# Patient Record
Sex: Female | Born: 1945 | Race: Black or African American | Hispanic: No | Smoking: Never smoker
Health system: Southern US, Community
[De-identification: ages and names within clinical notes are randomized; demographics above are authoritative.]

## PROBLEM LIST (undated history)

## (undated) DIAGNOSIS — J329 Chronic sinusitis, unspecified: Secondary | ICD-10-CM

## (undated) DIAGNOSIS — Z862 Personal history of diseases of the blood and blood-forming organs and certain disorders involving the immune mechanism: Secondary | ICD-10-CM

## (undated) DIAGNOSIS — I493 Ventricular premature depolarization: Secondary | ICD-10-CM

## (undated) DIAGNOSIS — G4485 Primary stabbing headache: Secondary | ICD-10-CM

## (undated) DIAGNOSIS — W19XXXA Unspecified fall, initial encounter: Secondary | ICD-10-CM

## (undated) DIAGNOSIS — E668 Other obesity: Secondary | ICD-10-CM

## (undated) DIAGNOSIS — Z8719 Personal history of other diseases of the digestive system: Secondary | ICD-10-CM

## (undated) DIAGNOSIS — E876 Hypokalemia: Secondary | ICD-10-CM

## (undated) DIAGNOSIS — Z86010 Personal history of colonic polyps: Secondary | ICD-10-CM

## (undated) DIAGNOSIS — K5281 Eosinophilic gastritis or gastroenteritis: Secondary | ICD-10-CM

## (undated) DIAGNOSIS — E0590 Thyrotoxicosis, unspecified without thyrotoxic crisis or storm: Secondary | ICD-10-CM

## (undated) DIAGNOSIS — I491 Atrial premature depolarization: Secondary | ICD-10-CM

## (undated) DIAGNOSIS — R634 Abnormal weight loss: Secondary | ICD-10-CM

## (undated) DIAGNOSIS — M25561 Pain in right knee: Secondary | ICD-10-CM

## (undated) DIAGNOSIS — K219 Gastro-esophageal reflux disease without esophagitis: Secondary | ICD-10-CM

## (undated) DIAGNOSIS — Z8701 Personal history of pneumonia (recurrent): Secondary | ICD-10-CM

## (undated) DIAGNOSIS — Z8659 Personal history of other mental and behavioral disorders: Secondary | ICD-10-CM

## (undated) DIAGNOSIS — J45909 Unspecified asthma, uncomplicated: Secondary | ICD-10-CM

## (undated) DIAGNOSIS — D573 Sickle-cell trait: Secondary | ICD-10-CM

## (undated) DIAGNOSIS — Z8639 Personal history of other endocrine, nutritional and metabolic disease: Secondary | ICD-10-CM

## (undated) DIAGNOSIS — F39 Unspecified mood [affective] disorder: Secondary | ICD-10-CM

## (undated) DIAGNOSIS — K52831 Collagenous colitis: Secondary | ICD-10-CM

## (undated) DIAGNOSIS — Z872 Personal history of diseases of the skin and subcutaneous tissue: Secondary | ICD-10-CM

## (undated) DIAGNOSIS — M8580 Other specified disorders of bone density and structure, unspecified site: Secondary | ICD-10-CM

## (undated) DIAGNOSIS — D571 Sickle-cell disease without crisis: Secondary | ICD-10-CM

## (undated) DIAGNOSIS — N393 Stress incontinence (female) (male): Secondary | ICD-10-CM

## (undated) DIAGNOSIS — E7800 Pure hypercholesterolemia, unspecified: Secondary | ICD-10-CM

## (undated) DIAGNOSIS — R6884 Jaw pain: Secondary | ICD-10-CM

## (undated) DIAGNOSIS — M76899 Other specified enthesopathies of unspecified lower limb, excluding foot: Secondary | ICD-10-CM

## (undated) DIAGNOSIS — H269 Unspecified cataract: Secondary | ICD-10-CM

## (undated) DIAGNOSIS — T7491XA Unspecified adult maltreatment, confirmed, initial encounter: Secondary | ICD-10-CM

## (undated) DIAGNOSIS — Z8249 Family history of ischemic heart disease and other diseases of the circulatory system: Secondary | ICD-10-CM

## (undated) DIAGNOSIS — M12819 Other specific arthropathies, not elsewhere classified, unspecified shoulder: Secondary | ICD-10-CM

## (undated) DIAGNOSIS — R202 Paresthesia of skin: Secondary | ICD-10-CM

## (undated) DIAGNOSIS — Z87898 Personal history of other specified conditions: Secondary | ICD-10-CM

## (undated) DIAGNOSIS — K52832 Lymphocytic colitis: Secondary | ICD-10-CM

## (undated) DIAGNOSIS — M6530 Trigger finger, unspecified finger: Secondary | ICD-10-CM

## (undated) DIAGNOSIS — Z9181 History of falling: Secondary | ICD-10-CM

## (undated) DIAGNOSIS — E785 Hyperlipidemia, unspecified: Secondary | ICD-10-CM

## (undated) DIAGNOSIS — D649 Anemia, unspecified: Secondary | ICD-10-CM

## (undated) DIAGNOSIS — G479 Sleep disorder, unspecified: Secondary | ICD-10-CM

## (undated) DIAGNOSIS — Z8669 Personal history of other diseases of the nervous system and sense organs: Secondary | ICD-10-CM

## (undated) DIAGNOSIS — I499 Cardiac arrhythmia, unspecified: Secondary | ICD-10-CM

## (undated) DIAGNOSIS — H20012 Primary iridocyclitis, left eye: Secondary | ICD-10-CM

## (undated) DIAGNOSIS — F4323 Adjustment disorder with mixed anxiety and depressed mood: Secondary | ICD-10-CM

## (undated) DIAGNOSIS — K449 Diaphragmatic hernia without obstruction or gangrene: Secondary | ICD-10-CM

## (undated) DIAGNOSIS — M25471 Effusion, right ankle: Secondary | ICD-10-CM

## (undated) DIAGNOSIS — F341 Dysthymic disorder: Secondary | ICD-10-CM

## (undated) DIAGNOSIS — M5030 Other cervical disc degeneration, unspecified cervical region: Secondary | ICD-10-CM

## (undated) HISTORY — DX: Atrial premature depolarization: I49.1

## (undated) HISTORY — DX: Sickle-cell disease without crisis: D57.1

## (undated) HISTORY — PX: BREAST SURGERY: SHX581

## (undated) HISTORY — DX: Personal history of other specified conditions: Z87.898

## (undated) HISTORY — DX: Lymphocytic colitis: K52.832

## (undated) HISTORY — DX: Paresthesia of skin: R20.2

## (undated) HISTORY — DX: Abnormal weight loss: R63.4

## (undated) HISTORY — DX: Personal history of diseases of the skin and subcutaneous tissue: Z87.2

## (undated) HISTORY — DX: Thyrotoxicosis, unspecified without thyrotoxic crisis or storm: E05.90

## (undated) HISTORY — DX: Ventricular premature depolarization: I49.3

## (undated) HISTORY — DX: Pure hypercholesterolemia, unspecified: E78.00

## (undated) HISTORY — DX: Personal history of pneumonia (recurrent): Z87.01

## (undated) HISTORY — PX: HERNIA REPAIR: SHX51

## (undated) HISTORY — DX: Primary iridocyclitis, left eye: H20.012

## (undated) HISTORY — DX: Unspecified mood (affective) disorder: F39

## (undated) HISTORY — DX: Sleep disorder, unspecified: G47.9

## (undated) HISTORY — DX: Anemia, unspecified: D64.9

## (undated) HISTORY — DX: Other specified enthesopathies of unspecified lower limb, excluding foot: M76.899

## (undated) HISTORY — DX: Primary stabbing headache: G44.85

## (undated) HISTORY — DX: Personal history of other mental and behavioral disorders: Z86.59

## (undated) HISTORY — PX: CATARACT EXTRACTION: SUR2

## (undated) HISTORY — DX: Adjustment disorder with mixed anxiety and depressed mood: F43.23

## (undated) HISTORY — DX: Jaw pain: R68.84

## (undated) HISTORY — DX: Sickle-cell trait: D57.3

## (undated) HISTORY — DX: Eosinophilic gastritis or gastroenteritis: K52.81

## (undated) HISTORY — DX: Pain in right knee: M25.561

## (undated) HISTORY — PX: TONSILLECTOMY: SUR1361

## (undated) HISTORY — DX: Personal history of other endocrine, nutritional and metabolic disease: Z86.39

## (undated) HISTORY — DX: Hypokalemia: E87.6

## (undated) HISTORY — DX: History of falling: Z91.81

## (undated) HISTORY — DX: Other cervical disc degeneration, unspecified cervical region: M50.30

## (undated) HISTORY — DX: Gastro-esophageal reflux disease without esophagitis: K21.9

## (undated) HISTORY — DX: Effusion, right ankle: M25.471

## (undated) HISTORY — DX: Unspecified asthma, uncomplicated: J45.909

## (undated) HISTORY — DX: Personal history of other diseases of the nervous system and sense organs: Z86.69

## (undated) HISTORY — DX: Personal history of diseases of the blood and blood-forming organs and certain disorders involving the immune mechanism: Z86.2

## (undated) HISTORY — DX: Other specific arthropathies, not elsewhere classified, unspecified shoulder: M12.819

## (undated) HISTORY — DX: Other specified disorders of bone density and structure, unspecified site: M85.80

## (undated) HISTORY — PX: TRIGGER FINGER RELEASE: SHX641

## (undated) HISTORY — DX: Stress incontinence (female) (male): N39.3

## (undated) HISTORY — PX: CARPAL TUNNEL RELEASE: SHX101

## (undated) HISTORY — DX: Trigger finger, unspecified finger: M65.30

## (undated) HISTORY — DX: Cardiac arrhythmia, unspecified: I49.9

## (undated) HISTORY — PX: APPENDECTOMY: SHX54

## (undated) HISTORY — DX: Collagenous colitis: K52.831

## (undated) HISTORY — DX: Other obesity: E66.8

## (undated) HISTORY — DX: Unspecified adult maltreatment, confirmed, initial encounter: T74.91XA

## (undated) HISTORY — DX: Unspecified cataract: H26.9

## (undated) HISTORY — DX: Chronic sinusitis, unspecified: J32.9

## (undated) HISTORY — DX: Dysthymic disorder: F34.1

## (undated) HISTORY — DX: Unspecified fall, initial encounter: W19.XXXA

## (undated) HISTORY — PX: GUM SURGERY: SHX658

## (undated) HISTORY — PX: EYE SURGERY: SHX253

## (undated) HISTORY — DX: Hyperlipidemia, unspecified: E78.5

## (undated) HISTORY — DX: Diaphragmatic hernia without obstruction or gangrene: K44.9

## (undated) HISTORY — DX: Personal history of colonic polyps: Z86.010

---
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
## 1973-09-17 HISTORY — PX: TUBAL LIGATION: SHX77

## 1986-09-17 HISTORY — PX: ABDOMINAL HYSTERECTOMY: SHX81

## 1986-09-17 HISTORY — PX: DILATION AND CURETTAGE OF UTERUS: SHX78

## 1994-07-06 ENCOUNTER — Encounter: Payer: Self-pay | Admitting: Family Medicine

## 1997-07-08 DIAGNOSIS — K449 Diaphragmatic hernia without obstruction or gangrene: Secondary | ICD-10-CM

## 1997-07-08 HISTORY — DX: Diaphragmatic hernia without obstruction or gangrene: K44.9

## 1997-09-17 HISTORY — PX: OTHER SURGICAL HISTORY: SHX169

## 1998-02-10 ENCOUNTER — Emergency Department (HOSPITAL_COMMUNITY): Admission: EM | Admit: 1998-02-10 | Discharge: 1998-02-10 | Payer: Self-pay | Admitting: Emergency Medicine

## 1998-04-18 ENCOUNTER — Ambulatory Visit (HOSPITAL_COMMUNITY): Admission: RE | Admit: 1998-04-18 | Discharge: 1998-04-18 | Payer: Self-pay | Admitting: Gastroenterology

## 1999-03-10 ENCOUNTER — Ambulatory Visit (HOSPITAL_COMMUNITY): Admission: RE | Admit: 1999-03-10 | Discharge: 1999-03-10 | Payer: Self-pay | Admitting: Cardiology

## 1999-03-11 ENCOUNTER — Encounter: Payer: Self-pay | Admitting: Family Medicine

## 1999-03-11 LAB — CONVERTED CEMR LAB
Cholesterol: 259 mg/dL
HDL: 56 mg/dL
LDL Cholesterol: 192 mg/dL
Triglycerides: 55 mg/dL

## 1999-09-29 ENCOUNTER — Encounter: Payer: Self-pay | Admitting: Obstetrics and Gynecology

## 1999-09-29 ENCOUNTER — Ambulatory Visit (HOSPITAL_COMMUNITY): Admission: RE | Admit: 1999-09-29 | Discharge: 1999-09-29 | Payer: Self-pay | Admitting: Obstetrics and Gynecology

## 2000-03-05 ENCOUNTER — Ambulatory Visit (HOSPITAL_BASED_OUTPATIENT_CLINIC_OR_DEPARTMENT_OTHER): Admission: RE | Admit: 2000-03-05 | Discharge: 2000-03-05 | Payer: Self-pay | Admitting: Orthopedic Surgery

## 2000-07-17 ENCOUNTER — Encounter: Payer: Self-pay | Admitting: Family Medicine

## 2000-07-17 LAB — CONVERTED CEMR LAB
HCT: 36.9 %
Hemoglobin: 12.3 g/dL
MCV: 90.6 fL
Platelets: 309 10*3/uL
WBC: 3.5 10*3/uL

## 2000-09-30 ENCOUNTER — Encounter: Payer: Self-pay | Admitting: Obstetrics and Gynecology

## 2000-09-30 ENCOUNTER — Ambulatory Visit (HOSPITAL_COMMUNITY): Admission: RE | Admit: 2000-09-30 | Discharge: 2000-09-30 | Payer: Self-pay | Admitting: Obstetrics and Gynecology

## 2000-10-08 ENCOUNTER — Encounter: Admission: RE | Admit: 2000-10-08 | Discharge: 2000-10-08 | Payer: Self-pay | Admitting: Obstetrics and Gynecology

## 2000-10-08 ENCOUNTER — Encounter: Payer: Self-pay | Admitting: Obstetrics and Gynecology

## 2000-10-17 ENCOUNTER — Encounter: Payer: Self-pay | Admitting: General Surgery

## 2000-10-17 ENCOUNTER — Other Ambulatory Visit: Admission: RE | Admit: 2000-10-17 | Discharge: 2000-10-17 | Payer: Self-pay | Admitting: General Surgery

## 2000-10-17 ENCOUNTER — Encounter: Admission: RE | Admit: 2000-10-17 | Discharge: 2000-10-17 | Payer: Self-pay | Admitting: General Surgery

## 2000-10-27 ENCOUNTER — Emergency Department (HOSPITAL_COMMUNITY): Admission: EM | Admit: 2000-10-27 | Discharge: 2000-10-27 | Payer: Self-pay | Admitting: Emergency Medicine

## 2001-05-09 ENCOUNTER — Ambulatory Visit (HOSPITAL_BASED_OUTPATIENT_CLINIC_OR_DEPARTMENT_OTHER): Admission: RE | Admit: 2001-05-09 | Discharge: 2001-05-09 | Payer: Self-pay | Admitting: Orthopedic Surgery

## 2001-05-09 DIAGNOSIS — M6530 Trigger finger, unspecified finger: Secondary | ICD-10-CM

## 2001-05-09 HISTORY — DX: Trigger finger, unspecified finger: M65.30

## 2001-06-25 ENCOUNTER — Ambulatory Visit (HOSPITAL_COMMUNITY): Admission: RE | Admit: 2001-06-25 | Discharge: 2001-06-25 | Payer: Self-pay | Admitting: Cardiology

## 2001-06-25 ENCOUNTER — Encounter: Payer: Self-pay | Admitting: Cardiology

## 2001-07-04 ENCOUNTER — Encounter: Payer: Self-pay | Admitting: Family Medicine

## 2001-07-04 LAB — CONVERTED CEMR LAB
HCT: 33.9 %
Hemoglobin: 11.8 g/dL
MCV: 91.7 fL
Platelets: 286 10*3/uL
WBC: 4.6 10*3/uL

## 2001-07-09 ENCOUNTER — Ambulatory Visit (HOSPITAL_COMMUNITY): Admission: RE | Admit: 2001-07-09 | Discharge: 2001-07-09 | Payer: Self-pay | Admitting: Cardiology

## 2001-09-17 HISTORY — PX: BIOPSY BREAST: PRO8

## 2001-09-30 ENCOUNTER — Ambulatory Visit (HOSPITAL_COMMUNITY): Admission: RE | Admit: 2001-09-30 | Discharge: 2001-09-30 | Payer: Self-pay | Admitting: Obstetrics and Gynecology

## 2001-09-30 ENCOUNTER — Encounter: Payer: Self-pay | Admitting: Obstetrics and Gynecology

## 2002-02-02 ENCOUNTER — Ambulatory Visit (HOSPITAL_COMMUNITY): Admission: RE | Admit: 2002-02-02 | Discharge: 2002-02-02 | Payer: Self-pay | Admitting: Emergency Medicine

## 2002-02-02 ENCOUNTER — Encounter: Payer: Self-pay | Admitting: Emergency Medicine

## 2002-08-18 ENCOUNTER — Encounter: Admission: RE | Admit: 2002-08-18 | Discharge: 2002-08-18 | Payer: Self-pay | Admitting: Emergency Medicine

## 2002-08-18 ENCOUNTER — Encounter: Payer: Self-pay | Admitting: Emergency Medicine

## 2002-10-01 ENCOUNTER — Ambulatory Visit (HOSPITAL_COMMUNITY): Admission: RE | Admit: 2002-10-01 | Discharge: 2002-10-01 | Payer: Self-pay | Admitting: Gastroenterology

## 2002-10-07 ENCOUNTER — Ambulatory Visit (HOSPITAL_COMMUNITY): Admission: RE | Admit: 2002-10-07 | Discharge: 2002-10-07 | Payer: Self-pay | Admitting: Obstetrics and Gynecology

## 2002-10-07 ENCOUNTER — Encounter: Payer: Self-pay | Admitting: Obstetrics and Gynecology

## 2002-10-28 ENCOUNTER — Encounter: Payer: Self-pay | Admitting: Gastroenterology

## 2002-10-28 ENCOUNTER — Encounter: Admission: RE | Admit: 2002-10-28 | Discharge: 2002-10-28 | Payer: Self-pay | Admitting: Gastroenterology

## 2003-04-29 ENCOUNTER — Encounter: Payer: Self-pay | Admitting: Gastroenterology

## 2003-04-29 ENCOUNTER — Encounter: Admission: RE | Admit: 2003-04-29 | Discharge: 2003-04-29 | Payer: Self-pay | Admitting: Gastroenterology

## 2003-10-11 ENCOUNTER — Ambulatory Visit (HOSPITAL_COMMUNITY): Admission: RE | Admit: 2003-10-11 | Discharge: 2003-10-11 | Payer: Self-pay | Admitting: Obstetrics and Gynecology

## 2004-07-19 ENCOUNTER — Encounter: Admission: RE | Admit: 2004-07-19 | Discharge: 2004-07-19 | Payer: Self-pay | Admitting: Emergency Medicine

## 2004-10-11 ENCOUNTER — Ambulatory Visit (HOSPITAL_COMMUNITY): Admission: RE | Admit: 2004-10-11 | Discharge: 2004-10-11 | Payer: Self-pay | Admitting: Obstetrics and Gynecology

## 2005-09-28 ENCOUNTER — Encounter: Admission: RE | Admit: 2005-09-28 | Discharge: 2005-09-28 | Payer: Self-pay | Admitting: Emergency Medicine

## 2005-10-11 DIAGNOSIS — M12819 Other specific arthropathies, not elsewhere classified, unspecified shoulder: Secondary | ICD-10-CM

## 2005-10-11 HISTORY — DX: Other specific arthropathies, not elsewhere classified, unspecified shoulder: M12.819

## 2005-10-18 ENCOUNTER — Ambulatory Visit (HOSPITAL_COMMUNITY): Admission: RE | Admit: 2005-10-18 | Discharge: 2005-10-18 | Payer: Self-pay | Admitting: Obstetrics and Gynecology

## 2006-05-27 ENCOUNTER — Encounter: Admission: RE | Admit: 2006-05-27 | Discharge: 2006-05-27 | Payer: Self-pay | Admitting: General Surgery

## 2006-05-30 ENCOUNTER — Ambulatory Visit (HOSPITAL_BASED_OUTPATIENT_CLINIC_OR_DEPARTMENT_OTHER): Admission: RE | Admit: 2006-05-30 | Discharge: 2006-05-30 | Payer: Self-pay | Admitting: General Surgery

## 2006-09-17 HISTORY — PX: COLONOSCOPY W/ BIOPSIES: SHX1374

## 2006-10-08 ENCOUNTER — Encounter: Payer: Self-pay | Admitting: Family Medicine

## 2006-10-08 LAB — CONVERTED CEMR LAB
ALT: 20 units/L
AST: 23 units/L
Albumin: 3.8 g/dL
Alkaline Phosphatase: 78 units/L
BUN: 8 mg/dL
Basophils Absolute: 0 10*3/uL
Basophils Relative: 0 %
Bilirubin, Direct: 0.1 mg/dL
CO2: 27 meq/L
Chloride: 105 meq/L
Creatinine, Ser: 0.41 mg/dL
Eosinophils Absolute: 0.1 10*3/uL
Eosinophils Relative: 3 %
Glucose, Bld: 78 mg/dL
HCT: 31.4 %
Hemoglobin: 10.8 g/dL
Indirect Bilirubin: 0.6 mg/dL
Lymphocytes Relative: 36 %
Lymphs Abs: 1.4 10*3/uL
MCV: 92.1 fL
Monocytes Absolute: 0.4 10*3/uL
Monocytes Relative: 11 %
Neutro Abs: 1.9 10*3/uL
Neutrophils Relative %: 50 %
Platelets: 327 10*3/uL
Potassium: 2.5 meq/L
RBC: 3.41 M/uL
RDW: 13.3 %
Sodium: 146 meq/L
TSH: 0.425 microintl units/mL
Total Bilirubin: 0.7 mg/dL
Total Protein: 6.6 g/dL
WBC: 3.7 10*3/uL

## 2006-10-11 ENCOUNTER — Encounter (INDEPENDENT_AMBULATORY_CARE_PROVIDER_SITE_OTHER): Payer: Self-pay | Admitting: *Deleted

## 2006-10-11 ENCOUNTER — Ambulatory Visit (HOSPITAL_COMMUNITY): Admission: RE | Admit: 2006-10-11 | Discharge: 2006-10-12 | Payer: Self-pay | Admitting: Gastroenterology

## 2006-10-11 DIAGNOSIS — Z8719 Personal history of other diseases of the digestive system: Secondary | ICD-10-CM | POA: Insufficient documentation

## 2006-10-22 ENCOUNTER — Ambulatory Visit (HOSPITAL_COMMUNITY): Admission: RE | Admit: 2006-10-22 | Discharge: 2006-10-22 | Payer: Self-pay | Admitting: Obstetrics and Gynecology

## 2006-10-30 ENCOUNTER — Encounter: Admission: RE | Admit: 2006-10-30 | Discharge: 2006-10-30 | Payer: Self-pay | Admitting: Obstetrics and Gynecology

## 2006-11-11 ENCOUNTER — Ambulatory Visit (HOSPITAL_COMMUNITY): Admission: RE | Admit: 2006-11-11 | Discharge: 2006-11-11 | Payer: Self-pay | Admitting: Emergency Medicine

## 2006-12-17 HISTORY — PX: CARDIAC CATHETERIZATION: SHX172

## 2006-12-26 ENCOUNTER — Ambulatory Visit (HOSPITAL_COMMUNITY): Admission: RE | Admit: 2006-12-26 | Discharge: 2006-12-26 | Payer: Self-pay | Admitting: Cardiology

## 2006-12-30 ENCOUNTER — Ambulatory Visit (HOSPITAL_COMMUNITY): Admission: RE | Admit: 2006-12-30 | Discharge: 2006-12-30 | Payer: Self-pay | Admitting: Cardiology

## 2007-01-01 ENCOUNTER — Ambulatory Visit (HOSPITAL_COMMUNITY): Admission: RE | Admit: 2007-01-01 | Discharge: 2007-01-01 | Payer: Self-pay | Admitting: Cardiology

## 2007-02-23 ENCOUNTER — Emergency Department (HOSPITAL_COMMUNITY): Admission: EM | Admit: 2007-02-23 | Discharge: 2007-02-23 | Payer: Self-pay | Admitting: Emergency Medicine

## 2007-09-24 ENCOUNTER — Ambulatory Visit (HOSPITAL_COMMUNITY): Admission: RE | Admit: 2007-09-24 | Discharge: 2007-09-24 | Payer: Self-pay | Admitting: Emergency Medicine

## 2007-10-31 ENCOUNTER — Ambulatory Visit (HOSPITAL_COMMUNITY): Admission: RE | Admit: 2007-10-31 | Discharge: 2007-10-31 | Payer: Self-pay | Admitting: Obstetrics and Gynecology

## 2007-11-01 ENCOUNTER — Emergency Department (HOSPITAL_COMMUNITY): Admission: EM | Admit: 2007-11-01 | Discharge: 2007-11-01 | Payer: Self-pay | Admitting: Emergency Medicine

## 2007-11-08 ENCOUNTER — Ambulatory Visit (HOSPITAL_COMMUNITY): Admission: RE | Admit: 2007-11-08 | Discharge: 2007-11-08 | Payer: Self-pay | Admitting: Emergency Medicine

## 2007-11-27 DIAGNOSIS — M5030 Other cervical disc degeneration, unspecified cervical region: Secondary | ICD-10-CM

## 2007-11-27 DIAGNOSIS — M47812 Spondylosis without myelopathy or radiculopathy, cervical region: Secondary | ICD-10-CM | POA: Insufficient documentation

## 2007-11-27 DIAGNOSIS — M47817 Spondylosis without myelopathy or radiculopathy, lumbosacral region: Secondary | ICD-10-CM | POA: Insufficient documentation

## 2007-11-27 HISTORY — DX: Other cervical disc degeneration, unspecified cervical region: M50.30

## 2007-12-18 ENCOUNTER — Encounter: Admission: RE | Admit: 2007-12-18 | Discharge: 2008-02-05 | Payer: Self-pay | Admitting: Neurosurgery

## 2008-06-08 DIAGNOSIS — Z87898 Personal history of other specified conditions: Secondary | ICD-10-CM

## 2008-06-08 HISTORY — DX: Personal history of other specified conditions: Z87.898

## 2008-11-26 ENCOUNTER — Ambulatory Visit (HOSPITAL_BASED_OUTPATIENT_CLINIC_OR_DEPARTMENT_OTHER): Admission: RE | Admit: 2008-11-26 | Discharge: 2008-11-26 | Payer: Self-pay | Admitting: Orthopedic Surgery

## 2009-01-19 ENCOUNTER — Ambulatory Visit (HOSPITAL_COMMUNITY): Admission: RE | Admit: 2009-01-19 | Discharge: 2009-01-19 | Payer: Self-pay | Admitting: Obstetrics and Gynecology

## 2009-03-01 ENCOUNTER — Encounter: Payer: Self-pay | Admitting: Family Medicine

## 2009-03-01 LAB — CONVERTED CEMR LAB: Pap Smear: NORMAL

## 2009-10-25 ENCOUNTER — Ambulatory Visit (HOSPITAL_BASED_OUTPATIENT_CLINIC_OR_DEPARTMENT_OTHER): Admission: RE | Admit: 2009-10-25 | Discharge: 2009-10-25 | Payer: Self-pay | Admitting: Orthopedic Surgery

## 2009-11-17 ENCOUNTER — Encounter: Payer: Self-pay | Admitting: Family Medicine

## 2009-11-17 DIAGNOSIS — E668 Other obesity: Secondary | ICD-10-CM

## 2009-11-17 DIAGNOSIS — Z872 Personal history of diseases of the skin and subcutaneous tissue: Secondary | ICD-10-CM

## 2009-11-17 DIAGNOSIS — Z8659 Personal history of other mental and behavioral disorders: Secondary | ICD-10-CM | POA: Insufficient documentation

## 2009-11-17 DIAGNOSIS — I493 Ventricular premature depolarization: Secondary | ICD-10-CM

## 2009-11-17 DIAGNOSIS — J329 Chronic sinusitis, unspecified: Secondary | ICD-10-CM

## 2009-11-17 DIAGNOSIS — Z8669 Personal history of other diseases of the nervous system and sense organs: Secondary | ICD-10-CM

## 2009-11-17 DIAGNOSIS — R002 Palpitations: Secondary | ICD-10-CM | POA: Insufficient documentation

## 2009-11-17 DIAGNOSIS — M76899 Other specified enthesopathies of unspecified lower limb, excluding foot: Secondary | ICD-10-CM

## 2009-11-17 DIAGNOSIS — Z862 Personal history of diseases of the blood and blood-forming organs and certain disorders involving the immune mechanism: Secondary | ICD-10-CM | POA: Insufficient documentation

## 2009-11-17 DIAGNOSIS — Z8639 Personal history of other endocrine, nutritional and metabolic disease: Secondary | ICD-10-CM | POA: Insufficient documentation

## 2009-11-17 DIAGNOSIS — K219 Gastro-esophageal reflux disease without esophagitis: Secondary | ICD-10-CM | POA: Insufficient documentation

## 2009-11-17 DIAGNOSIS — E669 Obesity, unspecified: Secondary | ICD-10-CM

## 2009-11-17 HISTORY — DX: Chronic sinusitis, unspecified: J32.9

## 2009-11-17 HISTORY — DX: Ventricular premature depolarization: I49.3

## 2009-11-17 HISTORY — DX: Other specified enthesopathies of unspecified lower limb, excluding foot: M76.899

## 2009-11-17 HISTORY — DX: Other obesity: E66.8

## 2009-11-17 HISTORY — DX: Obesity, unspecified: E66.9

## 2009-11-17 HISTORY — DX: Personal history of other mental and behavioral disorders: Z86.59

## 2009-11-17 HISTORY — DX: Personal history of diseases of the blood and blood-forming organs and certain disorders involving the immune mechanism: Z86.2

## 2009-11-17 HISTORY — DX: Personal history of diseases of the blood and blood-forming organs and certain disorders involving the immune mechanism: Z86.39

## 2009-11-17 HISTORY — DX: Gastro-esophageal reflux disease without esophagitis: K21.9

## 2009-11-17 HISTORY — DX: Personal history of diseases of the skin and subcutaneous tissue: Z87.2

## 2009-11-17 HISTORY — DX: Personal history of other diseases of the nervous system and sense organs: Z86.69

## 2009-11-21 ENCOUNTER — Ambulatory Visit: Payer: Self-pay | Admitting: Family Medicine

## 2009-11-21 DIAGNOSIS — Z862 Personal history of diseases of the blood and blood-forming organs and certain disorders involving the immune mechanism: Secondary | ICD-10-CM | POA: Insufficient documentation

## 2009-11-21 HISTORY — DX: Personal history of diseases of the blood and blood-forming organs and certain disorders involving the immune mechanism: Z86.2

## 2009-11-21 LAB — CONVERTED CEMR LAB
BUN: 10 mg/dL (ref 6–23)
CO2: 24 meq/L (ref 19–32)
Calcium: 8.9 mg/dL (ref 8.4–10.5)
Chloride: 106 meq/L (ref 96–112)
Creatinine, Ser: 0.57 mg/dL (ref 0.40–1.20)
Glucose, Bld: 88 mg/dL (ref 70–99)
HCT: 35.8 % — ABNORMAL LOW (ref 36.0–46.0)
Hemoglobin: 11.8 g/dL — ABNORMAL LOW (ref 12.0–15.0)
MCHC: 33 g/dL (ref 30.0–36.0)
MCV: 92.7 fL (ref 78.0–100.0)
Platelets: 325 10*3/uL (ref 150–400)
Potassium: 4.1 meq/L (ref 3.5–5.3)
RBC: 3.86 M/uL — ABNORMAL LOW (ref 3.87–5.11)
RDW: 12.8 % (ref 11.5–15.5)
Sodium: 140 meq/L (ref 135–145)
WBC: 4.2 10*3/uL (ref 4.0–10.5)

## 2009-11-22 ENCOUNTER — Encounter: Payer: Self-pay | Admitting: Family Medicine

## 2009-11-22 DIAGNOSIS — R519 Headache, unspecified: Secondary | ICD-10-CM | POA: Insufficient documentation

## 2009-11-22 DIAGNOSIS — E669 Obesity, unspecified: Secondary | ICD-10-CM | POA: Insufficient documentation

## 2009-11-22 DIAGNOSIS — R51 Headache: Secondary | ICD-10-CM | POA: Insufficient documentation

## 2010-01-23 ENCOUNTER — Ambulatory Visit (HOSPITAL_COMMUNITY): Admission: RE | Admit: 2010-01-23 | Discharge: 2010-01-23 | Payer: Self-pay | Admitting: Obstetrics and Gynecology

## 2010-03-21 ENCOUNTER — Encounter: Payer: Self-pay | Admitting: Family Medicine

## 2010-05-01 ENCOUNTER — Ambulatory Visit: Payer: Self-pay | Admitting: Family Medicine

## 2010-10-06 ENCOUNTER — Other Ambulatory Visit: Payer: Self-pay | Admitting: Obstetrics and Gynecology

## 2010-10-06 DIAGNOSIS — Z139 Encounter for screening, unspecified: Secondary | ICD-10-CM

## 2010-10-06 DIAGNOSIS — Z1231 Encounter for screening mammogram for malignant neoplasm of breast: Secondary | ICD-10-CM

## 2010-10-08 ENCOUNTER — Encounter: Payer: Self-pay | Admitting: Emergency Medicine

## 2010-10-08 ENCOUNTER — Encounter: Payer: Self-pay | Admitting: Obstetrics and Gynecology

## 2010-10-09 ENCOUNTER — Encounter: Payer: Self-pay | Admitting: Obstetrics and Gynecology

## 2010-10-19 NOTE — Assessment & Plan Note (Signed)
Summary: [name]   Vital Signs:  Patient profile:   65 year old female Height:      64 inches Weight:      180.2 pounds BMI:     31.04 Temp:     98.6 degrees F oral Pulse rate:   72 / minute BP sitting:   118 / 74  (left arm) Cuff size:   regular  Vitals Entered By: Garen Grams LPN (May 01, 2010 3:07 PM) CC: f/u Is Patient Diabetic? No Pain Assessment Patient in pain? no        CC:  f/u.  History of Present Illness: Statin induced myalgias Patient stopped Crestor last week under direction from cardilogist, Dr Garnette Scheuermann, b/c of generalized muscle aching.  The aching has improved at least 50% since stopping Crestor.  Pt believes this is the second time she has developed myalgias on a statin therapy.  SH: Mrs Alas retired from  [employer] system last week.  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     Habits & Providers  Alcohol-Tobacco-Diet     Alcohol drinks/day: 0     Tobacco Status: never  Current Medications (verified): 1)  Atenolol 25 Mg Tabs (Atenolol) .Marland Kitchen.. 1 Tablet By Mouth Once A Day 2)  Klor-Con M20 20 Meq Cr-Tabs (Potassium Chloride Crys Cr) .... 2 Tablets By Mouth in Morning and 2 Tablets By Mouth 3)  Aspir-Trin 325 Mg Tbec (Aspirin) .Marland Kitchen.. 1 Tablets By Mouth Daily 4)  Protonix 40 Mg Tbec (Pantoprazole Sodium) .Marland Kitchen.. 1 Tablet By Mouth As Needed 5)  Niaspan 500 Mg Cr-Tabs (Niacin (Antihyperlipidemic)) .Marland Kitchen.. 1 Tablet At Bedtime  Allergies (verified): No Known Drug Allergies  Past History:  Past medical history reviewed for relevance to current acute and chronic problems. Past surgical history reviewed for relevance to current acute and chronic problems.  Past Medical History: Reviewed history from 11/17/2009 and no changes required. Coronary Angiography (2002 [facility]): No coronary Artery Disease. Coronary Angiography (01/01/2007, Chanda Busing MD): patent coronary arteries. Normal LV function. 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  No masses or polyps.  No evidence of diverticulosis. Normal terminal ilium.  Tissue Transglutaminase Antibody (IgA) (10/17/2006) = <3 U/Ml ( normal) Hospitalization (1/25 - 10/12/06, Dr Isidor Holts service): Diarrhea and profound hypokalemia  Past Surgical History: Reviewed history from 11/17/2009 and no changes required. Releast of stenosing tenosynovitis of thumbs  D&C 1988 Hysterectomy (partial) 1988 BTL 1975 Eye surgery x2 Tonsilectomy Carpel Tunnel Surgeries (both wrists) Umbilical Hernia Surgery x 2 Oral gum surgery   Image-guided Breast Biopsy of maommographic nodule (09/2001) Gallbladder U/S with CCK-injection (1999): normal UGI barium (1999) normal  Social History: Lives with her husband, Cheral Almas 3 grown children 5 brothers and 4 sisters Occupation: retired, previously an Manufacturing engineer at [employer] No pets Education: college Heterosexual Owns a car Patient's Cell phone [phone] Husband's cell phone [phone] Renting home Never Smoked Drug use-no Alcohol use: 1-2 glasses wine per day Exercise: Yes, 5 days a week, walking, wieght lifting and dance Always wears seatbelts No hx of STD Sun exposure: rarely  Review of Systems  The patient denies weight loss, chest pain, peripheral edema, prolonged cough, melena, hematochezia, and severe indigestion/heartburn.    Physical Exam  General:  alert and well-developed.  obese. NAD Ears:  Normal TM's bilaterally Neck:  supple, no masses, and no thyromegaly.  No carotid bruits.  Lungs:  Normal respiratory effort, chest expands symmetrically. Lungs are clear to auscultation, no crackles or wheezes. Heart:  normal rate, regular rhythm, no murmur, no gallop, no rub, and no JVD.   Abdomen:  soft, non-tender, normal bowel sounds, no distention, no masses, no hepatomegaly, and no splenomegaly.   Pulses:  R dorsalis  pedis normal and L dorsalis pedis normal.   Extremities:  No peripheral edema Psych:  memory intact for recent and remote, normally interactive, good eye contact, not anxious appearing, and not depressed appearing.     Impression & Recommendations:  Problem # 1:  HYPERLIPIDEMIA (ICD-272.4) Assessment Comment Only  Intolerant of Statin (sencond episode). Switched to Niaspan by Dr Garnette Scheuermann (Card) Her updated medication list for this problem includes:    Niaspan 500 Mg Cr-tabs (Niacin (antihyperlipidemic)) .Marland Kitchen... 1 tablet at bedtime  Orders: [facility]- Est Level  2 ([address]109)  Problem # 2:  FAMILY HISTORY BREAST CANCER 1ST DEGREE RELATIVE <50 (ICD-V16.3) Assessment: Comment Only Up to date on screening mammography.  She gets her mammograms at [facility].   Problem # 3:  Screening Breast Cancer (ICD-V76.10) Normal Mammogram. Return for follow up Mammogram in 1 year  Complete Medication List: 1)  Atenolol 25 Mg Tabs (Atenolol) .Marland Kitchen.. 1 tablet by mouth once a day 2)  Klor-con M20 20 Meq Cr-tabs (Potassium chloride crys cr) .... 2 tablets by mouth in morning and 2 tablets by mouth 3)  Aspir-trin 325 Mg Tbec (Aspirin) .Marland Kitchen.. 1 tablets by mouth daily 4)  Protonix 40 Mg Tbec (Pantoprazole sodium) .Marland Kitchen.. 1 tablet by mouth as needed 5)  Niaspan 500 Mg Cr-tabs (Niacin (antihyperlipidemic)) .Marland Kitchen.. 1 tablet at bedtime  Hemoccult Next Due:  Not Indicated Last PAP:  normal (03/01/2009 10:41:28 AM) PAP Next Due:  3 yr Last Mammogram:  normal (01/19/2010 10:04:28 AM) Mammogram Next Due:  1 yr   Prevention & Chronic Care Immunizations   Influenza vaccine: Historical  (08/16/2009)   Influenza vaccine due: 08/16/2010    Tetanus booster: 05/04/2008: Tdap   Tetanus booster due: 05/04/2018    Pneumococcal vaccine: Not documented    H. zoster vaccine: 11/28/2005: Zostavax  Colorectal Screening   Hemoccult: Not documented   Hemoccult due: Not Indicated    Colonoscopy: abnormal  (10/11/2006)    Colonoscopy due: 10/11/2016  Other Screening   Pap smear: normal  (03/01/2009)   Pap smear due: 03/01/2012    Mammogram: Normal  (05/01/2010)   Mammogram due: 01/20/2011    DXA bone density scan: Not documented   Smoking status: never  (05/01/2010)  Lipids   Total Cholesterol: 259  (03/11/1999)   LDL: 192  (03/11/1999)   LDL Direct: Not documented   HDL: 56  (03/11/1999)   Triglycerides: 55  (03/11/1999)    SGOT (AST): 23  (10/08/2006)   SGPT (ALT): 20  (10/08/2006)   Alkaline phosphatase: 78  (10/08/2006)   Total bilirubin: 0.7  (10/08/2006)  Self-Management Support :   Personal Goals (by the next clinic visit) :      Personal LDL goal: 130  (11/21/2009)    Lipid self-management support: Written self-care plan, Education handout, Resources for patients handout  (11/21/2009)     Appended Document: [facility]    Clinical Lists Changes  Observations: Added new observation of SOCIAL HX: Lives with her husband, Cheral Almas 3 grown daughters 5 brothers and 4 sisters Occupation: retired, 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 diagnosesd at age 26 Coronary Artery Disease in Father, one brother  and in one sister Cerebral Aneurysm in one brother Stroke/TIA in Father,two brothers and one sister Asthma(+) in family Hyperlipidemia in family Hypertension in family Stomach cancer in family No FH of colon,  ovarian, uterine, prostate cancer  (05/11/2010 11:01) Added new observation of PAST SURG HX: Releast of stenosing tenosynovitis of thumbs  D&C 1988 Hysterectomy (partial) 1988 BTL 1975 Eye surgery x2 Tonsilectomy Carpel Tunnel Surgeries (both wrists) Umbilical Hernia Surgery x 2 Oral gum surgery  Image-guided Breast Biopsy of maommographic nodule (09/2001) Gallbladder U/S with CCK-injection (1999): normal UGI barium (1999) normal Carotid artery Dopplers (2010) was normal.  (05/11/2010 11:01) Added new observation of US CAROTID: normal:   (08/31/2009 11:03)       Past Surgical History:    Releast of stenosing tenosynovitis of thumbs     D&C 1988    Hysterectomy (partial) 1988    BTL 1975    Eye surgery x2    Tonsilectomy    Carpel Tunnel Surgeries (both wrists)    Umbilical Hernia Surgery x 2    Oral gum surgery        Image-guided Breast Biopsy of maommographic nodule (09/2001)    Gallbladder U/S with CCK-injection (1999): normal    UGI barium (1999) normal    Carotid artery Dopplers (2010) was normal.     Carotid Doppler  Procedure date:  08/31/2009  Findings:      normal:     Carotid Doppler  Procedure date:  08/31/2009  Findings:      normal:     Family History:    Father died age 13 with Acute MI        Family History Diabetes 1st degree relative: Father    Family History Breast cancer 1st degree relative: Mother died age 10 yearsw-old with breast cancer. diagnosesd at age 62    Coronary Artery Disease in Father, one brother  and in one sister    Cerebral Aneurysm in one brother    Stroke/TIA in Father,two brothers and one sister    Asthma(+) in family    Hyperlipidemia in family    Hypertension in family    Stomach cancer in family    No FH of colon, ovarian, uterine, prostate cancer  Social History:    Lives with her husband, Cheral Almas    3 grown daughters    5 brothers and 4 sisters    Occupation: retired, previously an Manufacturing engineer  at [employer]    No pets    Education: college    Heterosexual    Owns a car    Patient's Cell phone [phone]    Husband's cell phone [phone]    Renting home    Never Smoked    Drug use-no    Alcohol use: 1-2 glasses wine per day    Exercise: Yes, 5 days a week, walking, wieght lifting and dance    Always wears seatbelts    No hx of STD    Sun  exposure: rarely   Appended Document: Lab results from [facility] 05/03/10

## 2010-10-19 NOTE — Miscellaneous (Signed)
Summary: ROI  ROI   Imported By: Bradly Bienenstock 05/02/2010 16:41:34  _____________________________________________________________________  External Attachment:    Type:   Image     Comment:   External Document

## 2010-10-19 NOTE — Letter (Signed)
Summary: Lab results  [facility]  [address]   [place], [place] [address]   Phone: [phone]  Fax: [phone]    11/22/2009 MRN: [id number]  [address] [place], [place]  [address]  Dear Ms. SNOWBALL,  Your blood work from March 7th, 2011 shows normal potassium, electrolytes, glucose, and hemoglobin.  Remember to discuss getting your cholesterol checked with Dr Garnette Scheuermann and talk with him about whether you should be taking the Crestor medication to lower your cholesterol.   Sincerely,   Tawanna Cooler Wynona Duhamel MD [facility]  Appended Document: Lab results mailed.

## 2010-10-19 NOTE — Letter (Signed)
Summary: Appointment reminder letter  [facility]  [address]   [place], [place] [address]   Phone: [phone]  Fax: [phone]    03/21/2010 MRN: [id number]  [address] [place], [place]  [address]  Dear Ms. Ike Bene,  Just a reminder to call and schedule an appointment with Dr Mahina Salatino at the [facility] for August.  The appointment is for a check up.   Sincerely,   Tawanna Cooler Karman Veney MD [facility]  Appended Document: Appointment reminder letter mailed

## 2010-10-19 NOTE — Assessment & Plan Note (Signed)
Summary: np,df   Vital Signs:  Patient profile:   65 year old female Height:      64 inches Weight:      183 pounds BMI:     31.53 BSA:     1.89 Temp:     98.5 degrees F Pulse rate:   63 / minute BP sitting:   118 / 75  Vitals Entered By: Jone Baseman CMA (November 21, 2009 2:36 PM) CC: NEW PATIENT Is Patient Diabetic? No Pain Assessment Patient in pain? no        CC:  NEW PATIENT.  History of Present Illness: Current Problems:    ANEMIA, MILD, HX OF (ICD-V12.3) No DOE, no SOB, No near syncope or syncope. No excess fatigue.   Hx of PALPITATIONS, CHRONIC (ICD-785.1) & PREMATURE VENTRICULAR CONTRACTIONS (ICD-427.69) Taking atenolol 25 mg daily.  No sycope, dizziness, shortness or breath  Hx of CARPAL TUNNEL SYNDROME, BILATERAL, HX OF (ICD-V12.49) Recent carpel tunnel release about one month ago by Dr Teressa Senter.   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 She is not currently taking the Crestor. She palns to discuss whether or not to take it with her new cardiologist, Dr Garnette Scheuermann this week.  She will get her fasting Lipid panel with Dr Michaelle Copas office.   GASTROESOPHAGEAL REFLUX DISEASE (ICD-530.81) Currently without symptoms, but would like to have Nexium availble should symptoms develop     Hx of Asthma Had wheezing 1-2 months ago with URI iilness.  No current SOB, cough, wheezing.  She would like to have Albuterol MDI available for when she has wheezing with URIs.    Habits & Providers  Alcohol-Tobacco-Diet     Alcohol drinks/day: 1     Alcohol Counseling: not indicated; use of alcohol is not excessive or problematic  Alcohol type: wine     Tobacco Status: never  Exercise-Depression-Behavior     Does Patient Exercise: yes     Exercise Counseling: not indicated; exercise is adequate     Type of exercise: walking, weights, dance     Times/week: 5     Gynecologist: Dr Genice Rouge  Current Problems (verified): 1)  Family History Breast Cancer 1st Degree Relative <50  (ICD-V16.3) 2)  Family History Diabetes 1st Degree Relative  (ICD-V18.0) 3)  Hx of Trigger Finger, Right Thumb  (ICD-727.03) 4)  Arthritis, 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) 5)  Shoulder Impingement Syndrome, Left, Chronic  (ICD-726.2) 6)  Anal Fissure, Hx of  (ICD-V13.3) 7)  Hx of Palpitations, Chronic  (ICD-785.1) 8)  Premature Ventricular Contractions  (ICD-427.69) 9)  Hx of Closed Fracture of Lateral Malleolus, Right  (ICD-824.2) 10)  Lumbar Facet Arthropathy, Esp L3-l4, Nerverootimping L4 & L3  (ICD-721.3) 11)  Spondylosis, Lumbar  (ICD-721.3) 12)  Degenerative Disc Disease, Cervical Spine  (ICD-722.4) 13)  Spondylosis, Cervical  (ICD-721.0) 14)  Hx of Carpal Tunnel Syndrome, Bilateral, Hx of  (ICD-V12.49) 15)  Hx of Trochanteric Bursitis, Right  (ICD-726.5) 16)  Hx of Sinusitis  (ICD-473.9) 17)  Gastroesophageal Reflux Disease  (ICD-530.81) 18)  Lymphocytic Colitis, 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 1 Tablet By Mouth Once A Day 2)  Klor-Con M20 20 Meq Cr-Tabs (Potassium Chloride Crys Cr) .... 2 Tablets By Mouth in Morning and 2 Tablets By Mouth 3)  Aspir-Trin  325 Mg Tbec (Aspirin) .Marland Kitchen.. 1 Tablets By Mouth Daily 4)  Protonix 40 Mg Tbec (Pantoprazole Sodium) .Marland Kitchen.. 1 Tablet By Mouth As Needed 5)  Proair Hfa 108 (90 Base) Mcg/act Aers (Albuterol Sulfate) .... 2 Sprays Inhaled As Needed Every 4 To 6 Hours For Wheezing or Cough of Asthma  Allergies (verified): No Known Drug Allergies  Past History:  Past Medical History: Last updated: 12-12-09 Coronary Angiography (2002 [facility]): No coronary Artery Disease. Coronary Angiography (01/01/2007, Chanda Busing MD): patent coronary arteries. Normal LV function. 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  No masses or polyps.  No evidence of diverticulosis. Normal terminal ilium.  Tissue Transglutaminase Antibody (IgA) (10/17/2006) = <3 U/Ml ( normal) Hospitalization (1/25 - 10/12/06, Dr Isidor Holts service): Diarrhea and profound hypokalemia  Past Surgical History: Last updated: 12-12-09 Releast of stenosing tenosynovitis of thumbs  D&C 1988 Hysterectomy (partial) 1988 BTL 1975 Eye surgery x2 Tonsilectomy Carpel Tunnel Surgeries (both wrists) Umbilical Hernia Surgery x 2 Oral gum surgery   Image-guided Breast Biopsy of maommographic nodule (09/2001) Gallbladder U/S with CCK-injection (1999): normal UGI barium (1999) normal  Family History: Last updated: Dec 12, 2009 Father died age 65 with Acute MI  Family History Diabetes 1st degree relative: Father Family History Breast cancer 1st degree relative: Mother died age 59 yearsw-old with breast cancer. diagnosesd at age 49 Coronary Artery Disease in Father, one brother and in one sister Cerebral Aneurysm in one brother Stroke/TIA in Father, one brother and one sister Asthma(+) in family Hyperlipidemia in family Hypertension in family Stomach cancer in family No FH of colon, ovarian, uterine, prostate cancer  Social  History: Last updated: 11/21/2009 Lives with her husband, Cheral Almas 3 grown children 5 brothers and 4 sisters Occupation:EKG technician at [employer] No pets Education: college Heterosexual Owns a car Patient's Cell phone [phone] Husband's cell phone [phone] Renting home Never Smoked Drug use-no Alcohol use: 1-2 glasses wine per day Exercise: Yes, 5 days a week, walking, wieght lifting and dance Always wears seatbelts No hx of STD Sun exposure: rarely  Risk Factors: Alcohol Use: 1 (11/21/2009) Exercise: yes (11/21/2009)  Risk Factors: Smoking Status: never (11/21/2009)  Family History: Reviewed history from 12/12/2009 and no changes required. Father died age 4 with Acute MI  Family History Diabetes 1st degree relative: Father Family History Breast cancer 1st degree relative: Mother died age 71 yearsw-old with breast cancer. diagnosesd at age 76 Coronary Artery Disease in Father, one brother and in one sister Cerebral Aneurysm in one brother Stroke/TIA in Father, one brother and one sister Asthma(+) in family Hyperlipidemia in family Hypertension in family Stomach cancer in family No FH of colon, ovarian, uterine, prostate cancer  Social History: Reviewed history from 11/21/2009 and no changes required. Lives with her husband, Cheral Almas 3 grown children 5 brothers and 4 sisters Occupation:EKG technician at  [employer] No pets Education: college Heterosexual Owns a car Patient's Cell phone [phone] Husband's cell phone [phone] Renting home Never Smoked Drug use-no Alcohol use: 1-2 glasses wine per day Exercise: Yes, 5 days a week, walking, wieght lifting and dance Always wears seatbelts No hx of STD Sun exposure: rarely Smoking Status:  never Does Patient Exercise:  yes   Impression & Recommendations:  Problem # 1:  HYPERLIPIDEMIA (ICD-272.4) Assessment Comment Only Had taken Crestor 20 mg daily, then  decreased to twice a wek per Dr Truett Perna recommendations. She is not currently taking the Crestor. She palns to discuss whether or not to take it with her new cardiologist, Dr Garnette Scheuermann this week.  She will get her fasting Lipid panel with Dr Michaelle Copas office.P  Problem # 2:  Hx of PALPITATIONS, CHRONIC (ICD-785.1) Assessment: Comment Only Adequate control. Tolerating medication. No new organ damage. Plan to continue current medication.  Her updated medication list for this problem includes:    Atenolol 25 Mg Tabs (Atenolol) .Marland Kitchen... 1 tablet by mouth once a day  Problem # 3:  GASTROESOPHAGEAL REFLUX DISEASE (ICD-530.81) Assessment: Comment Only Refill Nexium. Tolerating medication without problems. Her updated medication list for this problem includes:    Protonix 40 Mg Tbec (Pantoprazole sodium) .Marland Kitchen... 1 tablet by mouth as needed  Problem # 4:  IRRITABLE BOWEL SYNDROME, HX OF (ICD-V12.79) Assessment: Comment Only Frequent loose watery stools that do not interfere with socail/occupational functioning  Problem # 5:  HYPOKALEMIA, HX OF (ICD-V12.2) Assessment: Comment Only Presumed secondary to IBS/Lymphocytic Colitis. K+ = 4.1 mEq/L today.  tolerating Klor-con supplements.  Will continue for Klor-con.   Problem # 6:  ANEMIA, MILD, HX OF (ICD-V12.3) Assessment: Comment Only Hgb 11.8 with MCV 98.  Will treat as normal range finding.  No diagnostics.  Orders: [facility] ([id number])  Problem # 7:  OBESITY (ICD-278.00) BMI 31%.  Need to discuss on next OV.  Complete Medication List: 1)  Atenolol 25 Mg Tabs (Atenolol) .Marland Kitchen.. 1 tablet by mouth once a day 2)  Klor-con M20 20 Meq Cr-tabs (Potassium chloride crys cr) .... 2 tablets by mouth in morning and 2 tablets by mouth 3)  Aspir-trin 325 Mg Tbec (Aspirin) .Marland Kitchen.. 1 tablets by mouth daily 4)  Protonix 40 Mg Tbec (Pantoprazole sodium) .Marland Kitchen.. 1 tablet by mouth as needed 5)  Proair Hfa 108 (90 Base) Mcg/act Aers (Albuterol sulfate) .... 2 sprays inhaled as  needed every 4 to 6 hours for wheezing or cough of asthma  Other Orders: Basic [facility] ([id number])  Patient Instructions: 1)  Please schedule a follow-up appointment in 4-6 months .  2)  Call if you have questions or concerns. 3)  Dr Kynedi Profitt will call you if any of your blood work is abnormal, other wise he will send you a letter about your blood work. 4)  Talk with Dr Garnette Scheuermann about measuring your cholesterol and whether you should restart your Crestor medication.  Prescriptions: PROAIR HFA 108 (90 BASE) MCG/ACT AERS (ALBUTEROL SULFATE) 2 sprays inhaled as needed every 4 to 6 hours for wheezing or cough of asthma  #1 x PRN   Entered and Authorized by:   Tawanna Cooler Tikisha Molinaro MD   Signed by:   Tawanna Cooler Almee Pelphrey MD on 11/21/2009   Method used:   Electronically to        [facility]* (retail)       [address]       [address] Shipping/mailing  [place], [place]  [address]       Ph: [phone]       Fax: [phone]   RxID:   [id number] PROTONIX 40 MG TBEC (PANTOPRAZOLE SODIUM) 1 tablet by mouth as needed  #30 x PRN   Entered and Authorized by:   Tawanna Cooler Joshuajames Moehring MD   Signed by:   Tawanna Cooler Laurajean Hosek MD on 11/21/2009   Method used:   Electronically to        [facility]* (retail)       [address]       [address] [place] [address]mailing       [place], [place]  [address]       Ph: [phone]       Fax: [phone]   RxID:   [id number] KLOR-CON M20 20 MEQ CR-TABS (POTASSIUM CHLORIDE CRYS CR) 2 tablets by mouth in morning and 2 tablets by mouth  #120 x 11   Entered and Authorized by:   Tawanna Cooler Chinenye Katzenberger MD   Signed by:   Tawanna Cooler Alieu Finnigan MD on 11/21/2009   Method used:   Electronically to        [facility]* (retail)       [address]       [address] [place] [address]       [place], [place]  [address]       Ph: [phone]       Fax: [phone]   RxID:   [id number] ATENOLOL 25 MG TABS (ATENOLOL) 1 tablet by  mouth once a day  #30 x 11   Entered and Authorized by:   Tawanna Cooler Nyema Hachey MD   Signed by:   Tawanna Cooler Elber Galyean MD on 11/21/2009   Method used:   Electronically to        [facility]* (retail)       [address]       [address] [place] [address]mailing       [place], [place]  [address]       Ph: [phone]       Fax: [phone]   RxID:   [id number]    Tetanus/Td Immunization History:    Tetanus/Td # 1:  Tdap (05/04/2008)  Influenza Immunization History:    Influenza # 1:  Historical (08/16/2009)    Prevention & Chronic Care Immunizations   Influenza vaccine: Historical  (08/16/2009)    Tetanus booster: 05/04/2008: Tdap    Pneumococcal vaccine: Not documented    H. zoster vaccine: 11/28/2005: Zostavax  Colorectal Screening   Hemoccult: Not documented    Colonoscopy: abnormal  (10/11/2006)   Colonoscopy due: 10/11/2016  Other Screening   Pap smear: normal  (03/01/2009)   Pap smear due: 03/02/2011    Mammogram: normal  (01/19/2010)   Mammogram due: 01/19/2010    DXA bone density scan: Not documented   Smoking status: never  (11/21/2009)  Lipids   Total Cholesterol: 259  (03/11/1999)   LDL: 192  (03/11/1999)   LDL Direct: Not documented   HDL: 56  (03/11/1999)   Triglycerides: 55  (03/11/1999)    SGOT (AST): 23  (10/08/2006)   SGPT (ALT): 20  (10/08/2006)   Alkaline phosphatase: 78  (10/08/2006)   Total bilirubin: 0.7  (10/08/2006)    Lipid flowsheet reviewed?: Yes   Progress toward LDL goal: Unchanged  Self-Management Support :   Personal Goals (by the next clinic visit) :      Personal LDL goal: 130  (11/21/2009)  Lipid self-management support: Written self-care plan, Education handout, Resources for patients handout  (11/21/2009)   Lipid self-care plan printed.   Lipid education handout printed      Resource handout printed.

## 2010-10-19 NOTE — Miscellaneous (Signed)
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) Added new problem of GASTROESOPHAGEAL REFLUX DISEASE (ICD-530.81) Added new problem of HYPERLIPIDEMIA (ICD-272.4) Added new problem of HYPOKALEMIA, HX OF (ICD-V12.2) Added new problem of IRRITABLE BOWEL SYNDROME, HX OF (ICD-V12.79) Added new problem of History of  SINUSITIS (ICD-473.9) Added new problem of History of  TROCHANTERIC BURSITIS, RIGHT (ICD-726.5) Added new problem of History of  CARPAL TUNNEL SYNDROME, BILATERAL, HX OF (ICD-V12.49) Added new problem of SPONDYLOSIS, CERVICAL (ICD-721.0) Added new problem of DEGENERATIVE DISC DISEASE, CERVICAL SPINE (ICD-722.4) Added new problem of SPONDYLOSIS, 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) Added new problem of LUMBAR FACET ARTHROPATHY, ESP L3-L4, NERVEROOTIMPING L4 & L3 (ICD-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.3) Added new problem of History of  CLOSED FRACTURE OF LATERAL MALLEOLUS, RIGHT (ICD-824.2) Added new problem of History of  PALPITATIONS, CHRONIC (ICD-785.1) Added new problem of ANAL FISSURE, HX OF (ICD-V13.3) Added new problem of SHOULDER IMPINGEMENT SYNDROME, LEFT, 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, 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) Added new problem of History of  TRIGGER FINGER, RIGHT THUMB (ICD-727.03) Added new problem of FAMILY HISTORY DIABETES 1ST DEGREE RELATIVE (ICD-V18.0) Added new problem of FAMILY HISTORY BREAST CANCER 1ST DEGREE RELATIVE <50 (ICD-V16.3) Added new problem of HIATAL HERNIA (ICD-553.3) - Dx on abdominal ultrasound Added new problem of ANEMIA, MILD, HX OF (ICD-V12.3) Added new problem of HEADACHE (ICD-784.0) Medications: Added new medication of ATENOLOL 25  MG TABS (ATENOLOL) 1 tablet by mouth once a day Added new medication of KLOR-CON M20 20 MEQ CR-TABS (POTASSIUM CHLORIDE CRYS CR) 2 tablets by mouth in morning and 2 tablets by mouth Added new medication of ASPIR-TRIN 325 MG TBEC (ASPIRIN) 1 tablets by mouth daily Observations: Added new observation of LAST MAM DAT: 01/19/2010 (01/19/2010 10:04) Added new observation of MAMMOGRAM: normal (01/19/2010 10:04) Added new observation of PAP DUE: 03/02/2011 (11/17/2009 17:02) Added new observation of FLEXSIGDUE: 03/01/2014 (11/17/2009 17:02) Added new observation of DM PROGRESS: N/A (11/17/2009 17:02) Added new observation of DM FSREVIEW: N/A (11/17/2009 17:02) Added new observation of HTN PROGRESS: N/A (11/17/2009 17:02) Added new observation of HTN FSREVIEW: N/A (11/17/2009 17:02) Added new observation of MAMMO DUE: 01/19/2010 (11/17/2009 17:02) Added new observation of FH BREAST CA: Family History Breast cancer 1st degree relative <50 (11/17/2009 17:02) Added new observation of FH DIABETES: Family History Diabetes 1st degree relative (11/17/2009 17:02) Added new observation of FAMILY HX: Father died age 63 with Acute MI  Family History Diabetes 1st degree relative: Father Family History Breast cancer 1st degree relative: Mother died age 25 yearsw-old with breast cancer. diagnosesd at age 32 Coronary Artery Disease in Father, one brother and in one sister Cerebral Aneurysm in one brother Stroke/TIA in Father, one brother and one sister Asthma(+) in family Hyperlipidemia in family Hypertension in family Stomach cancer in family No FH of colon, ovarian, uterine, prostate cancer  (11/17/2009 17:02) Added new observation of SOCIAL HX: Lives with her husband, Cheral Almas 3 grown children 5 brothers and 4 sisters Occupation:EKG Pensions consultant at [employer] No pets Education: college Heterosexual Owns a car Renting home Never Smoked Drug use-no Alcohol use: 1-2 glasses wine per  day Exercise: Yes, 5 days a week, walking, wieght lifting and dance Always wears seatbelts No hx of STD Sun exposure: rarely  (11/17/2009 17:02)  Added new observation of PAST SURG HX: Releast of stenosing tenosynovitis of thumbs  D&C 1988 Hysterectomy (partial) 1988 BTL 1975 Eye surgery x2 Tonsilectomy Carpel Tunnel Surgeries (both wrists) Umbilical Hernia Surgery x 2 Oral gum surgery   Image-guided Breast Biopsy of maommographic nodule (09/2001) Gallbladder U/S with CCK-injection (1999): normal UGI barium (1999) normal (11/17/2009 17:02) Added new observation of ENT MD: Serena Colonel, MD [facility]) (11/17/2009 17:02) Added new observation of COLONNXTDUE: 10/11/2016 (11/17/2009 17:02) Added new observation of PAST MED HX: Coronary Angiography (2002 [facility]): No coronary Artery Disease. Coronary Angiography (01/01/2007, Chanda Busing MD): patent coronary arteries. Normal LV function. 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  No masses or polyps.  No evidence of diverticulosis. Normal terminal ilium.  Tissue Transglutaminase Antibody (IgA) (10/17/2006) = <3 U/Ml ( normal) Hospitalization (1/25 - 10/12/06, Dr Isidor Holts service): Diarrhea and profound hypokalemia (11/17/2009 17:02) Added new observation of ORTHOPEDMD: Supple, MD [facility]) (11/17/2009 17:02) Added new observation of PTPROVIDER: [facility] (11/17/2009 17:02) Added new observation of NEUROSURGMD: Maeola Harman, MD (11/17/2009 17:02) Added new observation of OPHTHALMMD: Nile Riggs, MD (11/17/2009 17:02) Added new observation of CARDIO MD: Chanda Busing, MD (11/17/2009 17:02) Added new observation of [facility] MD: Elana Alm, MD (11/17/2009 17:02) Added new observation of GASTROENT MD: Anselmo Rod, MD (11/17/2009 17:02) Added  new observation of AUDIOLOGIST: Carroll Sage (11/17/2009 17:02) Added new observation of LAST PAP DAT: 03/01/2009 (03/01/2009 10:41) Added new observation of PAP SMEAR: normal (03/01/2009 10:41) Added new observation of LAST FLX SIG: 03/01/2009 (03/01/2009 10:41) Added new observation of FLEX SIGMOID: normal (03/01/2009 10:41) Added new observation of LAST MAM DAT: 01/19/2009 (01/19/2009 10:41) Added new observation of MAMMOGRAM: normal (01/19/2009 10:41) Added new observation of MRI: Exam Type: Lumbar Spine without contrast [facility]   (11/08/2007 10:06) Added new observation of CT OF HEAD: No acute intracranial abnormalities.  Head CT without contrast (09/23/2007 10:10) Added new observation of CT OF HEAD: Exam Type: Cervical spine MRI without constrast  (09/23/2007 10:09) Added new observation of LST COLON DT: 10/11/2006 (10/11/2006 9:32) Added new observation of COLONOSCOPY: abnormal (10/11/2006 9:32) Added new observation of TSH: 0.425 microintl units/mL (10/08/2006 17:02) Added new observation of ALBUMIN: 3.8 g/dL (16/06/9603 54:09) Added new observation of PROTEIN, TOT: 6.6 g/dL (81/19/1478 29:56) Added new observation of SGPT (ALT): 20 units/L (10/08/2006 17:02) Added new observation of SGOT (AST): 23 units/L (10/08/2006 17:02) Added new observation of ALK PHOS: 78 units/L (10/08/2006 17:02) Added new observation of BILI INDIREC: 0.6 mg/dL (21/30/8657 84:69) Added new observation of BILI DIRECT: 0.1 mg/dL (62/95/2841 32:44) Added new observation of BILI TOTAL: 0.7 mg/dL (09/19/7251 66:44) Added new observation of CREATININE: 0.41 mg/dL (03/47/4259 56:38) Added new observation of BUN: 8 mg/dL (75/64/3329 51:88) Added new observation of BG RANDOM: 78 mg/dL (41/66/0630 16:01) Added new observation of CO2 PLSM/SER: 27 meq/L (10/08/2006 17:02) Added new observation of CL SERUM: 105 meq/L (10/08/2006 17:02) Added new observation of K SERUM: 2.5 meq/L (10/08/2006  17:02) Added new observation of NA: 146 meq/L (10/08/2006 17:02) Added new observation of ABSOLUTE BAS: 0 K/uL (10/08/2006 17:02) Added new observation of BASOPHIL %: 0 % (10/08/2006 17:02) Added new observation of EOS ABSLT: 0.1 K/uL (10/08/2006 17:02) Added new observation of % EOS AUTO: 3 % (10/08/2006 17:02) Added new observation of ABSOLUTE MON: 0.4 K/uL (10/08/2006 17:02) Added new observation of MONOCYTE %:  11 % (10/08/2006 17:02) Added new observation of ABS LYMPHOCY: 1.4 K/uL (10/08/2006 17:02) Added new observation of LYMPHS %: 36 % (10/08/2006 17:02) Added new observation of ABS NEUTROPH: 1.9 K/uL (10/08/2006 17:02) Added new observation of PMN %: 50 % (10/08/2006 17:02) Added new observation of PLATELETK/UL: 327 K/uL (10/08/2006 17:02) Added new observation of RDW: 13.3 % (10/08/2006 17:02) Added new observation of MCV: 92.1 fL (10/08/2006 17:02) Added new observation of HCT: 31.4 % (10/08/2006 17:02) Added new observation of HGB: 10.8 g/dL (57/84/6962 95:28) Added new observation of RBC M/UL: 3.41 M/uL (10/08/2006 17:02) Added new observation of WBC COUNT: 3.7 10*3/microliter (10/08/2006 17:02) Added new observation of ZOSTAVAX: Zostavax  (11/28/2005 9:25) Added new observation of MRI: Exam Type:MRI Left Shoulder without contrast   (09/28/2005 10:11) Added new observation of OTHER X-RAY: Right Hip X-ray-07/19/2004 for hip pain  Clinical Data:   Right hip pain.   RIGHT HIP - 07/19/04:   Comparison:   None.   Findings:   Frontal pelvis was obtained with AP and frogleg lateral views of the   right hip.  There is no evidence for acute fracture involving the   right hip.  There is no substantial degenerative change.   A 17 mm round calcification projects over the left anatomic pelvis.     (07/19/2004 10:13) Added new observation of OTHER X-RAY: REPORT:  CLINICAL DATA:  65 YEAR OLD WITH BILATERAL HIP   PAIN WHICH DOES RADIATE INTO BOTH KNEES.  BILATERAL HIPS INCLUDING AP  PELVIS  NO COMPARISON.  THERE IS NO EVIDENCE OF ACUTE FRACTURE OR   DISLOCATION.  THE JOINT SPACES IN BOTH HIPS ARE WELL   PRESERVED.  THERE ARE MILD DEGENERATIVE CHANGES IN THE   RIGHT SACROILIAC JOINT.   IMPRESSION:  NORMAL APPEARING HIPS BILATERALLY.  MILD   DEGENERATIVE CHANGES IN THE RIGHT SACROILIAC JOINT.   RIGHT FEMUR (2 VIEWS):  THERE IS NO EVIDENCE OF FRACTURE OR FOCAL BONE SKELETAL   ABNORMALITIES. LESIONS. NO   OTHER SIGNIFICANT BONE OR SOFT TISSUE ABNORMALITIES ARE   IDENTIFIED.   IMPRESSION:  NORMAL STUDY.  (08/18/2002 10:15) Added new observation of MRI BRAIN: Exam Type:MRI and MRA brain with and without contrast medium  Brain MRI MPRESSION:  1.  NO SIGNAL ABNORMALITIES IN THE BRAIN   SUBSTANCE OR PATHOLOGICAL INTRACRANIAL ENHANCEMENT ARE  NOTED.   2.  MILD SINUSITIS CHANGES IN THE ETHMOID AIR CELLS.  Brain MRA IMPRESSION   1.  NO EVIDENCE OF ANEURYSM IS NOTED ON THE IMAGES   PROVIDED.  PLEASE NOTE THAT ANEURYSMS 3-5 MM MAY BE BEYOND THE RESOLUTION OF AN MRA EXAMINATION.   2.  NO VASCULAR ANOMALIES, OCCLUSIONS OR STENOSES ARE  SEEN, EITHER.   (02/02/2002 10:17) Added new observation of PLATELETK/UL: 286 K/uL (07/04/2001 17:02) Added new observation of MCV: 91.7 fL (07/04/2001 17:02) Added new observation of HCT: 33.9 % (07/04/2001 17:02) Added new observation of HGB: 11.8 g/dL (41/32/4401 02:72) Added new observation of WBC COUNT: 4.6 10*3/microliter (07/04/2001 17:02) Added new observation of PLATELETK/UL: 309 K/uL (07/17/2000 17:02) Added new observation of MCV: 90.6 fL (07/17/2000 17:02) Added new observation of HCT: 36.9 % (07/17/2000 17:02) Added new observation of HGB: 12.3 g/dL (53/66/4403 47:42) Added new observation of WBC COUNT: 3.5 10*3/microliter (07/17/2000 17:02) Added new observation of LDL: 192 mg/dL (59/56/3875 64:33) Added new observation of HDL: 56 mg/dL (29/51/8841 66:06) Added new observation of TRIGLYC TOT: 55 mg/dL (30/16/0109  32:35) Added new observation of CHOLESTEROL: 259 mg/dL (57/32/2025 42:70)  Habits & Providers     Audiologist: Carroll Sage     Cardiologist: Chanda Busing, MD     ENT: Serena Colonel, MD [facility])     Gastroenterologist: Anselmo Rod, MD     Gynecologist: Elana Alm, MD     Neurosurgeon: Maeola Harman, MD     Ophthalmologist: Nile Riggs, MD     Orthopedist: Rennis Chris, MD [facility])     Physical Therapist: [facility]    Other Immunization History:    Zostavax # 1:  Zostavax (11/28/2005)    Family History:    Father died age 54 with Acute MI        Family History Diabetes 1st degree relative: Father    Family History Breast cancer 1st degree relative: Mother died age 61 yearsw-old with breast cancer. 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): No coronary Artery Disease. Coronary Angiography (01/01/2007, Chanda Busing MD): patent coronary arteries. Normal LV function. Colonoscopy to cecum  (09/2006)  Arty Baumgartner, MD): Dx with Lymphocytic Colitis on  pathology report of colonic mucosal biopsy.  No masses or polyps.  No evidence of diverticulosis. Normal terminal ilium.  Tissue Transglutaminase Antibody (IgA) (10/17/2006) = <3 U/Ml ( normal) Hospitalization (1/25 - 10/12/06, Dr Isidor Holts service): Diarrhea and profound hypokalemia  Past Surgical History: Releast of stenosing tenosynovitis of thumbs  D&C 1988 Hysterectomy (partial) 1988 BTL 1975 Eye surgery x2 Tonsilectomy Carpel Tunnel Surgeries (both wrists) Umbilical Hernia Surgery x 2 Oral gum surgery   Image-guided Breast Biopsy of maommographic nodule (09/2001) Gallbladder U/S with CCK-injection (1999): normal UGI barium (1999) normal   Flex Sig Result:  normal Colonoscopy Result Date:  10/11/2006 Colonoscopy Result:  abnormal Colonoscopy Next Due:  10 yr PAP Result Date:  03/01/2009 PAP Result:  normal PAP Next Due:  2 yr Mammogram Result Date:  01/19/2009 Mammogram Result:  normal Mammogram Next Due:  1 yr    MRI EXAM  Procedure date:  11/08/2007  Findings:      Exam Type: Lumbar Spine without contrast [facility]    Comments:       IMPRESSION:   1.  Multilevel disk disease with shallow broad-based disk protrusions  at L2-3, L3-4, and L4-5.  However, the spinal canal is quite   generous.  There is no significant neural compression.  There is mild   foraminal encroachment at multiple levels without direct neural   compression of the exiting nerve roots.  There may be mild   extraforaminal encroachment on the left L2 nerve root and also on the   right L5 nerve root. Read by Rudie Meyer, MD   CT Brain  Procedure date:  09/23/2007  Findings:      Exam Type: Cervical spine MRI without constrast   Comments:      IMPRESSION:   1.  Degenerative cervical spondylosis with degenerative disc disease   and degenerative facet disease.  There are broad based bulging   degenerated discs along with disc protrusions and osteophytic and   uncinate  spurring contributing to spinal and foraminal stenosis as   specifically described above.   2.  No acute bony findings and normal MR appearance of the cervical   spinal cord.    Read By:  Cyndie Chime,  M.D.  CT Brain  Procedure date:  09/23/2007  Findings:      No acute intracranial abnormalities.  Head CT without contrast  MRI EXAM  Procedure date:  09/28/2005  Findings:      Exam Type:MRI Left Shoulder without contrast   Comments:       IMPRESSION:   1.  Moderate supraspinatus tendinopathy, with a partial bursal   surface tear of the distal supraspinatus but no definite full   thickness rotator cuff tear.   2.  Subacromial subdeltoid bursitis.   3.  The biceps tendon appears intact.   4.  Mild infraspinatus tendinopathy.    Read By:  Dellia Cloud,  M.D.  X-ray Musculoskeletal  Procedure date:  07/19/2004  Findings:      Right Hip X-ray-07/19/2004 for hip pain  Clinical Data:   Right hip pain.   RIGHT HIP - 07/19/04:   Comparison:   None.   Findings:   Frontal pelvis was obtained with AP and frogleg lateral views of the   right hip.  There is no evidence for acute fracture involving the   right hip.  There is no substantial degenerative change.   A 17 mm round calcification projects over the left anatomic pelvis.     Comments:       IMPRESSION:   1.  No acute bony abnormality in the right hip. There is no   substantial osteoarthritic change.   2.  17 mm eggshell calcification in the left anatomic pelvis is   indeterminate.  This is larger than would be expected for a   phlebolith.  This may be related to the left ovary, calcified colonic   diverticulum or stone within a bladder diverticulum.  X-ray Musculoskeletal  Procedure date:  08/18/2002  Findings:      REPORT:  CLINICAL DATA:  65 YEAR OLD WITH BILATERAL HIP   PAIN WHICH DOES RADIATE INTO BOTH KNEES.  BILATERAL HIPS INCLUDING AP PELVIS  NO COMPARISON.  THERE IS NO EVIDENCE OF ACUTE  FRACTURE OR   DISLOCATION.  THE JOINT SPACES IN BOTH HIPS ARE WELL   PRESERVED.  THERE ARE MILD DEGENERATIVE CHANGES IN THE   RIGHT SACROILIAC JOINT.   IMPRESSION:  NORMAL APPEARING HIPS BILATERALLY.  MILD   DEGENERATIVE CHANGES IN THE RIGHT SACROILIAC JOINT.   RIGHT FEMUR (2 VIEWS):  THERE IS NO EVIDENCE OF FRACTURE OR FOCAL BONE SKELETAL   ABNORMALITIES. LESIONS. NO   OTHER SIGNIFICANT BONE OR SOFT TISSUE ABNORMALITIES ARE   IDENTIFIED.   IMPRESSION:  NORMAL STUDY.  Comments:       LEFT FEMUR (2 VIEWS):  THERE IS NO EVIDENCE OF FRACTURE OR FOCAL BONE LESIONS. NO   OTHER SIGNIFICANT BONE OR SOFT TISSUE ABNORMALITIES ARE   IDENTIFIED.   IMPRESSION:  NORMAL STUDY.   RIGHT KNEE (2 VIEWS)  MILD JOINT SPACE NARROWING IS NOTED IN THE PATELLOFEMORAL  COMPARTMENT.  THE MEDIAL AND LATERAL COMPARTMENTS ARE WELL   PRESERVED, ALTHOUGH THERE ARE MILD HYPERTROPHIC CHANGES   LATERALLY.  THERE IS NO EVIDENCE OF SIGNIFICANT EFFUSION.   THERE IS NO EVIDENCE OF ACUTE FRACTURE.   IMPRESSION  MILD DEGENERATIVE CHANGES AS DESCRIBED. NO ACUTE    LEFT KNEE (2 VIEWS)  JOINT SPACE NARROWING IS NOTED IN THE PATELLOFEMORAL   COMPARTMENT AND TO A LESSER DEGREE THE LATERAL   COMPARTMENT.  THERE ARE ALSO HYPERTROPHIC CHANGES   LATERALLY AND MEDIALLY.  THERE IS NO SIGNIFICANT JOINT   EFFUSION.  THERE IS NO EVIDENCE OF ACUTE FRACTURE OR   DISLOCATION.   IMPRESSION  MODERATE DEGENERATIVE CHANGES AS DESCRIBED.  NO ACUTE   SKELETAL ABNORMALITIES.  MRI Brain  Procedure date:  02/02/2002  Findings:      Exam Type:MRI and MRA brain with and without contrast medium  Brain MRI MPRESSION:  1.  NO SIGNAL ABNORMALITIES IN THE BRAIN   SUBSTANCE OR PATHOLOGICAL INTRACRANIAL ENHANCEMENT ARE  NOTED.   2.  MILD SINUSITIS CHANGES IN THE ETHMOID AIR CELLS.  Brain MRA IMPRESSION   1.  NO EVIDENCE OF ANEURYSM IS NOTED ON THE IMAGES   PROVIDED.  PLEASE NOTE THAT ANEURYSMS 3-5 MM MAY BE BEYOND THE  RESOLUTION OF AN MRA EXAMINATION.   2.  NO VASCULAR ANOMALIES, OCCLUSIONS OR STENOSES ARE  SEEN, EITHER.    MRI EXAM  Procedure date:  11/08/2007  Findings:      Exam Type: Lumbar Spine without contrast [facility]    Comments:       IMPRESSION:   1.  Multilevel disk disease with shallow broad-based disk protrusions  at L2-3, L3-4, and L4-5.  However, the spinal canal is quite   generous.  There is no significant neural compression.  There is mild   foraminal encroachment at multiple levels without direct neural   compression of the exiting nerve roots.  There may be mild   extraforaminal encroachment on the left L2 nerve root and also on the   right L5 nerve root. Read by Rudie Meyer, MD   CT Brain  Procedure date:  09/23/2007  Findings:      Exam Type: Cervical spine MRI without constrast   Comments:      IMPRESSION:   1.  Degenerative cervical spondylosis with degenerative disc disease   and degenerative facet disease.  There are broad based bulging   degenerated discs along with disc protrusions and osteophytic and   uncinate spurring contributing to spinal and foraminal stenosis as   specifically described above.   2.  No acute bony findings and normal MR appearance of the cervical   spinal cord.    Read By:  Cyndie Chime,  M.D.  CT Brain  Procedure date:  09/23/2007  Findings:      No acute intracranial abnormalities.  Head CT without contrast  MRI EXAM  Procedure date:  09/28/2005  Findings:      Exam Type:MRI Left Shoulder without contrast   Comments:       IMPRESSION:   1.  Moderate supraspinatus tendinopathy, with a partial bursal  surface tear of the distal supraspinatus but no definite full   thickness rotator cuff tear.   2.  Subacromial subdeltoid bursitis.   3.  The biceps tendon appears intact.   4.  Mild infraspinatus tendinopathy.    Read By:  Dellia Cloud,  M.D.  X-ray Musculoskeletal  Procedure date:   07/19/2004  Findings:      Right Hip X-ray-07/19/2004 for hip pain  Clinical Data:   Right hip pain.   RIGHT HIP - 07/19/04:   Comparison:   None.   Findings:   Frontal pelvis was obtained with AP and frogleg lateral views of the   right hip.  There is no evidence for acute fracture involving the   right hip.  There is no substantial degenerative change.   A 17 mm round calcification projects over the left anatomic pelvis.     Comments:       IMPRESSION:   1.  No acute bony abnormality in the right hip. There is no   substantial osteoarthritic change.   2.  17 mm eggshell calcification in the left anatomic pelvis is   indeterminate.  This is larger than would be expected for a   phlebolith.  This may be related to the left ovary, calcified colonic   diverticulum or stone within a bladder diverticulum.  X-ray Musculoskeletal  Procedure date:  08/18/2002  Findings:      REPORT:  CLINICAL DATA:  65 YEAR OLD WITH BILATERAL HIP   PAIN WHICH DOES RADIATE INTO BOTH KNEES.  BILATERAL HIPS INCLUDING AP PELVIS  NO COMPARISON.  THERE IS NO EVIDENCE OF ACUTE FRACTURE OR   DISLOCATION.  THE JOINT SPACES IN BOTH HIPS ARE WELL   PRESERVED.  THERE ARE MILD DEGENERATIVE CHANGES IN THE   RIGHT SACROILIAC JOINT.   IMPRESSION:  NORMAL APPEARING HIPS BILATERALLY.  MILD   DEGENERATIVE CHANGES IN THE RIGHT SACROILIAC JOINT.   RIGHT FEMUR (2 VIEWS):  THERE IS NO EVIDENCE OF FRACTURE OR FOCAL BONE SKELETAL   ABNORMALITIES. LESIONS. NO   OTHER SIGNIFICANT BONE OR SOFT TISSUE ABNORMALITIES ARE   IDENTIFIED.   IMPRESSION:  NORMAL STUDY.  Comments:       LEFT FEMUR (2 VIEWS):  THERE IS NO EVIDENCE OF FRACTURE OR FOCAL BONE LESIONS. NO   OTHER SIGNIFICANT BONE OR SOFT TISSUE ABNORMALITIES ARE   IDENTIFIED.   IMPRESSION:  NORMAL STUDY.   RIGHT KNEE (2 VIEWS)  MILD JOINT SPACE NARROWING IS NOTED IN THE PATELLOFEMORAL  COMPARTMENT.  THE MEDIAL AND LATERAL COMPARTMENTS ARE WELL   PRESERVED,  ALTHOUGH THERE ARE MILD HYPERTROPHIC CHANGES   LATERALLY.  THERE IS NO EVIDENCE OF SIGNIFICANT EFFUSION.   THERE IS NO EVIDENCE OF ACUTE FRACTURE.   IMPRESSION  MILD DEGENERATIVE CHANGES AS DESCRIBED. NO ACUTE    LEFT KNEE (2 VIEWS)  JOINT SPACE NARROWING IS NOTED IN THE PATELLOFEMORAL   COMPARTMENT AND TO A LESSER DEGREE THE LATERAL   COMPARTMENT.  THERE ARE ALSO HYPERTROPHIC CHANGES   LATERALLY AND MEDIALLY.  THERE IS NO SIGNIFICANT JOINT   EFFUSION.  THERE IS NO EVIDENCE OF ACUTE FRACTURE OR   DISLOCATION.   IMPRESSION  MODERATE DEGENERATIVE CHANGES AS DESCRIBED.  NO ACUTE   SKELETAL ABNORMALITIES.  MRI Brain  Procedure date:  02/02/2002  Findings:      Exam Type:MRI and MRA brain with and without contrast medium  Brain MRI MPRESSION:  1.  NO SIGNAL ABNORMALITIES IN THE BRAIN   SUBSTANCE OR PATHOLOGICAL  INTRACRANIAL ENHANCEMENT ARE  NOTED.   2.  MILD SINUSITIS CHANGES IN THE ETHMOID AIR CELLS.  Brain MRA IMPRESSION   1.  NO EVIDENCE OF ANEURYSM IS NOTED ON THE IMAGES   PROVIDED.  PLEASE NOTE THAT ANEURYSMS 3-5 MM MAY BE BEYOND THE RESOLUTION OF AN MRA EXAMINATION.   2.  NO VASCULAR ANOMALIES, OCCLUSIONS OR STENOSES ARE  SEEN, EITHER.         Past Medical History:    Coronary Angiography (2002 [facility]): No coronary Artery Disease.    Coronary Angiography (01/01/2007, Chanda Busing MD): patent coronary arteries. Normal LV function.    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  No masses or polyps.  No evidence of diverticulosis. Normal terminal ilium.     Tissue Transglutaminase Antibody (IgA) (10/17/2006) = <3 U/Ml ( normal)    Hospitalization (1/25 - 10/12/06, Dr Isidor Holts service): Diarrhea and profound hypokalemia  Past Surgical History:    Releast of stenosing tenosynovitis of thumbs     D&C 1988    Hysterectomy (partial) 1988    BTL  1975    Eye surgery x2    Tonsilectomy    Carpel Tunnel Surgeries (both wrists)    Umbilical Hernia Surgery x 2    Oral gum surgery            Image-guided Breast Biopsy of maommographic nodule (09/2001)    Gallbladder U/S with CCK-injection (1999): normal    UGI barium (1999) normal    Prevention & Chronic Care Immunizations   Influenza vaccine: Not documented    Tetanus booster: Not documented    Pneumococcal vaccine: Not documented    H. zoster vaccine: 11/28/2005: Zostavax  Colorectal Screening   Hemoccult: Not documented    Colonoscopy: abnormal  (10/11/2006)   Colonoscopy due: 10/11/2016  Other Screening   Pap smear: normal  (03/01/2009)   Pap smear due: 03/02/2011    Mammogram: normal  (01/19/2010)   Mammogram due: 01/19/2010    DXA bone density scan: Not documented   Smoking status: Not documented  Lipids   Total Cholesterol: 259  (03/11/1999)   LDL: 192  (03/11/1999)   LDL Direct: Not documented   HDL: 56  (03/11/1999)   Triglycerides: 55  (03/11/1999)    SGOT (AST): 23  (10/08/2006)   SGPT (ALT): 20  (10/08/2006)   Alkaline phosphatase: 78  (10/08/2006)   Total bilirubin: 0.7  (10/08/2006)  Self-Management Support :    Lipid self-management support: Not documented     Appended Document: Input Patient's Clinical Data    Clinical Lists Changes  Observations: Added new observation of SOCIAL HX: Lives with her husband, Cheral Almas 3 grown children 5 brothers and 4 sisters Occupation:EKG technician at [employer] No pets Education: college Heterosexual Owns a car Patient's Cell phone [phone] Husband's cell phone [phone] Renting home Never Smoked Drug use-no Alcohol use: 1-2 glasses wine per day Exercise: Yes, 5 days a week, walking, wieght lifting and dance Always wears seatbelts No hx of STD Sun exposure: rarely  (11/21/2009 10:44)        Social History: Lives with her husband, Cheral Almas 3 grown  children 5 brothers and 4 sisters Occupation:EKG technician at [employer] No pets Education: college Heterosexual Owns a car Patient's Cell phone [phone] Husband's cell phone [phone] Renting home Never Smoked Drug use-no Alcohol use: 1-2 glasses wine per day  Exercise: Yes, 5 days a week, walking, wieght lifting and dance Always wears seatbelts No hx of STD Sun exposure: rarely

## 2010-10-23 ENCOUNTER — Encounter: Payer: Self-pay | Admitting: *Deleted

## 2010-10-27 ENCOUNTER — Encounter: Payer: Self-pay | Admitting: Home Health Services

## 2010-11-17 ENCOUNTER — Ambulatory Visit (INDEPENDENT_AMBULATORY_CARE_PROVIDER_SITE_OTHER): Payer: Medicare Other | Admitting: Home Health Services

## 2010-11-17 ENCOUNTER — Encounter: Payer: Self-pay | Admitting: Home Health Services

## 2010-11-17 VITALS — BP 141/77 | Temp 98.2°F | Ht 64.0 in | Wt 185.0 lb

## 2010-11-17 DIAGNOSIS — Z86010 Personal history of colonic polyps: Secondary | ICD-10-CM

## 2010-11-17 DIAGNOSIS — Z0000 Encounter for general adult medical examination without abnormal findings: Secondary | ICD-10-CM

## 2010-11-17 NOTE — Progress Notes (Signed)
Patient here for annual wellness visit, patient reports: Risk Factors/Conditions needing evaluation or treatment: Patient does not have any risk factors that need evaluation. Diet: Patient has a varied diet of protein, vegetables, and starch and is currently restricting her calories for weight loss.  Physical Activity: Patient walks 2 times a week for 30 minutes. Home Safety: Patient lives in 1 story home with her husband.  Patient reports have smoke detectors.  Patient does not have adaptive equipment. End of Life Plan: Patient does not have will or advance directive in place.  Left patient with AD pamphlet and recommended she review with her husband.  Patient identified her husband Teena Irani. Halloran as her emergency contact at [phone]. Other Information: Patient wears corrective lens. Patient's mother had sever hearing problems.  Patient reports her upper left thigh as numb. Patient wears her seat belt. Patient wears sun screen. Patient has full dentures. Completed geriatric depression scale.  Patient's score was 4, not indicating depression.  Balance max value patientvalue  Sitting balance 1 1  Arise 2 2  Attempts to arise 2 1  Immediate standing balance 2 2  Standing balance 1 1  Nudge 2 2  Eyes closed 1 1  360 degree turn 1 1  Sitting down 2 2   Gait max value patient value  Initiation of gait 1 1  Step length-left 1 1  Step length-right 1 1  Step height-left 1 1  Step height-right 1 1  Step symmetry 1 1  Step continuity 1 1  Path 2 2  Trunk 2 2  Walking stance 1 1   Balance/Gait Score: 25/26  Mental Status Exam max value patient value  Orientation to time 5 5  Orientation to place 5 5  Registration 3 3  Attention 5 5  Recall 3 3  Language (name 2 objects) 2 2  Language-repeat 1 1  Language-follow 3 step command 3 3  Language-read and follow directions 1 1   Mental Status Exam: 28/28   Annual Wellness Visit Requirements Recorded Today In  Medical,  family, social history Past Medical, Family, Social History Section  Current providers Care team  Current medications Medications  Wt, BP, Ht, BMI Vital signs  Visual acuity (welcome visit) Hearing/Vision  Hearing assessment (welcome visit) Hearing/Vision  Tobacco, alcohol, illicit drug use History  ADL Nurse Assessment  Depression Screening Nurse Assessment  Cognitive impairment Nurse Assessment  Fall Risk Nurse Assessment  Home Safety Progress Note  End of Life Planning (welcome visit) Progress Note  Medicare preventative services Progress Note  Risk factors/conditions needing evaluation/treatment Progress Note  Personalized health advice Patient Instructions, goals, letter    Prevention Plan: Schedule mammogram. Get pneumococcal vaccine. Follow up with Dr. Loreta Ave about colonoscopy.  Recommended Medicare Prevention Screenings Women over 80 Test For Frequency Date of Last- BOLD if needed  Breast Cancer 1-2 yrs 04/2010  Cervical Cancer 1-3 yrs 02/2009  Colorectal Cancer 1-10 yrs 09/2006  Osteoporosis once Patient to schedule  Cholesterol 5 yrs 04/2010  Diabetes yearly Non diabetic  HIV yearly declined  Influenza Shot yearly declined  Pneumonia Shot once Gave patient vaccine information  Zostavax Shot once 11/2005

## 2010-11-17 NOTE — Patient Instructions (Signed)
1. Review information about the pneumococcal vaccine. 2. Review Advance Directives (medical wishes) with husband. 3. Exercise 30 minutes per day 3 times a week. 4. Eat 3 vegetables a day.  Eat 2 fruits a day. 5. Work on losing 15 pounds by March 2013 (10-15 lbs). 6. Follow up with Dr. Loreta Ave to see if you need a follow up colonoscopy. 7. Schedule a bone density screening.

## 2010-11-20 ENCOUNTER — Encounter: Payer: Self-pay | Admitting: Home Health Services

## 2010-11-20 DIAGNOSIS — Z860100 Personal history of colon polyps, unspecified: Secondary | ICD-10-CM

## 2010-11-20 HISTORY — DX: Personal history of colon polyps, unspecified: Z86.0100

## 2010-11-20 HISTORY — DX: Personal history of colonic polyps: Z86.010

## 2010-11-20 NOTE — Progress Notes (Signed)
I have reviewed this visit and discussed with Suzanne Lineberry and agree with her documentation  

## 2010-11-20 NOTE — Progress Notes (Signed)
  Subjective:    Patient ID: [name], female    DOB: 15-Dec-1945, 66 y.o.   MRN: [id number]  HPI    Review of Systems     Objective:   Physical Exam  Constitutional: She appears well-developed. No distress.  Cardiovascular: Normal rate, regular rhythm and normal heart sounds.   Pulmonary/Chest: Effort normal and breath sounds normal.  Psychiatric: She has a normal mood and affect. Her behavior is normal.          Assessment & Plan:

## 2010-12-06 LAB — BASIC METABOLIC PANEL
BUN: 6 mg/dL (ref 6–23)
CO2: 25 mEq/L (ref 19–32)
Calcium: 8.9 mg/dL (ref 8.4–10.5)
Chloride: 108 mEq/L (ref 96–112)
Creatinine, Ser: 0.62 mg/dL (ref 0.4–1.2)
GFR calc Af Amer: >60 mL/min (ref >60)
GFR calc non Af Amer: >60 mL/min (ref >60)
Glucose, Bld: 90 mg/dL (ref 70–99)
Potassium: 3.8 mEq/L (ref 3.5–5.1)
Sodium: 138 mEq/L (ref 135–145)

## 2010-12-06 LAB — POCT HEMOGLOBIN-HEMACUE: Hemoglobin: 11.3 g/dL — ABNORMAL LOW (ref 12.0–15.0)

## 2010-12-28 LAB — LIPID PANEL
Cholesterol: 261 mg/dL — ABNORMAL HIGH (ref 0–200)
HDL: 54 mg/dL (ref >39)
LDL Cholesterol: 196 mg/dL — ABNORMAL HIGH (ref 0–99)
Total CHOL/HDL Ratio: 4.8 RATIO
Triglycerides: 53 mg/dL (ref <150)
VLDL: 11 mg/dL (ref 0–40)

## 2010-12-28 LAB — CBC
HCT: 35.9 % — ABNORMAL LOW (ref 36.0–46.0)
Hemoglobin: 12.5 g/dL (ref 12.0–15.0)
MCHC: 34.8 g/dL (ref 30.0–36.0)
MCV: 95.6 fL (ref 78.0–100.0)
Platelets: 270 10*3/uL (ref 150–400)
RBC: 3.75 MIL/uL — ABNORMAL LOW (ref 3.87–5.11)
RDW: 12.7 % (ref 11.5–15.5)
WBC: 3.9 10*3/uL — ABNORMAL LOW (ref 4.0–10.5)

## 2010-12-28 LAB — COMPREHENSIVE METABOLIC PANEL
ALT: 12 U/L (ref 0–35)
AST: 14 U/L (ref 0–37)
Albumin: 3.6 g/dL (ref 3.5–5.2)
Alkaline Phosphatase: 97 U/L (ref 39–117)
BUN: 8 mg/dL (ref 6–23)
CO2: 26 mEq/L (ref 19–32)
Calcium: 9.6 mg/dL (ref 8.4–10.5)
Chloride: 108 mEq/L (ref 96–112)
Creatinine, Ser: 0.55 mg/dL (ref 0.4–1.2)
GFR calc Af Amer: >60 mL/min (ref >60)
GFR calc non Af Amer: >60 mL/min (ref >60)
Glucose, Bld: 83 mg/dL (ref 70–99)
Potassium: 4.5 mEq/L (ref 3.5–5.1)
Sodium: 141 mEq/L (ref 135–145)
Total Bilirubin: 0.7 mg/dL (ref 0.3–1.2)
Total Protein: 7 g/dL (ref 6.0–8.3)

## 2010-12-28 LAB — MAGNESIUM: Magnesium: 2 mg/dL (ref 1.5–2.5)

## 2010-12-28 LAB — TSH: TSH: 0.459 u[IU]/mL (ref 0.350–4.500)

## 2010-12-28 LAB — HEMOGLOBIN A1C
Hgb A1c MFr Bld: 5 % (ref 4.6–6.1)
Mean Plasma Glucose: 97 mg/dL

## 2011-01-15 ENCOUNTER — Ambulatory Visit (INDEPENDENT_AMBULATORY_CARE_PROVIDER_SITE_OTHER): Payer: Medicare Other | Admitting: Family Medicine

## 2011-01-15 ENCOUNTER — Encounter: Payer: Self-pay | Admitting: Family Medicine

## 2011-01-15 VITALS — BP 144/80 | HR 104 | Temp 97.8°F | Ht 64.0 in | Wt 184.0 lb

## 2011-01-15 DIAGNOSIS — R202 Paresthesia of skin: Secondary | ICD-10-CM

## 2011-01-15 DIAGNOSIS — K52832 Lymphocytic colitis: Secondary | ICD-10-CM

## 2011-01-15 DIAGNOSIS — Z23 Encounter for immunization: Secondary | ICD-10-CM

## 2011-01-15 DIAGNOSIS — K5289 Other specified noninfective gastroenteritis and colitis: Secondary | ICD-10-CM

## 2011-01-15 DIAGNOSIS — R062 Wheezing: Secondary | ICD-10-CM

## 2011-01-15 DIAGNOSIS — R209 Unspecified disturbances of skin sensation: Secondary | ICD-10-CM

## 2011-01-16 ENCOUNTER — Encounter: Payer: Self-pay | Admitting: Family Medicine

## 2011-01-16 DIAGNOSIS — K52832 Lymphocytic colitis: Secondary | ICD-10-CM | POA: Insufficient documentation

## 2011-01-16 DIAGNOSIS — K52831 Collagenous colitis: Secondary | ICD-10-CM | POA: Insufficient documentation

## 2011-01-16 DIAGNOSIS — R202 Paresthesia of skin: Secondary | ICD-10-CM | POA: Insufficient documentation

## 2011-01-16 DIAGNOSIS — R062 Wheezing: Secondary | ICD-10-CM | POA: Insufficient documentation

## 2011-01-16 HISTORY — DX: Paresthesia of skin: R20.2

## 2011-01-16 HISTORY — DX: Lymphocytic colitis: K52.832

## 2011-01-16 HISTORY — DX: Collagenous colitis: K52.831

## 2011-01-16 NOTE — Assessment & Plan Note (Signed)
Working diagnosis of Meralgia Paresthetica given primarily sensory quality and limited location to area serviced by the left lateral cutaneous nerve without associated pain or motor symptoms.  Patient is working on weight loss.  Encouraged avoiding close fitting clothes at waist, including belts.   If progresses in location or quality or other sites of paresthetia develop, then patient if to contact Dr McDiarmid for futher diagnostic work-up.

## 2011-01-16 NOTE — Progress Notes (Signed)
  Subjective:    Patient ID: [name], female    DOB: [birth year], 65 y.o.   MRN: [id number]  HPI Wheezing Pt had asthma as a child.  Outgrew it by age 4 to 28.  Occassional wheezing noted. Had been given Alburterol inhler in past by Dr Lorenz Coaster.  Has not had to use it.  Notices wheezing at night.  No shortness or breath or coughing.  Does have occassional nasal drainge and allergic rhinitis symptoms.  No limitations in activity.  Takes water aerobics without difficulty.    Numbness in left thigh Onset several months ago. Persistent with occassions of increased prominence. More tingling than loss of sensation.   No leg weakness or pain in thigh or back. No numbness anywhere else. Numbness does not go below knee.  LOcated primarily on lateral surface.  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  Respiratory: Negative for apnea, choking and chest tightness.   Musculoskeletal: Negative for myalgias, back pain, joint swelling and arthralgias.  Skin: Negative.   Neurological: Negative for weakness and headaches.        Objective:   Physical Exam  Constitutional: She appears well-developed and well-nourished.       Abdominal pannus with some overhang of groin bilaterally.  Eyes: Conjunctivae are normal.  Cardiovascular: Normal rate, regular rhythm, normal heart sounds and intact distal pulses.   Pulmonary/Chest: Effort normal and breath sounds normal. No respiratory distress. She has no wheezes. She has no rales.  Neurological: She is alert. She has normal strength. She is not disoriented. A sensory deficit (left lateral thigh with decreased sensitivity to touch than right lateral thigh. ) is present. No cranial nerve deficit. She displays no Babinski's sign on the right side. She displays no Babinski's sign on the left  side.  Reflex Scores:      Patellar reflexes are 0 on the right side and 0 on the left side.      Achilles reflexes are 0 on the right side and 0 on the left side.      Normal gait   Skin: Skin is warm, dry and intact. No abrasion and no rash noted.  Psychiatric: She has a normal mood and affect. Her speech is normal and behavior is normal. Thought content normal. Cognition and memory are normal.          Assessment & Plan:

## 2011-01-16 NOTE — Assessment & Plan Note (Addendum)
Peak flow before albuterol two puffs inhaled via spacer was 350 L/min which is with 85 L/min of mean for women age 66 w/ ht 5' 3'' (420 L/min). Her Peak flow post-albuterol was 320 L/min (no improvement)/  I do not think that patient'w wheezing is due to asthma.  She is on PPI.  She is having some mild allergic rhinitis that she is treating successfully with OTC antihistamine.  No sinusitis symptoms.  Will monitor for now.  Pt will tell me if her condition worsens.

## 2011-01-29 ENCOUNTER — Ambulatory Visit (HOSPITAL_COMMUNITY)
Admission: RE | Admit: 2011-01-29 | Discharge: 2011-01-29 | Disposition: A | Discharge status: Home / Self Care | Payer: Medicare Other | Source: Ambulatory Visit | Attending: Obstetrics and Gynecology | Admitting: Obstetrics and Gynecology

## 2011-01-29 DIAGNOSIS — Z1231 Encounter for screening mammogram for malignant neoplasm of breast: Secondary | ICD-10-CM | POA: Insufficient documentation

## 2011-01-30 NOTE — Op Note (Signed)
[name], [name]                  ACCOUNT NO.:  [id number]   MEDICAL RECORD NO.:  [id number]          PATIENT TYPE:  AMB   LOCATION:  DSC                          FACILITY:  [facility]   PHYSICIAN:  Katy Fitch. Sypher, M.D. DATE OF BIRTH:  July 02, 1946   DATE OF PROCEDURE:  11/26/2008  DATE OF DISCHARGE:                               OPERATIVE REPORT   PREOPERATIVE DIAGNOSIS:  Entrapment neuropathy, median nerve, right  carpal tunnel.   POSTOPERATIVE DIAGNOSIS:  Entrapment neuropathy, median nerve, right  carpal tunnel.   OPERATION:  Release of right transverse carpal ligament.   OPERATING SURGEON:  Katy Fitch. Sypher, MD   ASSISTANT:  Marveen Reeks Dasnoit, PA-C   ANESTHESIA:  General by LMA.   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  She has a history of significant entrapment neuropathy  symptoms involving the right median nerve at wrist level.  She has  failed nonoperative measures.   Due to failure to response, she is brought to the operating room at this  time for release of the right transverse carpal ligament.   PROCEDURE IN DETAIL:  [name] was brought to the operating room and  placed in supine position upon the operating table.  Following the  induction of general anesthesia by LMA technique, the right arm was  prepped with Betadine soap and solution and sterilely draped.  A  pneumatic tourniquet was applied to the proximal brachium.   Following exsanguination of the right arm with Esmarch bandage, the  arterial tourniquet was inflated to 220 mmHg.  Procedure commenced with  a short incision in the line of the ring finger of the palm.  Subcutaneous tissues were carefully divided revealing the palmar fascia.  This was split longitudinally to the common sensory branch of the median  nerve.  These were followed back to transverse carpal ligament which was  gently isolated to  the median nerve.  The ligament was then released  along its ulnar border extending into the distal forearm.  This widely  opened the carpal canal.  No mass or predicaments were noted.  Bleeding  points along the margin of the released ligament were electrocauterized  with bipolar current followed by repair of the skin with intradermal 3-0  Prolene suture.   A compressive dressing applied with volar plaster splint maintaining the  wrist in 5 degrees dorsiflexion.   For aftercare, [name] is provided a prescription for Percocet 5 mg one  p.o. q.4-6 hours p.r.n. pain, 20 tablets without refill.  We will see  her back in followup in the office in 1 week.      Katy Fitch Sypher, M.D.  Electronically Signed     RVS/MEDQ  D:  11/26/2008  T:  11/26/2008  Job:  [id number]   cc:   Loraine Leriche A. Perini, M.D.  Reuben Likes, M.D.  Cynthia P. Romine, M.D.

## 2011-02-02 NOTE — H&P (Signed)
[name], [name]:  [id number]   MEDICAL RECORD NO.:  [id number]          PATIENT TYPE:  OIB   LOCATION:  5703                         FACILITY:  [facility]   PHYSICIAN:  [name], M.D.       DATE OF BIRTH:  05-Sep-1946   DATE OF ADMISSION:  10/11/2006  DATE OF DISCHARGE:                              HISTORY & PHYSICAL   PRIMARY CARE PHYSICIAN:  Dr. Lorenz Coaster.   CHIEF COMPLAINTS:  Diarrhea.   HISTORY OF PRESENT ILLNESS:  [name] is a 65 year old woman with  history of a trip that she took into the [place] and returned with a  diarrhea.  She had extensive evaluation in Dr. Melanee Spry office as well  as Dr. Kenna Gilbert office without any etiology for her diarrhea.  The patient  was brought today to [facility] for colonoscopy and she was  found to be profoundly hypokalemic.  We were called to admit the patient  for observation and a potassium replacement.   PAST MEDICAL HISTORY:  1. Gastroesophageal reflux disease.  2. Hypertension.  3. Hyperlipidemia.  4. History of umbilical hernia repair.  5. Anxiety disorder.  6. Depression.   SOCIAL HISTORY:  The patient is Jehovah's Witness, married and has 3  children.   ALLERGIES:  AMOXICILLIN and ZITHROMAX.   CURRENT MEDICATIONS:  1. Atenolol.  2. Sulfasalazine  3. Nexium 40 mg of daily.   FAMILY HISTORY:  The patient's father had an MI and stroke.  A maternal  aunt had uterine cancer.   REVIEW OF SYSTEMS:  Unobtainable, since the patient is heavily sedated  post procedure.   PHYSICAL EXAMINATION:  VITAL SIGNS:  Upon admission, temperature 98.9,  blood pressure 132/80, heart rate 68.  GENERAL:  There is a obese female, sedated post procedure, in no acute  distress.  CHEST:  Clear.  HEART:  Regular without murmurs, rubs or gallops.  ABDOMEN:  Soft, nontender.  Bowel sounds are present.  LOWER EXTREMITIES:  No edema.  SKIN:  Warm and dry without any suspicious rashes.  NEUROLOGICAL:  Exam cannot  be performed.   LABORATORY VALUES:  At time of admission, the patient's potassium level  is 2.6.   ASSESSMENT AND PLAN:  1. Severe hypokalemia, most likely secondary to diarrhea.  I will      admit the patient for 23-hour observation, obtain a magnesium      level, aggressively replete potassium orally and intravenously and      recheck a basic metabolic profile tomorrow.  2. Acute diarrhea.  This has been already extensively investigated      with stool studies and the colonoscopy without any clear etiology.      The patient is recommended to take Imodium to help her with the      frequency of the bowel movements and to report back to her primary      care physician.      [name], M.D.  Electronically Signed     SL/MEDQ  D:  10/11/2006  T:  10/12/2006  Job:  [id number]   cc:  Reuben Likes, M.D.  Anselmo Rod, M.D.

## 2011-02-02 NOTE — Discharge Summary (Signed)
[name], TAGLE NO.:  [id number]   MEDICAL RECORD NO.:  [id number]          PATIENT TYPE:  OIB   LOCATION:  5703                         FACILITY:  [facility]   PHYSICIAN:  Isidor Holts, M.D.  DATE OF BIRTH:  1946-07-07   DATE OF ADMISSION:  10/11/2006  DATE OF DISCHARGE:  10/12/2006                               DISCHARGE SUMMARY   Primary MD: Dr. Lorenz Coaster.  Primary Gastroenterologist: Dr. Anselmo Rod.   DISCHARGE DIAGNOSES:  1. Diarrhea, etiology uncertain.  2. Profound hypokalemia, secondary to #1 above.   DISCHARGE MEDICATIONS:  1. Loperamide 2 mg p.o. p.r.n. t.i.d.  2. K-Dur 40 mEq p.o. b.i.d.   PROCEDURES:  Colonoscopy October 11, 2006 by Dr. Charna Elizabeth. This  revealed patchy areas of loss of vascular markings. Biopsies were done.   CONSULTATIONS:  None.   ADMISSION HISTORY:  See H&P note of September 26, 2006 dictated by Dr.  [name]. However, in brief this is a 65 year old female, with history  of diarrhea illness, status post extensive evaluation on an outpatient  basis by gastroenterologist Dr. Charna Elizabeth. Underwent lower GI  endoscopy on September 26, 2006 at [facility], as a scheduled  procedure, per prior arrangement for workup of her diarrhea illness.  Routine laboratory examination demonstrated a profound hypokalemia of  2.6. She was therefore admitted for correction of electrolyte  abnormalities.   CLINICAL COURSE:  The patient underwent aggressive intravenous potassium  replacement. Magnesium levels were checked and found to be normal at  1.9. She had a diarrheal stool overnight; otherwise, had no other  symptoms.  By the a.m. of October 12, 2006 she was asymptomatic. The  patient's hydration status was fair. There were no new issues. Serum  potassium level was reasonable at 3.4. She was therefore considered  stable for discharge on oral potassium supplements, and was discharged  accordingly.   DISPOSITION:  The  patient was discharged on October 12, 2006.   DIET:  High-fiber diet.   ACTIVITY:  As tolerated.   FOLLOWUP INSTRUCTIONS:  The patient was instructed to follow up with Dr.  Lorenz Coaster, her primary M.D., next week. She has been instructed to call for  an appointment, and it is recommended that Dr. Lorenz Coaster recheck patient's  electrolytes in case further adjustment in her potassium supplementation  is indicated. The patient is also recommended to follow up with Dr.  Charna Elizabeth, gastroenterologist, in two weeks as originally scheduled.  All this has been communicated to patient, and she has verbalized  understanding.      Isidor Holts, M.D.  Electronically Signed     CO/MEDQ  D:  10/12/2006  T:  10/12/2006  Job:  [id number]   cc:   Anselmo Rod, M.D.  Reuben Likes, M.D.

## 2011-02-02 NOTE — Cardiovascular Report (Signed)
NAMEREMMIE, BEMBENEK NO.:  [id number]   MEDICAL RECORD NO.:  [id number]          PATIENT TYPE:  OIB   LOCATION:  2892                         FACILITY:  [facility]   PHYSICIAN:  Madaline Savage, M.D.DATE OF BIRTH:  08/28/1946   DATE OF PROCEDURE:  01/01/2007  DATE OF DISCHARGE:  01/01/2007                            CARDIAC CATHETERIZATION   PROCEDURES PERFORMED:  1. Selective coronary angiography by Judkins technique.  2. Retrograde left heart catheterization.  3. Left ventricular angiography.  4. AngioSeal closure of the right femoral artery.   COMPLICATIONS:  None.   ENTRY SITE:  Right femoral.   DYE USED:  Omnipaque.  The patient was premedicated for her history of  contrast dye reaction; she had no reaction during the case or  immediately following.   PATIENT PROFILE:  [name] is a 65 year old [employer] employee who has had  recent chest pain; and has mild obesity a history of reflux disorder  treated with Protonix; and a history of PVCs improved with Atenolol.  After discussing various options for evaluation of this chest pain.  We  both agreed that cardiac cath would be indicated and best.  It was  performed today, electively, without complications.   RESULTS/PRESSURES:  Left ventricular pressure was 136/60.  End-diastolic  pressure of 14.  Central aortic pressure was 135/65 with a mean of 95 no  aortic valve gradient by pullback technique.   ANGIOGRAPHIC RESULTS:  The patient's coronary arteries were  angiographically patent.  No lesions seen throughout her coronary  circulation.  Anatomically her LAD reached the cardiac apex; and there  bifurcated.  The LAD gave rise to a small proximal diagonal branch.  There was a medium-to-large intermediate ramus branch and a nondominant  circumflex branch.  Left main was normal; all three coronaries were  normal.  Right coronary artery was dominant and showed no lesions.  LV  angiography showed normal  contractility of all wall segments with  ejection fraction estimated at 60%.  No evidence of mitral fret valve  prolapse or regurgitation; and no evidence of LV thrombus.   FINAL IMPRESSION:  1. Angiographically patent coronary arteries.  2. Normal LV systolic function.  3. Successful AngioSeal closure of the right femoral artery.  4. No allergies manifested during this case with her previous history      of dye allergy.   PLAN:  The patient will be ambulated in 1-2 hours and discharged and  followed up as an outpatient.  We started her on Crestor 20 mg a day for  cholesterol-lowering; and we will plan to follow her along with the  excellent care Dr. Leslee Home.           ______________________________  Madaline Savage, M.D.     WHG/MEDQ  D:  01/01/2007  T:  01/01/2007  Job:  [id number]   cc:   Reuben Likes, M.D.  [facility]

## 2011-05-06 DIAGNOSIS — R112 Nausea with vomiting, unspecified: Secondary | ICD-10-CM | POA: Insufficient documentation

## 2011-05-08 ENCOUNTER — Inpatient Hospital Stay (HOSPITAL_COMMUNITY)
Admission: EM | Admit: 2011-05-08 | Discharge: 2011-05-11 | DRG: 343 | Disposition: A | Discharge status: Home / Self Care | Payer: Medicare Other | Attending: Surgery | Admitting: Surgery

## 2011-05-08 DIAGNOSIS — I10 Essential (primary) hypertension: Secondary | ICD-10-CM | POA: Diagnosis present

## 2011-05-08 DIAGNOSIS — R197 Diarrhea, unspecified: Secondary | ICD-10-CM | POA: Diagnosis present

## 2011-05-08 DIAGNOSIS — E7800 Pure hypercholesterolemia, unspecified: Secondary | ICD-10-CM | POA: Diagnosis present

## 2011-05-08 DIAGNOSIS — K219 Gastro-esophageal reflux disease without esophagitis: Secondary | ICD-10-CM | POA: Diagnosis present

## 2011-05-08 DIAGNOSIS — K3580 Unspecified acute appendicitis: Principal | ICD-10-CM | POA: Diagnosis present

## 2011-05-09 ENCOUNTER — Emergency Department (HOSPITAL_COMMUNITY): Payer: Medicare Other

## 2011-05-09 LAB — CBC
HCT: 39.8 % (ref 36.0–46.0)
HCT: 34.2 % — ABNORMAL LOW (ref 36.0–46.0)
Hemoglobin: 14.7 g/dL (ref 12.0–15.0)
Hemoglobin: 12.2 g/dL (ref 12.0–15.0)
MCH: 32.5 pg (ref 26.0–34.0)
MCH: 31.6 pg (ref 26.0–34.0)
MCHC: 36.9 g/dL — ABNORMAL HIGH (ref 30.0–36.0)
MCHC: 35.7 g/dL (ref 30.0–36.0)
MCV: 87.9 fL (ref 78.0–100.0)
MCV: 88.6 fL (ref 78.0–100.0)
Platelets: 310 10*3/uL (ref 150–400)
Platelets: 278 10*3/uL (ref 150–400)
RBC: 4.53 MIL/uL (ref 3.87–5.11)
RBC: 3.86 MIL/uL — ABNORMAL LOW (ref 3.87–5.11)
RDW: 11.6 % (ref 11.5–15.5)
RDW: 12.2 % (ref 11.5–15.5)
WBC: 9.3 10*3/uL (ref 4.0–10.5)
WBC: 8.4 10*3/uL (ref 4.0–10.5)

## 2011-05-09 LAB — DIFFERENTIAL
Basophils Absolute: 0 10*3/uL (ref 0.0–0.1)
Basophils Absolute: 0 10*3/uL (ref 0.0–0.1)
Basophils Relative: 0 % (ref 0–1)
Basophils Relative: 0 % (ref 0–1)
Eosinophils Absolute: 1.8 10*3/uL — ABNORMAL HIGH (ref 0.0–0.7)
Eosinophils Absolute: 1.8 10*3/uL — ABNORMAL HIGH (ref 0.0–0.7)
Eosinophils Relative: 19 % — ABNORMAL HIGH (ref 0–5)
Eosinophils Relative: 21 % — ABNORMAL HIGH (ref 0–5)
Lymphocytes Relative: 19 % (ref 12–46)
Lymphocytes Relative: 27 % (ref 12–46)
Lymphs Abs: 1.8 10*3/uL (ref 0.7–4.0)
Lymphs Abs: 2.3 10*3/uL (ref 0.7–4.0)
Monocytes Absolute: 0.6 10*3/uL (ref 0.1–1.0)
Monocytes Absolute: 0.6 10*3/uL (ref 0.1–1.0)
Monocytes Relative: 7 % (ref 3–12)
Monocytes Relative: 7 % (ref 3–12)
Neutro Abs: 5.1 10*3/uL (ref 1.7–7.7)
Neutro Abs: 3.7 10*3/uL (ref 1.7–7.7)
Neutrophils Relative %: 55 % (ref 43–77)
Neutrophils Relative %: 44 % (ref 43–77)

## 2011-05-09 LAB — URINALYSIS, ROUTINE W REFLEX MICROSCOPIC
Bilirubin Urine: NEGATIVE
Glucose, UA: NEGATIVE mg/dL
Hgb urine dipstick: NEGATIVE
Ketones, ur: 15 mg/dL — AB
Leukocytes, UA: NEGATIVE
Nitrite: NEGATIVE
Protein, ur: NEGATIVE mg/dL
Specific Gravity, Urine: 1.017 (ref 1.005–1.030)
Urobilinogen, UA: 0.2 mg/dL (ref 0.0–1.0)
pH: 5 (ref 5.0–8.0)

## 2011-05-09 LAB — COMPREHENSIVE METABOLIC PANEL
ALT: 16 U/L (ref 0–35)
AST: 25 U/L (ref 0–37)
Albumin: 3.5 g/dL (ref 3.5–5.2)
Alkaline Phosphatase: 181 U/L — ABNORMAL HIGH (ref 39–117)
BUN: 14 mg/dL (ref 6–23)
CO2: 20 mEq/L (ref 19–32)
Calcium: 9.7 mg/dL (ref 8.4–10.5)
Chloride: 104 mEq/L (ref 96–112)
Creatinine, Ser: 0.69 mg/dL (ref 0.50–1.10)
GFR calc Af Amer: >60 mL/min (ref >60)
GFR calc non Af Amer: >60 mL/min (ref >60)
Glucose, Bld: 99 mg/dL (ref 70–99)
Potassium: 4.5 mEq/L (ref 3.5–5.1)
Sodium: 135 mEq/L (ref 135–145)
Total Bilirubin: 0.4 mg/dL (ref 0.3–1.2)
Total Protein: 7.7 g/dL (ref 6.0–8.3)

## 2011-05-09 LAB — OCCULT BLOOD, POC DEVICE: Fecal Occult Bld: NEGATIVE

## 2011-05-09 LAB — CLOSTRIDIUM DIFFICILE BY PCR: Toxigenic C. Difficile by PCR: NEGATIVE

## 2011-05-09 LAB — LIPASE, BLOOD: Lipase: 18 U/L (ref 11–59)

## 2011-05-09 LAB — LACTIC ACID, PLASMA: Lactic Acid, Venous: 1 mmol/L (ref 0.5–2.2)

## 2011-05-09 MED ORDER — IOHEXOL 300 MG/ML  SOLN
100.0000 mL | Freq: Once | INTRAMUSCULAR | Status: AC | PRN
  Administered 2011-05-09: 100 mL via INTRAVENOUS

## 2011-05-10 ENCOUNTER — Other Ambulatory Visit (INDEPENDENT_AMBULATORY_CARE_PROVIDER_SITE_OTHER): Payer: Self-pay | Admitting: Surgery

## 2011-05-10 DIAGNOSIS — R110 Nausea: Secondary | ICD-10-CM

## 2011-05-10 DIAGNOSIS — R1031 Right lower quadrant pain: Secondary | ICD-10-CM

## 2011-05-10 DIAGNOSIS — K3580 Unspecified acute appendicitis: Secondary | ICD-10-CM

## 2011-05-10 LAB — BASIC METABOLIC PANEL
BUN: 6 mg/dL (ref 6–23)
CO2: 20 mEq/L (ref 19–32)
Calcium: 9.3 mg/dL (ref 8.4–10.5)
Chloride: 108 mEq/L (ref 96–112)
Creatinine, Ser: 0.52 mg/dL (ref 0.50–1.10)
GFR calc Af Amer: >60 mL/min (ref >60)
GFR calc non Af Amer: >60 mL/min (ref >60)
Glucose, Bld: 96 mg/dL (ref 70–99)
Potassium: 3.5 mEq/L (ref 3.5–5.1)
Sodium: 138 mEq/L (ref 135–145)

## 2011-05-10 LAB — CBC
HCT: 34.8 % — ABNORMAL LOW (ref 36.0–46.0)
Hemoglobin: 12 g/dL (ref 12.0–15.0)
MCH: 30.2 pg (ref 26.0–34.0)
MCHC: 34.5 g/dL (ref 30.0–36.0)
MCV: 87.4 fL (ref 78.0–100.0)
Platelets: 304 10*3/uL (ref 150–400)
RBC: 3.98 MIL/uL (ref 3.87–5.11)
RDW: 11.8 % (ref 11.5–15.5)
WBC: 7.2 10*3/uL (ref 4.0–10.5)

## 2011-05-12 NOTE — Op Note (Signed)
NAMEELEANA, TOCCO NO.:  [id number]  MEDICAL RECORD NO.:  [id number]  LOCATION:  5155                         FACILITY:  [facility]  PHYSICIAN:  Wilmon Arms. Corliss Skains, M.D. DATE OF BIRTH:  1946/04/11  DATE OF PROCEDURE:  05/10/2011 DATE OF DISCHARGE:                              OPERATIVE REPORT   PREOPERATIVE DIAGNOSIS:  Acute appendicitis.  POSTOPERATIVE DIAGNOSIS:  Acute appendicitis.  PROCEDURES: 1. Laparoscopic lysis of adhesions. 2. Laparoscopic appendectomy.  SURGEON:  Wilmon Arms. Corliss Skains, MD  ANESTHESIA:  General.  INDICATIONS:  This is a 65 year old female who presented with some lower abdominal pain as well as diarrhea.  Initially, it was felt that she had colitis.  However, for the last couple days, her pain has worsened and is located in her right lower quadrant.  Although her white count was normal, we felt that she may have appendicitis as her initial CT scan showed a dilated appendix.  After discussion with the patient, we recommended laparoscopic appendectomy.  DESCRIPTION OF PROCEDURE:  The patient was brought to the operating room and placed in supine position on the operating room table.  After an adequate level of general anesthesia was obtained, a Foley catheter was placed in a sterile technique.  The patient's abdomen was prepped with ChloraPrep and draped in sterile fashion.  Time-out was taken to ensure the proper patient and proper procedure.  She has a previous infraumbilical incision from umbilical hernia repair.  She has also had a hysterectomy in the past.  We made a vertical incision 2 cm above the umbilicus.  Dissection was carried down to the fascia.  The fascia was divided vertically and we entered the peritoneal cavity bluntly.  A stay suture of 0-Vicryl was placed around the fascial opening.  The Hasson cannula was inserted and secured to stay suture.  Pneumoperitoneum was obtained by insufflating CO2 maintaining a maximal  pressure of 15 mmHg. The laparoscope was inserted.  There were dense adhesions in the lower abdomen.  I could not visualize the right lower quadrant or the left lower quadrant.  We could visualize the liver.  I placed a 5-mm port in the right upper quadrant.  We moved the scope to the right upper quadrant port site.  There were lot of adhesions of the omentum to the anterior abdominal wall.  We then spent the next 60 minutes taking down the adhesions.  I was able to place two 5-mm ports in the left side and using scissors as well as harmonic scalpel we were able to clear the omentum away from the anterior abdominal wall.  No bowel was noted to be involved.  Once we had finally cleared the adhesions, we mobilized the cecum medially.  We divided the lateral attachments of the cecum to the lateral abdominal wall.  We were able to identify the tip of the appendix.  This did look mildly injected and distended but was not perforated.  The appendix was quite tortuous and was adhered in the retrocecal location.  We spent a considerable amount of time lysing the adhesions and dissecting this very long appendix away from the cecum and its lateral attachments.  We  were finally able to identify the base of the appendix.  The mesoappendix was taken with a harmonic scalpel.  We divided the appendix with Endo-GIA stapler.  The appendix was placed in an EndoCatch sac and removed through the umbilical port site.  The staple line was inspected and was free of any bleeding or leak.  We irrigated the right lower quadrant thoroughly.  No bleeding was noted. We were removed the trocars as pneumoperitoneum was released.  The pursestring suture was used to close the umbilical fascia.  4-0 Monocryl was used to close the skin incisions.  Steri-Strips and clean dressings were applied.  The Foley catheter was removed.  The patient was extubated and brought to recovery in stable condition.  All sponge, instrument,  and needle counts were correct.     Wilmon Arms. Corliss Skains, M.D.     MKT/MEDQ  D:  05/10/2011  T:  05/10/2011  Job:  [id number]  Electronically Signed by Manus Rudd M.D. on 05/12/2011 08:18:55 PM

## 2011-05-14 ENCOUNTER — Inpatient Hospital Stay (HOSPITAL_COMMUNITY): Payer: Medicare Other

## 2011-05-14 ENCOUNTER — Inpatient Hospital Stay (HOSPITAL_COMMUNITY)
Admission: AD | Admit: 2011-05-14 | Discharge: 2011-05-18 | DRG: 392 | Disposition: A | Discharge status: Home / Self Care | Payer: Medicare Other | Source: Ambulatory Visit | Attending: Family Medicine | Admitting: Family Medicine

## 2011-05-14 ENCOUNTER — Encounter: Payer: Self-pay | Admitting: Family Medicine

## 2011-05-14 ENCOUNTER — Ambulatory Visit (HOSPITAL_COMMUNITY): Payer: Medicare Other | Admitting: Family Medicine

## 2011-05-14 VITALS — BP 137/82 | HR 96 | Temp 98.0°F | Wt 177.0 lb

## 2011-05-14 DIAGNOSIS — Z8249 Family history of ischemic heart disease and other diseases of the circulatory system: Secondary | ICD-10-CM

## 2011-05-14 DIAGNOSIS — E876 Hypokalemia: Secondary | ICD-10-CM | POA: Diagnosis present

## 2011-05-14 DIAGNOSIS — K5281 Eosinophilic gastritis or gastroenteritis: Secondary | ICD-10-CM | POA: Diagnosis present

## 2011-05-14 DIAGNOSIS — E669 Obesity, unspecified: Secondary | ICD-10-CM | POA: Diagnosis present

## 2011-05-14 DIAGNOSIS — R112 Nausea with vomiting, unspecified: Secondary | ICD-10-CM

## 2011-05-14 DIAGNOSIS — Z881 Allergy status to other antibiotic agents status: Secondary | ICD-10-CM

## 2011-05-14 DIAGNOSIS — E785 Hyperlipidemia, unspecified: Secondary | ICD-10-CM | POA: Diagnosis present

## 2011-05-14 DIAGNOSIS — K5289 Other specified noninfective gastroenteritis and colitis: Principal | ICD-10-CM | POA: Diagnosis present

## 2011-05-14 DIAGNOSIS — Z91041 Radiographic dye allergy status: Secondary | ICD-10-CM

## 2011-05-14 DIAGNOSIS — E0590 Thyrotoxicosis, unspecified without thyrotoxic crisis or storm: Secondary | ICD-10-CM | POA: Diagnosis present

## 2011-05-14 DIAGNOSIS — Z79899 Other long term (current) drug therapy: Secondary | ICD-10-CM

## 2011-05-14 DIAGNOSIS — Z888 Allergy status to other drugs, medicaments and biological substances status: Secondary | ICD-10-CM

## 2011-05-14 DIAGNOSIS — Z803 Family history of malignant neoplasm of breast: Secondary | ICD-10-CM

## 2011-05-14 DIAGNOSIS — K219 Gastro-esophageal reflux disease without esophagitis: Secondary | ICD-10-CM | POA: Diagnosis present

## 2011-05-14 DIAGNOSIS — Z823 Family history of stroke: Secondary | ICD-10-CM

## 2011-05-14 LAB — CBC
HCT: 34.2 % — ABNORMAL LOW (ref 36.0–46.0)
Hemoglobin: 12.4 g/dL (ref 12.0–15.0)
MCH: 31.3 pg (ref 26.0–34.0)
MCHC: 36.3 g/dL — ABNORMAL HIGH (ref 30.0–36.0)
MCV: 86.4 fL (ref 78.0–100.0)
Platelets: 334 10*3/uL (ref 150–400)
RBC: 3.96 MIL/uL (ref 3.87–5.11)
RDW: 11.7 % (ref 11.5–15.5)
WBC: 11.3 10*3/uL — ABNORMAL HIGH (ref 4.0–10.5)

## 2011-05-14 LAB — COMPREHENSIVE METABOLIC PANEL
ALT: 12 U/L (ref 0–35)
AST: 15 U/L (ref 0–37)
Albumin: 3.2 g/dL — ABNORMAL LOW (ref 3.5–5.2)
Alkaline Phosphatase: 151 U/L — ABNORMAL HIGH (ref 39–117)
BUN: 14 mg/dL (ref 6–23)
CO2: 25 mEq/L (ref 19–32)
Calcium: 9.4 mg/dL (ref 8.4–10.5)
Chloride: 105 mEq/L (ref 96–112)
Creatinine, Ser: 0.61 mg/dL (ref 0.50–1.10)
GFR calc Af Amer: >60 mL/min (ref >60)
GFR calc non Af Amer: >60 mL/min (ref >60)
Glucose, Bld: 142 mg/dL — ABNORMAL HIGH (ref 70–99)
Potassium: 3.8 mEq/L (ref 3.5–5.1)
Sodium: 136 mEq/L (ref 135–145)
Total Bilirubin: 0.5 mg/dL (ref 0.3–1.2)
Total Protein: 6.9 g/dL (ref 6.0–8.3)

## 2011-05-14 LAB — DIFFERENTIAL
Basophils Absolute: 0 10*3/uL (ref 0.0–0.1)
Basophils Relative: 0 % (ref 0–1)
Eosinophils Absolute: 3.8 10*3/uL — ABNORMAL HIGH (ref 0.0–0.7)
Eosinophils Relative: 34 % — ABNORMAL HIGH (ref 0–5)
Lymphocytes Relative: 16 % (ref 12–46)
Lymphs Abs: 1.8 10*3/uL (ref 0.7–4.0)
Monocytes Absolute: 0.6 10*3/uL (ref 0.1–1.0)
Monocytes Relative: 5 % (ref 3–12)
Neutro Abs: 5.1 10*3/uL (ref 1.7–7.7)
Neutrophils Relative %: 45 % (ref 43–77)

## 2011-05-14 LAB — LIPASE, BLOOD: Lipase: 39 U/L (ref 11–59)

## 2011-05-14 LAB — SEDIMENTATION RATE: Sed Rate: 33 mm/hr — ABNORMAL HIGH (ref 0–22)

## 2011-05-14 MED ORDER — PROMETHAZINE HCL 25 MG/ML IJ SOLN
12.5000 mg | Freq: Once | INTRAMUSCULAR | Status: AC
  Administered 2011-05-14: 12.5 mg via INTRAMUSCULAR

## 2011-05-14 NOTE — Progress Notes (Signed)
Subjective:    Patient ID: [name], female    DOB: 05-17-1946, 65 y.o.   MRN: [id number]  HPI CC: Nausea, vomiting, Diarrhea and periumbilical abdominal pain HPI: Onset of nausea and diarrhea approx. one week ago.  Was admitted on 05/09/11 by  [facility] of General surgical service with question of appendicitis based on pelvic CT finding of "Mildly distended appendix, measuring 0.8 cm and filled with fluid, with a small nonobstructing appendicolith and scattered small surrounding nodes. No significant associated soft tissue inflammation or free fluid".   Patient underwent laproscopic appendectomy on 8/23 by Dr Corliss Skains.  Pt was discharged on POD#1.  She started developing recurrence of the periumbilical abdominal discomfort and nausea with anorexia on 8/24.   The nausea/vomiting have persisted and occuring multiple times a day.  She developed worsening of the diarrhea have 5 to 6 watery stools a day. Has eaten only a few bites of food in last several days and has difficulty keeping down liquids. She vomited up clear liquid after drinking water in the office today.  (+) sensation of bloating in lower abdomen. Periumbilical pain/discomfort without radiation that is constant and has progressed in intensity over last two days.  She denies having a fever/chills.  No dysuria nor urinary frequency.  No bright red blood in BM or blood in emesis.  She has been hearing her own bowel sounds.   Past Surgical History  Procedure Date  . Abdominal hysterectomy 1988    partial  . Hernia repair     umbilical, repaired twice.   . Eye surgery     terygium  . Breast surgery     extraction  . Appendectomy   . Cardiac catheterization 12/2006    Dr Lacretia Nicks. Elsie Lincoln. No obstruction.  Normal LV  . Colonoscopy w/ biopsies 09/2006    Dx withDr Arty Baumgartner (GI): Dx with  Lymphocytic Colitis on biopsies. No polyps or masses.   . Trigger finger release     Release of stenosising tenosynovitis of thumbs  . Carpal tunnel  release     bilateral wrists.   . Gum surgery   . Tonsillectomy   . Tubal ligation 1975  . Dilation and curettage of uterus 1988  . Biopsy breast 09/2001  . Ultrasound of gall bladder with cck-injection 1999    normal  . Ugi barium 1999    normal   Patient Active Problem List  Diagnoses  . HYPERLIPIDEMIA  . OBESITY  . PREMATURE VENTRICULAR CONTRACTIONS  . SINUSITIS  . GASTROESOPHAGEAL REFLUX DISEASE  . HIATAL HERNIA  . ARTHRITIS, ACROMIOCLAVICULAR  . SPONDYLOSIS, CERVICAL  . SPONDYLOSIS, LUMBAR  . DEGENERATIVE DISC DISEASE, CERVICAL SPINE  . TROCHANTERIC BURSITIS, RIGHT  . TRIGGER FINGER, RIGHT THUMB  . PALPITATIONS, CHRONIC  . DEPRESSION, HX OF  . HYPOKALEMIA, HX OF  . ANEMIA, MILD, HX OF  . Other personal history of disorders of nervous system and sense organs  . ANAL FISSURE, HX OF  . OPHTHALMIC MIGRAINES, HX OF  . History of colon polyps  . Lymphocytic colitis  . Paresthesia of lower limb  . Wheezing symptom  . Nausea with vomiting        Review of Systems  Respiratory: Negative for shortness of breath.   Cardiovascular: Negative for chest pain.  See HPI     Objective:   Physical Exam  Constitutional: She appears listless. She is cooperative. She appears ill.  Cardiovascular: Normal heart sounds.  Tachycardia present.   Pulmonary/Chest: Effort normal  and breath sounds normal.  Abdominal: Soft. Bowel sounds are increased. There is no hepatosplenomegaly. There is tenderness (tender to moderate palpation left side abdomen.). There is no rigidity, no rebound and no guarding.    Neurological: She appears listless.   Wt Readings from Last 3 Encounters:  05/14/11 177 lb (80.287 kg)  01/15/11 184 lb (83.462 kg)  11/17/10 185 lb (83.915 kg)   Temp Readings from Last 3 Encounters:  05/14/11 98 F (36.7 C) Oral         BP Readings from Last 3 Encounters:  05/14/11 137/82  01/15/11 144/80  11/17/10 141/77   Pulse Readings from Last 3 Encounters:    05/14/11 96  01/15/11 104  05/01/10 72          Assessment & Plan:

## 2011-05-14 NOTE — Assessment & Plan Note (Addendum)
Onset nausea/vomiting with diarrhea about one week ago.  Similar to symptoms prior to appendectomy.  Surgical pathology final diagnosis of appendic with fibrous obliteration of appendiceal tip. Uncertain if this pathology consistent with appendicitis.  C diff PCR on 05/09/11 was negative.  Differential diagnosis is broad at this point in time, including a gastroenteritis,  bowel obstruction/Ileus, toxic-metabolic derangement. Etc..  Patient's history of lymphocytic colitis could explain the diarrhea and abdominal pain, the nausea/vomiting less so.   Plan: Admit to hospital. Check 2 view abdomen Xray to r/o obstruction/ileus.  Check CBC/CMET/Lipase/ESR.  Start IVF at 1.25 maintenance.  Antiemetic with phenergan and ondansetron as needed.  Likely consultation with patient's gastroenterologist in AM, Dr Arty Baumgartner.

## 2011-05-14 NOTE — H&P (Signed)
Family Medicine Teaching [facility] Admission History and Physical  Patient name: KASIDEE VOISIN Medical record number: [id number] Date of birth: 1945/10/31 Age: 65 y.o. Gender: female  Primary Care Provider: MCDIARMID,TODD D, MD  Chief Complaint: abdominal pain, nausea and diarrhea History of Present Illness: TIYE HUWE is a 65 y.o. year old female presenting with nausea, diarrhea and abdominal pain for 1 week. Patient presented with these symptoms one week ago and a pelvic CT showed "midly distended appendix, 0.8 cm filled with fluid, with small nonobstructing appendicolith and scattered small surrounding nodes. No evdience of soft tissue inflammation or free fluid." Patient had lap appendectomy for appendicitis by Dr. Margaree Mackintosh on 05/10/11.  Pt continued having periumbilical pain and nausea after surgery. Pain is periumbilical, not radiating and has become worst in the last couple of days. Patient complains of nausea with eating and decreased appetite. Has had vomiting associated with the nausea. Reports 5-6 episodes of non bloody watery diarrhea. Denies any antibiotic use prior to the start of symptoms. Denies any rash, any chills or fevers. Denies dysuria or polyuria.   Patient Active Problem List  Diagnoses  . HYPERLIPIDEMIA  . OBESITY  . PREMATURE VENTRICULAR CONTRACTIONS  . SINUSITIS  . GASTROESOPHAGEAL REFLUX DISEASE  . HIATAL HERNIA  . ARTHRITIS, ACROMIOCLAVICULAR  . SPONDYLOSIS, CERVICAL  . SPONDYLOSIS, LUMBAR  . DEGENERATIVE DISC DISEASE, CERVICAL SPINE  . TROCHANTERIC BURSITIS, RIGHT  . TRIGGER FINGER, RIGHT THUMB  . PALPITATIONS, CHRONIC  . DEPRESSION, HX OF  . HYPOKALEMIA, HX OF  . ANEMIA, MILD, HX OF  . Other personal history of disorders of nervous system and sense organs  . ANAL FISSURE, HX OF  . OPHTHALMIC MIGRAINES, HX OF  . History of colon polyps  . Lymphocytic colitis  . Paresthesia of lower limb  . Wheezing symptom  . Nausea with vomiting   Past Medical  History: Past Medical History  Diagnosis Date  . Anemia   . Irregular heart beat     Past Surgical History: Past Surgical History  Procedure Date  . Abdominal hysterectomy 1988    partial  . Hernia repair     umbilical, repaired twice.   . Eye surgery     terygium  . Breast surgery     extraction  . Appendectomy   . Cardiac catheterization 12/2006    Dr Lacretia Nicks. Elsie Lincoln. No obstruction.  Normal LV  . Colonoscopy w/ biopsies 09/2006    Dx withDr Arty Baumgartner (GI): Dx with  Lymphocytic Colitis on biopsies. No polyps or masses.   . Trigger finger release     Release of stenosising tenosynovitis of thumbs  . Carpal tunnel release     bilateral wrists.   . Gum surgery   . Tonsillectomy   . Tubal ligation 1975  . Dilation and curettage of uterus 1988  . Biopsy breast 09/2001  . Ultrasound of gall bladder with cck-injection 1999    normal  . Ugi barium 1999    normal    Social History: History   Social History  . Marital Status: Married    Spouse Name: N/A    Number of Children: N/A  . Years of Education: N/A   Occupational History  . retired [employer]   Social History Main Topics  . Smoking status: Never Smoker   . Smokeless tobacco: Never Used  . Alcohol Use: 0.5 oz/week    1 drink(s) per week  . Drug Use: No  .  Sexually Active: Not on file   Other Topics Concern  . Not on file   Social History Narrative   Lives with her husband, Jessica Priest grown children5 brothers and 4 sistersOccupation: retired, previously an Manufacturing engineer at [employer] petsEducation: collegeHeterosexualOwns a carPatient's Cell phone [phone]'s cell phone [phone] homeNever SmokedDrug use-noAlcohol use: 1-2 glasses wine per dayExercise: Yes, 5 days a week, walking, wieght lifting and danceAlways wears seatbeltsNo hx of STDSun exposure: rarely     Family History: Family History  Problem Relation Age of Onset  . Cancer Mother     Breast  .  Hearing loss Mother   . Heart disease Father   . Stroke Father   . Stroke Brother     Allergies: Allergies  Allergen Reactions  . Cefuroxime Rash  . Amoxicillin Other (See Comments)    Unknown reaction  . Azithromycin Other (See Comments)    Unknown  . Contrast Media (Iodinated Diagnostic Agents) Other (See Comments)    Unknown reaction  . Oxycodone Other (See Comments)    Unknown reaction  . Statins Other (See Comments)    Myalgias    No current facility-administered medications for this visit.   No current outpatient prescriptions on file.   Review Of Systems: Per HPI with the following additions: none Otherwise 12 point review of systems was performed and was unremarkable.  Physical Exam: Pulse: 94  Blood Pressure: 126/88 RR: 18   O2: 98% on RA Temp: 97.8  General: alert, cooperative and weak appearing and in discomfort HEENT: PERRLA and extra ocular movement intact Heart: S1, S2 normal, no murmur, rub or gallop, regular rhythm, tachycardic Lungs: clear to auscultation, no wheezes or rales and unlabored breathing Abdomen: soft, tenderness to palpation on left side of abdomen, no rebound, no guarding.  Extremities: extremities normal, atraumatic, no cyanosis or edema Skin:no rashes Neurology: normal without focal findings  Labs and Imaging: Lab Results  Component Value Date/Time   NA 136 05/14/2011  9:26 PM   K 3.8 05/14/2011  9:26 PM   CL 105 05/14/2011  9:26 PM   CO2 25 05/14/2011  9:26 PM   BUN 14 05/14/2011  9:26 PM   CREATININE 0.61 05/14/2011  9:26 PM   GLUCOSE 142* 05/14/2011  9:26 PM   Lab Results  Component Value Date   WBC 11.3* 05/14/2011   HGB 12.4 05/14/2011   HCT 34.2* 05/14/2011   MCV 86.4 05/14/2011   PLT 334 05/14/2011     Assessment and Plan: AYSE MCCARTIN is a 65 y.o. year old female presenting with abdominal pain, nausea/vomiting and diarrhea 1. Nausea/Vomiting: With history of recent surgery, this could be due to small bowel obstruction. Will  check abdominal xray. It could also be a gastroenteritis type picture, although the symptoms would be expected to be improving by now. Will start patient on zofran and phenergan.  2. Diarrhea: lymphocytic colitis vs irritable bowel disease vs Cdiff patient has history of lymphocytic colitis, which would explain the diarrhea but not necessarily the nausea and vomiting. C-diff PCR was negative on 08/22 and patient denies antibiotic use. Will check C-diff, lactoferin and hemoccult. Will consult patient's gastroenterologist: Dr. Loreta Ave. 3. Abdominal pain: patient allergic to morphine. Give tylenol for now.  2. FEN/GI: D51/2 NS 150cc/hr. Protonix 40mg  iv daily now. Clear liquid diet 3. Prophylaxis: heparin 5000u tid 4. Disposition: pending farther improvement

## 2011-05-15 ENCOUNTER — Encounter: Payer: Self-pay | Admitting: Family Medicine

## 2011-05-15 ENCOUNTER — Inpatient Hospital Stay (HOSPITAL_COMMUNITY): Payer: Medicare Other

## 2011-05-15 DIAGNOSIS — K5281 Eosinophilic gastritis or gastroenteritis: Secondary | ICD-10-CM

## 2011-05-15 DIAGNOSIS — R112 Nausea with vomiting, unspecified: Secondary | ICD-10-CM

## 2011-05-15 LAB — MAGNESIUM: Magnesium: 1.7 mg/dL (ref 1.5–2.5)

## 2011-05-15 LAB — OCCULT BLOOD X 1 CARD TO LAB, STOOL: Fecal Occult Bld: POSITIVE

## 2011-05-15 LAB — TROPONIN I: Troponin I: <0.3 ng/mL (ref <0.30)

## 2011-05-16 ENCOUNTER — Other Ambulatory Visit: Payer: Self-pay | Admitting: Gastroenterology

## 2011-05-16 LAB — DIFFERENTIAL
Basophils Absolute: 0 10*3/uL (ref 0.0–0.1)
Basophils Relative: 0 % (ref 0–1)
Eosinophils Absolute: 4.6 10*3/uL — ABNORMAL HIGH (ref 0.0–0.7)
Eosinophils Relative: 43 % — ABNORMAL HIGH (ref 0–5)
Lymphocytes Relative: 18 % (ref 12–46)
Lymphs Abs: 1.9 10*3/uL (ref 0.7–4.0)
Monocytes Absolute: 0.5 10*3/uL (ref 0.1–1.0)
Monocytes Relative: 5 % (ref 3–12)
Neutro Abs: 3.6 10*3/uL (ref 1.7–7.7)
Neutrophils Relative %: 34 % — ABNORMAL LOW (ref 43–77)

## 2011-05-16 LAB — GASTRIC OCCULT BLOOD (1-CARD TO LAB): Occult Blood, Gastric: POSITIVE — AB

## 2011-05-16 LAB — HEMOGLOBIN A1C
Hgb A1c MFr Bld: 5.4 % (ref <5.7)
Mean Plasma Glucose: 108 mg/dL (ref <117)

## 2011-05-16 LAB — TROPONIN I: Troponin I: <0.3 ng/mL (ref <0.30)

## 2011-05-16 LAB — COMPREHENSIVE METABOLIC PANEL
ALT: 9 U/L (ref 0–35)
AST: 13 U/L (ref 0–37)
Albumin: 2.9 g/dL — ABNORMAL LOW (ref 3.5–5.2)
Alkaline Phosphatase: 140 U/L — ABNORMAL HIGH (ref 39–117)
BUN: 5 mg/dL — ABNORMAL LOW (ref 6–23)
CO2: 25 mEq/L (ref 19–32)
Calcium: 9 mg/dL (ref 8.4–10.5)
Chloride: 107 mEq/L (ref 96–112)
Creatinine, Ser: 0.52 mg/dL (ref 0.50–1.10)
GFR calc Af Amer: >60 mL/min (ref >60)
GFR calc non Af Amer: >60 mL/min (ref >60)
Glucose, Bld: 100 mg/dL — ABNORMAL HIGH (ref 70–99)
Potassium: 3.1 mEq/L — ABNORMAL LOW (ref 3.5–5.1)
Sodium: 139 mEq/L (ref 135–145)
Total Bilirubin: 0.4 mg/dL (ref 0.3–1.2)
Total Protein: 6.1 g/dL (ref 6.0–8.3)

## 2011-05-16 LAB — CBC
HCT: 31.4 % — ABNORMAL LOW (ref 36.0–46.0)
Hemoglobin: 11 g/dL — ABNORMAL LOW (ref 12.0–15.0)
MCH: 30.5 pg (ref 26.0–34.0)
MCHC: 35 g/dL (ref 30.0–36.0)
MCV: 87 fL (ref 78.0–100.0)
Platelets: 310 10*3/uL (ref 150–400)
RBC: 3.61 MIL/uL — ABNORMAL LOW (ref 3.87–5.11)
RDW: 11.7 % (ref 11.5–15.5)
WBC: 10.6 10*3/uL — ABNORMAL HIGH (ref 4.0–10.5)

## 2011-05-16 LAB — TSH: TSH: 0.331 u[IU]/mL — ABNORMAL LOW (ref 0.350–4.500)

## 2011-05-16 LAB — GAMMA GT: GGT: 11 U/L (ref 7–51)

## 2011-05-16 LAB — GIARDIA/CRYPTOSPORIDIUM SCREEN(EIA)
Cryptosporidium Screen (EIA): NEGATIVE
Giardia Screen - EIA: NEGATIVE

## 2011-05-16 LAB — CLOSTRIDIUM DIFFICILE BY PCR: Toxigenic C. Difficile by PCR: NEGATIVE

## 2011-05-17 ENCOUNTER — Inpatient Hospital Stay (HOSPITAL_COMMUNITY): Payer: Medicare Other

## 2011-05-17 DIAGNOSIS — Z8701 Personal history of pneumonia (recurrent): Secondary | ICD-10-CM | POA: Insufficient documentation

## 2011-05-17 HISTORY — DX: Personal history of pneumonia (recurrent): Z87.01

## 2011-05-17 LAB — URINALYSIS, ROUTINE W REFLEX MICROSCOPIC
Bilirubin Urine: NEGATIVE
Glucose, UA: NEGATIVE mg/dL
Hgb urine dipstick: NEGATIVE
Ketones, ur: NEGATIVE mg/dL
Leukocytes, UA: NEGATIVE
Nitrite: NEGATIVE
Protein, ur: NEGATIVE mg/dL
Specific Gravity, Urine: 1.012 (ref 1.005–1.030)
Urobilinogen, UA: 0.2 mg/dL (ref 0.0–1.0)
pH: 5 (ref 5.0–8.0)

## 2011-05-17 LAB — BASIC METABOLIC PANEL
BUN: 5 mg/dL — ABNORMAL LOW (ref 6–23)
CO2: 27 mEq/L (ref 19–32)
Calcium: 9.3 mg/dL (ref 8.4–10.5)
Chloride: 107 mEq/L (ref 96–112)
Creatinine, Ser: 0.53 mg/dL (ref 0.50–1.10)
GFR calc Af Amer: >60 mL/min (ref >60)
GFR calc non Af Amer: >60 mL/min (ref >60)
Glucose, Bld: 94 mg/dL (ref 70–99)
Potassium: 4 mEq/L (ref 3.5–5.1)
Sodium: 139 mEq/L (ref 135–145)

## 2011-05-17 LAB — CBC
HCT: 32.2 % — ABNORMAL LOW (ref 36.0–46.0)
Hemoglobin: 11.3 g/dL — ABNORMAL LOW (ref 12.0–15.0)
MCH: 30.8 pg (ref 26.0–34.0)
MCHC: 35.1 g/dL (ref 30.0–36.0)
MCV: 87.7 fL (ref 78.0–100.0)
Platelets: 330 10*3/uL (ref 150–400)
RBC: 3.67 MIL/uL — ABNORMAL LOW (ref 3.87–5.11)
RDW: 11.9 % (ref 11.5–15.5)
WBC: 20.4 10*3/uL — ABNORMAL HIGH (ref 4.0–10.5)

## 2011-05-17 LAB — T3, FREE: T3, Free: 2.9 pg/mL (ref 2.3–4.2)

## 2011-05-17 LAB — T4, FREE: Free T4: 1.23 ng/dL (ref 0.80–1.80)

## 2011-05-18 DIAGNOSIS — K5281 Eosinophilic gastritis or gastroenteritis: Secondary | ICD-10-CM | POA: Insufficient documentation

## 2011-05-18 HISTORY — DX: Eosinophilic gastritis or gastroenteritis: K52.81

## 2011-05-18 LAB — BASIC METABOLIC PANEL
BUN: 9 mg/dL (ref 6–23)
CO2: 24 mEq/L (ref 19–32)
Calcium: 9.2 mg/dL (ref 8.4–10.5)
Chloride: 108 mEq/L (ref 96–112)
Creatinine, Ser: 0.51 mg/dL (ref 0.50–1.10)
GFR calc Af Amer: >60 mL/min (ref >60)
GFR calc non Af Amer: >60 mL/min (ref >60)
Glucose, Bld: 94 mg/dL (ref 70–99)
Potassium: 3.4 mEq/L — ABNORMAL LOW (ref 3.5–5.1)
Sodium: 139 mEq/L (ref 135–145)

## 2011-05-18 LAB — CBC
HCT: 28.7 % — ABNORMAL LOW (ref 36.0–46.0)
HCT: 28.3 % — ABNORMAL LOW (ref 36.0–46.0)
Hemoglobin: 10.2 g/dL — ABNORMAL LOW (ref 12.0–15.0)
Hemoglobin: 9.9 g/dL — ABNORMAL LOW (ref 12.0–15.0)
MCH: 30.9 pg (ref 26.0–34.0)
MCH: 30.5 pg (ref 26.0–34.0)
MCHC: 35.5 g/dL (ref 30.0–36.0)
MCHC: 35 g/dL (ref 30.0–36.0)
MCV: 87 fL (ref 78.0–100.0)
MCV: 87.1 fL (ref 78.0–100.0)
Platelets: 292 10*3/uL (ref 150–400)
Platelets: 304 10*3/uL (ref 150–400)
RBC: 3.3 MIL/uL — ABNORMAL LOW (ref 3.87–5.11)
RBC: 3.25 MIL/uL — ABNORMAL LOW (ref 3.87–5.11)
RDW: 11.8 % (ref 11.5–15.5)
RDW: 11.8 % (ref 11.5–15.5)
WBC: 12 10*3/uL — ABNORMAL HIGH (ref 4.0–10.5)
WBC: 9.6 10*3/uL (ref 4.0–10.5)

## 2011-05-18 LAB — DIFFERENTIAL
Basophils Absolute: 0 10*3/uL (ref 0.0–0.1)
Basophils Absolute: 0 10*3/uL (ref 0.0–0.1)
Basophils Relative: 0 % (ref 0–1)
Basophils Relative: 0 % (ref 0–1)
Eosinophils Absolute: 1.5 10*3/uL — ABNORMAL HIGH (ref 0.0–0.7)
Eosinophils Absolute: 1.4 10*3/uL — ABNORMAL HIGH (ref 0.0–0.7)
Eosinophils Relative: 13 % — ABNORMAL HIGH (ref 0–5)
Eosinophils Relative: 15 % — ABNORMAL HIGH (ref 0–5)
Lymphocytes Relative: 18 % (ref 12–46)
Lymphocytes Relative: 21 % (ref 12–46)
Lymphs Abs: 2 10*3/uL (ref 0.7–4.0)
Lymphs Abs: 2 10*3/uL (ref 0.7–4.0)
Monocytes Absolute: 0.5 10*3/uL (ref 0.1–1.0)
Monocytes Absolute: 0.7 10*3/uL (ref 0.1–1.0)
Monocytes Relative: 5 % (ref 3–12)
Monocytes Relative: 7 % (ref 3–12)
Neutro Abs: 7.3 10*3/uL (ref 1.7–7.7)
Neutro Abs: 5.6 10*3/uL (ref 1.7–7.7)
Neutrophils Relative %: 64 % (ref 43–77)
Neutrophils Relative %: 58 % (ref 43–77)

## 2011-05-18 LAB — GIARDIA/CRYPTOSPORIDIUM SCREEN(EIA)
Cryptosporidium Screen (EIA): NEGATIVE
Giardia Screen - EIA: NEGATIVE

## 2011-05-18 NOTE — H&P (Signed)
NAMEFREDDIE, [name]:  [id number]  MEDICAL RECORD NO.:  [id number]  LOCATION:  5155                         FACILITY:  [facility]  PHYSICIAN:  Wilmon Arms. Corliss Skains, M.D. DATE OF BIRTH:  1946/08/16  DATE OF ADMISSION:  05/08/2011 DATE OF DISCHARGE:                             HISTORY & PHYSICAL   PRIMARY CARE PHYSICIAN:  Leighton Roach McDiarmid, MD  CHIEF COMPLAINT:  Abdominal pain.  HISTORY OF PRESENT ILLNESS:  [name] is a 65 year old black female with a history of hypertension and a history of colitis in 2004-2005.  He developed nausea and diarrhea on Sunday.  She complained of some mild suprapubic pressure and no significant pain.  Her diarrhea and nausea has persisted over the last several days.  She denies any fevers or chills.  She presents to the emergency department secondary to continued problems.  Upon arrival, she had a workup, which revealed normal labs. She had a CT scan which shows a mildly distended appendix measuring 0.8 cm and is filled with fluid.  There is a nonobstructing appendicolith. There is no significant soft tissue inflammation or free fluid. Depending on clinical presentation, this may reflect very mild appendicitis.  Because of this finding, we were asked to evaluate the patient.  PAST MEDICAL HISTORY: 1. Hypertension. 2. History of colitis. 3. Vertigo. 4. GERD 5. Hypercholesterolemia.  PAST SURGICAL HISTORY: 1. Umbilical hernia repair. 2. Tubal ligation. 3. Partial hysterectomy.  REVIEW OF SYSTEMS:  Please see HPI, otherwise all other systems have been reviewed and are negative.  FAMILY HISTORY:  Noncontributory.  SOCIAL HISTORY:  The patient denies any tobacco.  She seldomly drinks alcohol.  She is retired.  She is married and has 3 daughters.  ALLERGIES: 1. AMOXICILLIN. 2. CEFUROXIME. 3. OXYCODONE. 4. SHELL FISH. 5. TRAMADOL. 6. VICODIN. 7. ZITHROMAX.  MEDICATIONS AT HOME:  Although doses are unknown: 1.  Atenolol. 2. Calcium 3. Fish oil. 4. Klor-Con. 5. Multivitamins.  PHYSICAL EXAMINATION:  GENERAL:  [name] is a very pleasant 65 year old black female who is well developed, well nourished, in no acute distress. VITAL SIGNS:  Temperature 98.2, pulse 91, blood pressure 102/53, respirations 18. HEENT:  Head is normocephalic, atraumatic, Sclarea noninjected.  Pupils are equal, round and reactive to light.  Ears and nose without any obvious masses or lesions.  No rhinorrhea.  Mouth is pink.  Throat shows no exudate. HEART:  Regular rate and rhythm.  Normal S1, S2.  No murmurs, gallops, or rubs are noted.  She does have palpable carotid, radial and pedal pulses bilaterally. LUNGS:  Clear to auscultation bilaterally with no wheezes, rhonchi or rales noted.  Respiratory effort is nonlabored. ABDOMEN:  Soft with active bowel sounds.  She is nondistended.  She has mild suprapubic tenderness and minimal right lower quadrant tenderness. She does have visible scars noted from her prior surgeries with no hernias or masses. SKIN:  Warm and dry with no mass, lesions, or rashes. PSYCHIATRY:  The patient is alert and oriented x3 with an appropriate affect.  LABS AND DIAGNOSTICS:  White blood cell count 9300, hemoglobin 14.7, hematocrit 39.8, platelet count is 310,000.  Sodium 135, potassium 4.5, glucose 99,  BUN 14, creatinine 0.69.  CT scan reveals a mildly distended appendix of 0.8 cm.  This is fluid filled with an obstructing appendicolith.  There is no signs of soft tissue stranding and based on clinical correlation, mild acute appendicitis may be considered.  IMPRESSION: 1. Abdominal pain/questionable appendicitis. 2. Diarrhea. 3. Hypertension. 4. Gastroesophageal reflux disease. 5. History of colitis. 6. Hypercholesterolemia.  PLAN:  At this time, we will get the patient admitted, start her on IV fluids.  We will keep her n.p.o. initially.  We will rule out Clostridium difficile  colitis, however, the patient does not have any risk factors including recent antibiotic use or contact from a hospital or nursing facility.  We will repeat labs in the morning.  We will observe the patient if her pain worsens.  She may require a diagnostic laparoscopy to evaluate for appendicitis.  However, if she begins to improve, we will advance her diet as she tolerates.     Letha Cape, PA   ______________________________ Wilmon Arms. Corliss Skains, M.D.    KEO/MEDQ  D:  05/10/2011  T:  05/10/2011  Job:  [id number]  cc:   Leighton Roach McDiarmid, M.D.  Electronically Signed by Barnetta Chapel PA on 05/14/2011 02:37:38 PM Electronically Signed by Manus Rudd M.D. on 05/18/2011 05:03:16 PM

## 2011-05-19 LAB — STOOL CULTURE

## 2011-05-19 LAB — URINE CULTURE
Colony Count: 75000
Culture  Setup Time: 201208302117
Special Requests: NEGATIVE

## 2011-05-21 LAB — CULTURE, BLOOD (ROUTINE X 2)
Culture  Setup Time: 201208281458
Culture  Setup Time: 201208281458
Culture: NO GROWTH
Culture: NO GROWTH

## 2011-05-22 NOTE — Discharge Summary (Signed)
  NAMEMELE, Madison NO.:  [id number]  MEDICAL RECORD NO.:  [id number]  LOCATION:  5155                         FACILITY:  [facility]  PHYSICIAN:  Wilmon Arms. Corliss Skains, M.D. DATE OF BIRTH:  1946/04/20  DATE OF ADMISSION:  05/08/2011 DATE OF DISCHARGE:                              DISCHARGE SUMMARY   DISCHARGE DIAGNOSES: 1. Acute appendicitis. 2. Hypertension. 3. Gastroesophageal reflux disease. 4. History of colitis. 5. History of vertigo. 6. Hypercholesterolemia.  PREVIOUS PROCEDURES:  Umbilical hernia repair, tubal ligation, and partial hysterectomy.  HISTORY:  This is a 65 year old African American female with a history of hypertension and colitis in 2004 and 2005 who developed nausea and diarrhea several days prior to her presentation.  She initially had periumbilically pain, but gradually developed increasing right lower quadrant pain.  She presented to the emergency department on May 09, 2011, secondary to continued pain, diarrhea, and workup at this time including a CT scan showed a mildly distended appendix, which was fluid filled.  There was a nonobstructing appendicolith.  There was no significant soft tissue inflammation or free fluid.  The patient was taken to the OR for laparoscopic appendectomy and lysis of adhesions on May 10, 2011, per Dr. Corliss Skains.  On the following morning, she was doing well with some continued abdominal distention and mild periumbilical tenderness, but was ambulatory and tolerating a regular diet.  It was suspected that she will likely be able to be discharged later on today should she continued to do well postoperatively.  MEDICATIONS AT THE TIME OF DISCHARGE:  Tylenol as needed for pain. Norco 5/325 one to two p.o. q.4 h. p.r.n. pain, #30, no refill.  She will continue home medications of atenolol 25 mg daily, aspirin 325 mg daily, calcium with vitamin D one daily, fish oil one capsule daily, potassium 20 mEq two  tablets twice daily with meals, multivitamins one daily, Protonix 40 mg one daily.  She will follow up with the [facility] on May 29, 2011 at 1:50 p.m. or sooner should she have difficulties in the interim.     Shawn Spencer, P.A.   ______________________________ Wilmon Arms. Corliss Skains, M.D.    SR/MEDQ  D:  05/11/2011  T:  05/11/2011  Job:  [id number]  cc:   [facility]  Electronically Signed by Lazaro Arms P.A. on 05/22/2011 08:59:08 AM Electronically Signed by Manus Rudd M.D. on 05/22/2011 01:06:27 PM

## 2011-05-24 NOTE — H&P (Signed)
[name], [name]:  [id number]  MEDICAL RECORD NO.:  [id number]  LOCATION:  5031                         FACILITY:  [facility]  PHYSICIAN:  Pearlean Brownie, M.D.DATE OF BIRTH:  Jun 23, 1946  DATE OF ADMISSION:  05/14/2011 DATE OF DISCHARGE:                             HISTORY & PHYSICAL   PRIMARY CARE PROVIDER:  Leighton Roach McDiarmid, MD  CHIEF COMPLAINT:  Abdominal pain, nausea, and diarrhea.  HISTORY OF PRESENT ILLNESS:  A 65 year old female presenting with nausea, diarrhea, and abdominal pain for 1 week.  The patient presented with these symptoms 1 weeks ago, and the pelvic CT showed a mildly distended appendix, 0.8 cm pelvic fluid, a small nonobstructing appendicolith and scattered small surrounding nodes.  No evidence of soft tissue inflammation or free fluid.  The patient underwent appendectomy for appendicitis by Dr. Corliss Skains on May 10, 2011.  The patient continued having periumbilical pain, nausea after surgery.  Pain is periumbilical, not radiating, and has become worse in the last couple of days.  The patient complains of nausea with eating and decreased appetite, had had vomiting associated with nausea.  The patient also reports 5-6 episodes of nonbloody, watery diarrhea.  Denies any antibiotic use prior to start of the symptoms.  Denies any rash, any chills, or fevers.  Denies dysuria or polyuria.  MEDICAL HISTORY:  Hyperlipidemia, history of premature ventricular contractions, gastroesophageal reflux disease, hiatal hernia, arthritis, history of mild anemia, history of mild hypokalemia, history of anal fissure, history of ophthalmic migraine, lymphocytic colitis.  PAST SURGICAL HISTORY: 1. Abdominal hysterectomy, partial in 1998. 2. Hernia repair, umbilical repair twice. 3. Eye surgery for pterygium. 4. Breast surgery, extraction. 5. Appendectomy. 6. Cardiac catheterization in 2008 by Dr. Aram Candela.  No obstruction.     Normal LV. 7.  Colonoscopy with biopsy, diagnosis with Dr. Loreta Ave diagnosed with     lymphocytic colitis on biopsy.  No polyps or masses. 8. Trigger finger release and release with tenolysis, tenosynovitis at     thumbs. 9. Carpal tunnel release, bilateral wrist. 10.Gum surgery. 11.Tonsillectomy. 12.Tubal ligation. 13.Dilation and curettage of the uterus in 1988. 14.Biopsy of the breast in January 2003. 15.Ultrasound of the gallbladder with CCK injection in 1999. 16.GI barium in 1998.  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  Smoking status:  Never smoked.  Alcohol use:  One drinks per week.  Drug use:  Denies.  FAMILY HISTORY:  Breast cancer in mother, hearing loss in mother, heart disease and stroke in father, stroke in brother.  ALLERGIES:  CEFUROXIME, rash; AMOXICILLIN, unknown reaction; AZITHROMYCIN, unknown reaction; CONTRAST MEDIA, unknown reaction; OXYCODONE, unknown action; STATIN, myalgias.  REVIEW OF SYSTEMS:  Negative except for HPI.  PHYSICAL EXAMINATION:  VITAL SIGNS:  Pulse 94, blood pressure 126/88, respiratory rate 18, O2 of 98% on room air, temperature 97.8. GENERAL:  Alert and cooperative, although weak appearing and in discomfort from the pain. HEENT:  Pupils equal, round, and reactive to light and accommodation. Extraocular movements intact. HEART:  S1 and S2 normal.  No murmur, rubs, or gallop.  Regular rhythm, tachycardic. LUNGS:  Clear to auscultation.  No wheezes or  rales, nonlabored breathing. ABDOMEN:  Soft, tender to palpation on left side of the abdomen.  No rebounds.  No guarding. EXTREMITIES:  Normal, atraumatic.  No cyanosis or edema. SKIN:  No rashes. NEUROLOGY:  Normal without focal findings.  LABORATORY DATA AND IMAGING:  Sodium 136, potassium 3.8, chloride 105, CO2 of 25, BUN 14, creatinine 0.61, glucose 142.  WBC 11.3, hemoglobin 12.4, hematocrit 34.2, MCV 84, platelets 334.  ASSESSMENT AND PLAN:  [name]  is a 65 year old female presenting with abdominal pain, nausea, vomiting, and diarrhea. 1. Nausea, vomiting with history of recent surgery.  This could be due     to small bowel obstruction, although symptoms presented before     surgery.  We will check abdominal x-ray.  It can also be a     gastroenteritis-type picture, although symptoms would be expected     to be improving by now.  We will start the patient on Zofran and     Phenergan for nausea control. 2. Diarrhea.  Lymphocytic colitis versus irritable bowel disease     versus Clostridium difficile.  The patient has history of     lymphocytic colitis, which would explain diarrhea, but not explain     the nausea and vomiting.  Clostridium difficile  PCR was negative     on May 09, 2011, and the patient denies antibiotic use.  We will     still check a Clostridium difficile PCR also check lactoferrin and     Hemoccult.  We will consult the patient's gastroenterologist, Dr.     Loreta Ave. 3. Abdominal pain.  The patient is allergic to MORPHINE, which is     excluded for pain control.  We will start the patient on Tylenol     for now. 4. Fluids, electrolytes, nutrition, gastrointestinal.  Start D5 half     normal saline at 150 mL per hour, start Protonix     40 mg IV daily, and start the patient on clear liquid diet. 5. Prophylaxis.  Heparin 5000 units t.i.d.  DISPOSITION:  Pending further improvement.    ______________________________ Marena Chancy, MD   ______________________________ Pearlean Brownie, M.D.    SL/MEDQ  D:  05/15/2011  T:  05/15/2011  Job:  [id number]  Electronically Signed by Marena Chancy MD on 05/19/2011 02:05:10 PM Electronically Signed by Pearlean Brownie M.D. on 05/24/2011 02:58:56 PM

## 2011-05-29 ENCOUNTER — Encounter (INDEPENDENT_AMBULATORY_CARE_PROVIDER_SITE_OTHER): Payer: Self-pay | Admitting: General Surgery

## 2011-05-29 ENCOUNTER — Ambulatory Visit (INDEPENDENT_AMBULATORY_CARE_PROVIDER_SITE_OTHER): Payer: Medicare Other | Admitting: General Surgery

## 2011-05-29 VITALS — BP 124/80 | HR 84 | Ht 64.0 in | Wt 180.6 lb

## 2011-05-29 DIAGNOSIS — K5289 Other specified noninfective gastroenteritis and colitis: Secondary | ICD-10-CM

## 2011-05-29 DIAGNOSIS — Z09 Encounter for follow-up examination after completed treatment for conditions other than malignant neoplasm: Secondary | ICD-10-CM

## 2011-05-29 DIAGNOSIS — K529 Noninfective gastroenteritis and colitis, unspecified: Secondary | ICD-10-CM

## 2011-05-29 NOTE — Patient Instructions (Signed)
Follow-up prn Follow-up with Dr. Loreta Ave

## 2011-05-29 NOTE — Progress Notes (Signed)
[name] [birth year] [id number] 05/29/2011   History of Present Illness: [name] is a  65 y.o. female who presents today status post lap appy.  Pathology reveals incidental appendix.  The patient is tolerating a regular diet, having bowel movements, has good pain control.  She  is back to most normal activities. She is continuing to have problems with her colitis.  She has seen Dr. Loreta Ave and has been placed on steroids for this.  Physical Exam: Abd: soft, nontender, active bowel sounds, nondistended.  All incisions are well healed.  Impression: 1. s/p lap appy, incidental appendix 2. colitis  Plan: She  is able to return to normal activities. She  may follow up on a prn basis.  She is to follow up with Dr. Loreta Ave for her colitis.

## 2011-06-01 NOTE — Consult Note (Signed)
NAMEESMIRNA, RAVAN NO.:  [id number]  MEDICAL RECORD NO.:  [id number]  LOCATION:  5031                         FACILITY:  [facility]  PHYSICIAN:  Jordan Hawks. Elnoria Howard, MD    DATE OF BIRTH:  04/28/1946  DATE OF CONSULTATION:  05/15/2011 DATE OF DISCHARGE:                                CONSULTATION   REASON FOR CONSULTATION:  Nausea and vomiting, abdominal pain and diarrhea.  REFERRING PHYSICIAN:  Leighton Roach McDiarmid, MD  HISTORY OF PRESENT ILLNESS:  This is a 65 year old female with a past medical history of hyperlipidemia, gastroesophageal reflux disease, arthritis, anemia, PVCs, and lymphocytic colitis diagnosed in 2008 was admitted to the hospital with complaints of abdominal pain, nausea, vomiting, and diarrhea.  The patient states that her symptoms started approximately 1 week ago, and she was presented to the emergency room at that time and CT scan revealed that there is a possibility of appendicitis.  She underwent surgical resection by Dr. Corliss Skains, and the pathology was negative for an acute appendicitis.  The pathology was read as fibrous obliteration of the appendiceal tip.  The patient did feel better after the surgery and subsequently she went home; however, her symptoms did return.  She reports having pain that can last for many hours.  It is periumbilical to upper abdominal in its location, and she does not know of any maneuvers that  truly alleviate the pain. Associated with these symptoms are nausea and vomiting and of late, she has had issues with diarrhea.  In 2008, she reports having similar symptoms, and she was diagnosed with lymphocytic colitis by Dr. Loreta Ave. She was treated with steroids, but I am uncertain if it was budesonide or prednisone.  However, she complained of having issues with facial swelling and did not tend to help with her diarrheal symptoms.  Per her report, her symptoms subsequently subsided spontaneously.  She has not had any  further issues until this time.  The patient denies any issues with hematemesis, hematochezia, or overt fever.  She denies any recent antibiotic use.  At her most recent hospitalization, she was checked for C. diff and it was negative by PCR.  As a result of her persistent symptoms, a GI consultation was requested for further evaluation and treatment.  PAST MEDICAL AND SURGICAL HISTORY:  As stated above.  FAMILY HISTORY:  Noncontributory.  SOCIAL HISTORY:  Negative for tobacco or illicit drug use.  She has very rare alcohol use.  ALLERGIES:  CEFUROXIME, AMOXICILLIN, AZITHROMYCIN, CONTRAST MEDIA, OXYCODONE, AND STATINS.  REVIEW OF SYSTEMS:  As stated above in history of present illness, otherwise negative.  MEDICATIONS: 1. Atenolol 25 mg p.o. daily. 2. Protonix 40 mg IV daily. 3. Tylenol 650 mg p.o. q.6 h. 4. Zofran 4 mg IV q.4 h. P.r.n. 5. Phenergan 12.5 mg IV q.4 h. p.r.n.  PHYSICAL EXAMINATION:  VITAL SIGNS:  Blood pressure is 129/69, heart rate is 65, respirations 18, temperature is 98.2. GENERAL:  The patient is in no acute distress, alert and oriented. HEENT:  Normocephalic, atraumatic.  Extraocular muscles intact. NECK:  Supple.  No lymphadenopathy. LUNGS:  Clear to auscultation bilaterally. CARDIOVASCULAR:  Regular rate and rhythm.  ABDOMEN:  Obese, soft, tender in the epigastric region as well as the periumbilical and also the surgical incision site.  No rebound or rigidity.  Positive bowel sounds. EXTREMITIES:  No clubbing, cyanosis, or edema.  LABORATORY VALUES:  White blood cell count 11.3, hemoglobin 12.4, MCV is 86.4, platelets 334, eosinophil is 34%, and absolute count is 3.8, ESR is 33.  Sodium 136, potassium 2.8, chloride 105, CO2 of 25, glucose 142, BUN 14, creatinine 0.6, total bilirubin is 0.5, alk phos 151, AST is 15, ALT 12, lipase 39.  Hemoccult is positive.  IMPRESSION: 1. Nausea, vomiting, abdominal pain. 2. Mild diarrhea. 3. Heme-positive  stools. 4. Eosinophilia. 5. Lymphocytic colitis.  The patient does have a history of     lymphocytic colitis, and I am uncertain if her current presentation     is that of her lymphocytic colitis.  In the past, she did have     similar types of complaints of abdominal pain, nausea, and vomiting     with her diarrhea, although she had more frequent diarrhea at that     time.  It is not common, but the patient is with lymphocytic     colitis/microcytic colitis, can present with nausea, vomiting, and     abdominal pain.  Interestingly, her absolute eosinophilia is noted     to be elevated, and this is a marked elevation.  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  This could explain her current     symptoms at this time also.  Plan is for an EGD with biopsies in the stomach and small intestine. Pending the biopsy results, further treatment for eosinophilic gastroenteritis versus that of lymphocytic colitis can be pursued.     Jordan Hawks Elnoria Howard, MD     PDH/MEDQ  D:  05/15/2011  T:  05/16/2011  Job:  [id number]  Electronically Signed by Jeani Hawking MD on 06/01/2011 08:49:18 AM

## 2011-06-07 ENCOUNTER — Ambulatory Visit (INDEPENDENT_AMBULATORY_CARE_PROVIDER_SITE_OTHER): Payer: Medicare Other | Admitting: Family Medicine

## 2011-06-07 ENCOUNTER — Encounter: Payer: Self-pay | Admitting: Family Medicine

## 2011-06-07 DIAGNOSIS — Z8639 Personal history of other endocrine, nutritional and metabolic disease: Secondary | ICD-10-CM

## 2011-06-07 DIAGNOSIS — K5281 Eosinophilic gastritis or gastroenteritis: Secondary | ICD-10-CM

## 2011-06-07 DIAGNOSIS — E039 Hypothyroidism, unspecified: Secondary | ICD-10-CM

## 2011-06-07 DIAGNOSIS — E038 Other specified hypothyroidism: Secondary | ICD-10-CM

## 2011-06-07 DIAGNOSIS — R6889 Other general symptoms and signs: Secondary | ICD-10-CM

## 2011-06-07 DIAGNOSIS — E0590 Thyrotoxicosis, unspecified without thyrotoxic crisis or storm: Secondary | ICD-10-CM

## 2011-06-07 DIAGNOSIS — Z8701 Personal history of pneumonia (recurrent): Secondary | ICD-10-CM

## 2011-06-07 DIAGNOSIS — R7989 Other specified abnormal findings of blood chemistry: Secondary | ICD-10-CM

## 2011-06-07 DIAGNOSIS — Z862 Personal history of diseases of the blood and blood-forming organs and certain disorders involving the immune mechanism: Secondary | ICD-10-CM

## 2011-06-07 LAB — TSH: TSH: 0.257 u[IU]/mL — ABNORMAL LOW (ref 0.350–4.500)

## 2011-06-07 LAB — BASIC METABOLIC PANEL WITH GFR
BUN: 11 mg/dL (ref 6–23)
CO2: 25 meq/L (ref 19–32)
Calcium: 9.7 mg/dL (ref 8.4–10.5)
Chloride: 107 meq/L (ref 96–112)
Creat: 0.7 mg/dL (ref 0.50–1.10)
Glucose, Bld: 91 mg/dL (ref 70–99)
Potassium: 5.1 meq/L (ref 3.5–5.3)
Sodium: 142 meq/L (ref 135–145)

## 2011-06-07 NOTE — Patient Instructions (Addendum)
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  You can read more about this condition at [email or web address].   We are checking your Eosinophils today to see if they have decreased to normal amount taking the Budesonide. We are checking your potassium level today because it was slightly low in the hospital.   Please come by the [facility] lab in mid-October to have your thyroid rechecked to make sure it is back in the normal range after your recent illness.  Call the day ahead to the [facility] to let them know you plan on coming in the next day.   I will contact Dr Kenna Gilbert office to see if you will stop the Budesonide after 3 months, or slowly taper down off of the medication.   If you develop fever, cough or shortness or breath, let Dr Damarian Priola know.  You may need a Chest X-ray.

## 2011-06-08 ENCOUNTER — Encounter: Payer: Self-pay | Admitting: Family Medicine

## 2011-06-08 DIAGNOSIS — R7989 Other specified abnormal findings of blood chemistry: Secondary | ICD-10-CM | POA: Insufficient documentation

## 2011-06-08 LAB — CBC WITH DIFFERENTIAL/PLATELET

## 2011-06-08 NOTE — Assessment & Plan Note (Addendum)
Improved symptoms with initiation of Budesonide oral.  Per pt paln by Dr Loreta Ave is for 3 months of Budesonide.  No dietary restrictions.  Will request records from Dr [facility] office to see if corticosteroid taper will be necessar.  If pt has recurence of symptoms of Eo GE will consider referral to Allergist for Foopd allergy test and elimation diet trial.   Pt will keep a food diarrhea to see if she is having a detectable pattern in post-prandial discomfort with particular food types.  CBC:    Component Value Date/Time   WBC 9.6 05/18/2011 1539   HGB 9.9* 05/18/2011 1539   HCT 28.3* 05/18/2011 1539   PLT 304 05/18/2011 1539   MCV 87.1 05/18/2011 1539   NEUTROABS 5.6 05/18/2011 1539   LYMPHSABS 2.0 05/18/2011 1539   MONOABS 0.7 05/18/2011 1539   EOSABS 1.4* 05/18/2011 1539   BASOSABS 0.0 05/18/2011 1539  Peripheral eosinophils still elevated today.

## 2011-06-08 NOTE — Progress Notes (Signed)
  Subjective:    Patient ID: [name], female    DOB: [birth year], 65 y.o.   MRN: [id number]  HPI Follow-up hospitalization for N/V and abdominal pain secondary to Eosinophilic Gastroenteritis.  Hospitalized 8/27 - 05/18/11.  EGD by Dr Elnoria Howard (GI) showed Eosinophilic infiltrates from biopsies of stomach and proximal duodenum.   Pt started on Entocort (budesonide tablets) inhouse by Dr Arty Baumgartner (GI) for the Eo GE.  Patient had significant improvement in nausea, diarrhea and abdominal pain.  Patient was to take Zytec per Dr Loreta Ave but patient did not take it b/c she did not know why she was to take it.  She tried a gluten free diet but found it unpalatable.  She reports that Dr Loreta Ave did not believe pt needed a gluten free diet.  Pt develpoed fever during hospitalization.  CXR found possible early infiltrate in left Lingula.  Patient tx'd with 5 days of Avelox which she has complted.  She denies fever, cough, sob, increase sputum.   Pt has been taking potssium supplement orally for low serum potassium inhouse.   Pt had a slightly low TSH with normal FT4 and FT3 during hospitalization that was thought to be either Euthyroid sick syndrome or evidence of subclinical hyperthyroidsm.   Review of Systems     Objective:   Physical Exam  Constitutional: She appears well-developed and well-nourished. No distress.  Neck: No thyromegaly present.  Cardiovascular: Normal rate, regular rhythm and normal heart sounds.   No murmur heard. Pulmonary/Chest: Effort normal and breath sounds normal. No respiratory distress. She has no wheezes. She has no rales.  Abdominal: Soft. Bowel sounds are normal. She exhibits no distension. There is no tenderness. There is no rebound.  Musculoskeletal: Normal range of motion.  Psychiatric: She has a normal mood and affect. Her behavior is normal. Thought content normal.          Assessment & Plan:

## 2011-06-08 NOTE — Assessment & Plan Note (Addendum)
CBC:    Component Value Date/Time   WBC 9.6 05/18/2011 1539   HGB 9.9* 05/18/2011 1539   HCT 28.3* 05/18/2011 1539   PLT 304 05/18/2011 1539   MCV 87.1 05/18/2011 1539   NEUTROABS 5.6 05/18/2011 1539   LYMPHSABS 2.0 05/18/2011 1539   MONOABS 0.7 05/18/2011 1539   EOSABS 1.4* 05/18/2011 1539   BASOSABS 0.0 05/18/2011 1539   Microcytic anemia, mild. Change from normal level last year.  Will recheck on next visit along with ferritin, ESR, anemia panel, retic count.  Recommend one Ferrous Sulfate 325 mg tablet daily on empty stomach if possible.  Pt to get her hemoglobin check in Mid-October when she comes in for her TSH recheck.

## 2011-06-08 NOTE — Assessment & Plan Note (Addendum)
Basic Metabolic Panel:    Component Value Date/Time   NA 142 06/07/2011 1003   K 5.1 06/07/2011 1003   CL 107 06/07/2011 1003   CO2 25 06/07/2011 1003   BUN 11 06/07/2011 1003   CREATININE 0.70 06/07/2011 1003   CREATININE 0.51 05/18/2011 0545   GLUCOSE 91 06/07/2011 1003   CALCIUM 9.7 06/07/2011 1003   Resolved hypokalemia.  Asked patient to decrease oral potassium supplement from twice a day to once a day.

## 2011-06-12 NOTE — Discharge Summary (Signed)
[name], [name]:  [id number]  MEDICAL RECORD NO.:  [id number]  LOCATION:  5031                         FACILITY:  [facility]  PHYSICIAN:  Pearlean Brownie, M.D.DATE OF BIRTH:  10-Jun-1946  DATE OF ADMISSION:  05/14/2011 DATE OF DISCHARGE:  05/18/2011                              DISCHARGE SUMMARY   PRIMARY CARE PROVIDER:  Leighton Roach McDiarmid, MD at [facility].  DISCHARGE DIAGNOSES: 1. Lymphocytic colitis. 2. Eosinophilic gastroenteritis. 3. Hyperlipidemia. 4. Hypokalemia. 5. Subclinical hyperthyroidism.  DISCHARGE MEDICATIONS: 1. Tylenol 650 mg every 6 hours as needed for pain. 2. Avelox 400 mg by mouth daily take for 5 days. 3. Budesonide 3 mg, take 3 tablets each morning daily. 4. Zyrtec 10 mg by mouth daily. 5. Atenolol 25 mg one tablet by mouth daily. 6. Calcium with vitamin D one tablet by mouth daily. 7. Fish oil 1200 mg one capsule by mouth twice a day. 8. Klor-Con potassium 20 mEq 2 tablets by mouth twice daily with     meals. 9. Multivitamin one tablet by mouth daily. 10.Protonix 40 mg one tablet by mouth daily.  MEDICATION STOPPED ON DISCHARGE:  Aspirin 325 mg one tablet by mouth daily.  CONSULTS:  Gastroenterology, Dr. Loreta Ave.  PROCEDURES: 1. Abdominal x-ray, no evidence of bowel obstruction or free     intraperitoneal air. 2. EGD on May 16, 2011, of report showed eosinophilic enteritis.     No evidence of villous blunting, active inflammation, dysplasia, or     malignancy.  Stomach biopsy showed eosinophilic gastritis with no     evidence of H. pylori, intestinal metaplasia, dysplasia, or     malignancy. 3. Chest x-ray on May 17, 2011, new mild lingular infiltrate or     atelectasis compared to chest x-ray on May 15, 2011.  LABORATORY DATA:  Complete metabolic panel on admission, sodium 136, potassium 3.8, chloride 105, CO2 of 25, glucose 142, BUN 14, creatinine 0.61.  Total bilirubin 0.5, alkaline  phosphatase 151, AST 15, ALT 12. Total protein 6.9, albumin 3.2, calcium 9.4.  CBC on admission, WBC 11.3, hemoglobin 12.4, hematocrit 34.2, platelet count 334, eosinophil 34%, absolute eosinophils 3.8.  Lipase 39, ESR 33.  Fecal occult blood positive.  Gastroccult positive.  Troponin x1 less than 0.30.  Troponin x2 less than 0.30.  Magnesium 1.7.  GGT 11.  Blood culture negative. EIA Giardia and EIA cryptosporidium negative.  C. diff negative.  HbA1c 5.4.  TSH 0.331, free T4 1.23, T3 2.9.  LABORATORY DATA ON DISCHARGE:  Basic metabolic panel; sodium 139, potassium 3.4, chloride 108, CO2 of 24, glucose 94, BUN 9, creatinine 0.51, calcium 9.2.  CBC; WBC 12, hemoglobin 10.2, hematocrit 28.7, platelet count 292.  Differential 13% eosinophils, 1.5 absolute eosinophil count.  Urine culture 75,000 colonies showing multiple bacterial morphotypes present, none predominate.  Urinalysis negative nitrites, negative leuks, negative blood, within normal limits.  Stool culture, no Salmonella, Shigella, and Campylobacter, or Yersinia isolated.  BRIEF HOSPITAL COURSE:  A 65 year old female with a history of lymphocytic colitis, hyperlipidemia, hypokalemia, and GERD who presented with abdominal pain, nausea, vomiting, and diarrhea.  1. Lymphocytic colitis.  After Gastroenterology consult, the patient  was found to have recurrence of lymphocytic colitis, which she had     been diagnosed with in the past.  She was started on budesonide 3     mg 3 pills per day and symptoms of diarrhea, nausea, and abdominal     pain improved. 2. Eosinophilic gastroenteritis.  The patient was found to have     elevated blood eosinophil counts.  An EGD showed presence of     eosinophilic enteritis and gastroenteritis.  Dr. Loreta Ave recommended     treating with budesonide and Zyrtec.  The patient also had     nutrition consult to get information about gluten free diet.  The     patient was discharged with information  about eosinophilic     gastroenteritis.  The patient was also discharged on Protonix 40 mg     by mouth daily. 3. Lung infiltrate.  The patient was found to be febrile with an     elevated white count after EGD.  Urinalysis was negative.  Chest x-     ray showed new mild lingular infiltrate that was thought to be     chemical pneumonitis.  The patient was discharged on Avelox for 5     days.  The patient was afebrile within normal white count on     discharge. 4. Hypokalemia.  The patient had baseline of 3.1 potassium during     hospitalization.  Was given potassium during hospitalization, at     discharge with potassium 20 mEq 2 pills b.i.d.  Potassium on     discharge was 3.4. 5. Hyperlipidemia.  The patient discharged on fish oil. 6. Subclinical hyperthyroidism.  TSH was mildly decreased.  T3, T4     were found to be normal to follow up as outpatient.  DISCHARGE INSTRUCTIONS: 1. Trial gluten free diet. 2. Avoidance of NSAIDs per gastroenterologist recommendation.  FOLLOWUP APPOINTMENTS: 1. The patient to follow up in 1 week with Dr. McDiarmid.  Follow up     on potassium, urine culture. 2. Follow up in 1-2 weeks with Dr. Loreta Ave.  The patient's contact     information Kierria Feigenbaum, Husband at [phone], [address]     in [place], [place].  Dyann Ruddle, daughter at [phone]     [phone].  DISCHARGE CONDITION:  The patient was discharged home in stable medical condition.    ______________________________ Marena Chancy, MD   ______________________________ Pearlean Brownie, M.D.    SL/MEDQ  D:  05/20/2011  T:  05/21/2011  Job:  [id number]  cc:   Anselmo Rod, MD, [facility]  Electronically Signed by Marena Chancy MD on 05/27/2011 03:36:18 PM Electronically Signed by Pearlean Brownie M.D. on 06/12/2011 11:04:14 AM

## 2011-07-05 ENCOUNTER — Other Ambulatory Visit: Payer: Medicare Other

## 2011-07-05 DIAGNOSIS — E0590 Thyrotoxicosis, unspecified without thyrotoxic crisis or storm: Secondary | ICD-10-CM

## 2011-07-05 DIAGNOSIS — Z862 Personal history of diseases of the blood and blood-forming organs and certain disorders involving the immune mechanism: Secondary | ICD-10-CM

## 2011-07-05 DIAGNOSIS — R7989 Other specified abnormal findings of blood chemistry: Secondary | ICD-10-CM

## 2011-07-05 LAB — CBC WITH DIFFERENTIAL/PLATELET
Basophils Absolute: 0 10*3/uL (ref 0.0–0.1)
Basophils Relative: 1 % (ref 0–1)
Eosinophils Absolute: 0.4 10*3/uL (ref 0.0–0.7)
Eosinophils Relative: 8 % — ABNORMAL HIGH (ref 0–5)
HCT: 35.8 % — ABNORMAL LOW (ref 36.0–46.0)
Hemoglobin: 11.9 g/dL — ABNORMAL LOW (ref 12.0–15.0)
Lymphocytes Relative: 27 % (ref 12–46)
Lymphs Abs: 1.3 10*3/uL (ref 0.7–4.0)
MCH: 31.4 pg (ref 26.0–34.0)
MCHC: 33.2 g/dL (ref 30.0–36.0)
MCV: 94.5 fL (ref 78.0–100.0)
Monocytes Absolute: 0.5 10*3/uL (ref 0.1–1.0)
Monocytes Relative: 11 % (ref 3–12)
Neutro Abs: 2.6 10*3/uL (ref 1.7–7.7)
Neutrophils Relative %: 54 % (ref 43–77)
Platelets: 304 10*3/uL (ref 150–400)
RBC: 3.79 MIL/uL — ABNORMAL LOW (ref 3.87–5.11)
RDW: 13.7 % (ref 11.5–15.5)
WBC: 4.8 10*3/uL (ref 4.0–10.5)

## 2011-07-05 LAB — TSH: TSH: 0.445 u[IU]/mL (ref 0.350–4.500)

## 2011-07-05 NOTE — Progress Notes (Signed)
CBC WITH DIFF AND TSH DONE TODAY [name] 

## 2011-07-10 ENCOUNTER — Encounter: Payer: Self-pay | Admitting: Family Medicine

## 2011-07-20 ENCOUNTER — Other Ambulatory Visit: Payer: Self-pay | Admitting: Family Medicine

## 2011-07-20 NOTE — Telephone Encounter (Signed)
Refill request

## 2011-08-23 ENCOUNTER — Encounter: Payer: Self-pay | Admitting: Family Medicine

## 2011-08-23 ENCOUNTER — Ambulatory Visit (INDEPENDENT_AMBULATORY_CARE_PROVIDER_SITE_OTHER): Payer: Medicare Other | Admitting: Family Medicine

## 2011-08-23 VITALS — BP 132/78 | HR 66 | Temp 97.5°F | Ht 64.0 in | Wt 184.0 lb

## 2011-08-23 DIAGNOSIS — Z23 Encounter for immunization: Secondary | ICD-10-CM

## 2011-08-23 DIAGNOSIS — R062 Wheezing: Secondary | ICD-10-CM

## 2011-08-23 MED ORDER — ALBUTEROL SULFATE HFA 108 (90 BASE) MCG/ACT IN AERS: 2.0000 | INHALATION_SPRAY | Freq: Four times a day (QID) | RESPIRATORY_TRACT | Status: DC | PRN

## 2011-08-23 NOTE — Assessment & Plan Note (Signed)
Benign exam today.  Suspect that this is an exacerbation of her GERD with expression of nocturnal wheezing and cough.  Plan: Empiric trial of Protonix 40 mg twice a day for two weeks.  If no improvement or worsening, then patient to notify Dr Steffanie Mingle.  If improved, then self-initiation of high-dose PPI for two week duration with flares of symptoms.

## 2011-08-23 NOTE — Progress Notes (Signed)
  Subjective:    Patient ID: [name], female    DOB: 1946-08-06, 65 y.o.   MRN: [id number]  HPI Nocturnal wheezing and cough Similar symptoms back in May that improved with increase dose of PPI. Onset recurrence ~ 1-2 months ago.l Almost exclusively when laying down at night.  Often accompanied by a NP cough. No recent URI symptoms. Gets short of breath after walking a mile. Not using albuterol that she has.  No leg swelling or pain.no smoking. No substerrnal chest pain. No post nasal drip. No rhinorrhea.  No facial pressure or pain. No nasal congestion.  (+) bilateral lower rib pain anteriorly intermittently. Last episode this am.  PMH: Eosinophilic gastroenteritis & Lymphocytic colitis. GERD. Hiatal Hernia.  Asthma as child. Hx of sinusitis. Medications reviewed and updated.  Review of Systems See HPI     Objective:   Physical Exam  Constitutional: Vital signs are normal. She is cooperative. She does not have a sickly appearance. No distress.  HENT:  Right Ear: Tympanic membrane and ear canal normal.  Left Ear: Tympanic membrane and ear canal normal.  Mouth/Throat: Uvula is midline, oropharynx is clear and moist and mucous membranes are normal. No oropharyngeal exudate, posterior oropharyngeal edema or posterior oropharyngeal erythema.  Eyes: Conjunctivae are normal.  Neck: No edema present. No mass and no thyromegaly present.  Cardiovascular: Normal rate, regular rhythm and normal heart sounds.  Exam reveals no gallop.   No murmur heard. Pulmonary/Chest: Effort normal and breath sounds normal. No accessory muscle usage.  Abdominal: She exhibits no distension. There is no hepatosplenomegaly. There is no tenderness.  Neurological: She is alert.          Assessment & Plan:

## 2011-08-23 NOTE — Patient Instructions (Signed)
I believe your coughing and wheezing at night is due to your acid reflux.  I would like you to increase your pantoprazole (protonix) to one capsule twice a day for next two weeks.  If you are not improved after two weeks, let Dr McDiarmid know.   I sent a prescription for your Albuterol inhaler to your pharmacy. Try it is your wheezing gets severe.

## 2011-10-15 ENCOUNTER — Other Ambulatory Visit: Payer: Self-pay | Admitting: Family Medicine

## 2011-10-15 ENCOUNTER — Encounter: Payer: Self-pay | Admitting: Family Medicine

## 2011-10-15 NOTE — Telephone Encounter (Signed)
Refill request

## 2011-12-05 ENCOUNTER — Encounter: Payer: Self-pay | Admitting: Home Health Services

## 2011-12-05 ENCOUNTER — Ambulatory Visit (INDEPENDENT_AMBULATORY_CARE_PROVIDER_SITE_OTHER): Payer: Medicare HMO | Admitting: Home Health Services

## 2011-12-05 VITALS — BP 124/77 | HR 87 | Temp 98.0°F | Ht 64.0 in | Wt 162.0 lb

## 2011-12-05 DIAGNOSIS — Z0000 Encounter for general adult medical examination without abnormal findings: Secondary | ICD-10-CM

## 2011-12-05 NOTE — Progress Notes (Signed)
Patient here for annual wellness visit, patient reports: Risk Factors/Conditions needing evaluation or treatment: Pt does not have any risk factors that need evaluation. Home Safety: Pt lives with husband Onalee Hua, in 1 story home.  Pt reports having smoke detectors and does not have adaptive equipment in bathroom. Other Information: Corrective lens: Pt wears daily corrective lens, visits eye doctor annually.  Dentures: Pt has full dentures. Memory: Pt reports some memory problems. Patient's Mini Mental Score (recorded in doc. flowsheet): 30  Balance/Gait: Pt does not have any noticeable impairment.  Balance Abnormal Patient value  Sitting balance    Sit to stand    Attempts to arise    Immediate standing balance    Standing balance    Nudge    Eyes closed- Romberg    Tandem stance    Back lean    Neck Rotation    360 degree turn    Sitting down     Gait Abnormal Patient value  Initiation of gait    Step length-left    Step length-right    Step height-left    Step height-right    Step symmetry    Step continuity    Path deviation    Trunk movement    Walking stance        Annual Wellness Visit Requirements Recorded Today In  Medical, family, social history Past Medical, Family, Social History Section  Current providers Care team  Current medications Medications  Wt, BP, Ht, BMI Vital signs  Tobacco, alcohol, illicit drug use History  ADL Nurse Assessment  Depression Screening Nurse Assessment  Cognitive impairment Nurse Assessment  Mini Mental Status Document Flowsheet  Fall Risk Nurse Assessment  Home Safety Progress Note  End of Life Planning (welcome visit) Social Documentation  Medicare preventative services Progress Note  Risk factors/conditions needing evaluation/treatment Progress Note  Personalized health advice Patient Instructions, goals, letter  Diet & Exercise Social Documentation  Emergency Contact Social Documentation  Seat Belts Social  Documentation  Sun exposure/protection Social Documentation    Prevention Plan:   Recommended Medicare Prevention Screenings Women over 65 Test For Frequency Date of Last- BOLD if needed  Breast Cancer 1-2 yrs 2012  Cervical Cancer 1-3 yrs Under care of OB  Colorectal Cancer 1-10 yrs 2008  Osteoporosis once recommended  Cholesterol 5 yrs 2011  Diabetes yearly 2013  HIV yearly declined  Influenza Shot yearly 2012  Pneumonia Shot once 2012  Zostavax Shot once 2007   ADDENDUM I have reviewed this visit and discussed with Arlys John and agree with her documentation. Todd McDiarmid, MD 12/06/11 @ 0800 hrs

## 2011-12-05 NOTE — Patient Instructions (Signed)
1. Start to take your blood pressure 2-3 times a week and keep a record. 2. Continue to work on weight loss. 3. Consider starting some sort of exercise routine 2-3 time a week. 4. Consider scheduling a bone density screening at [facility] [phone].

## 2011-12-06 ENCOUNTER — Encounter: Payer: Self-pay | Admitting: Home Health Services

## 2011-12-21 ENCOUNTER — Other Ambulatory Visit: Payer: Self-pay | Admitting: Obstetrics and Gynecology

## 2011-12-21 DIAGNOSIS — Z1231 Encounter for screening mammogram for malignant neoplasm of breast: Secondary | ICD-10-CM

## 2012-02-04 ENCOUNTER — Ambulatory Visit (HOSPITAL_COMMUNITY): Payer: Medicare HMO

## 2012-03-04 ENCOUNTER — Ambulatory Visit (HOSPITAL_COMMUNITY)
Admission: RE | Admit: 2012-03-04 | Discharge: 2012-03-04 | Disposition: A | Discharge status: Home / Self Care | Payer: Medicare HMO | Source: Ambulatory Visit | Attending: Obstetrics and Gynecology | Admitting: Obstetrics and Gynecology

## 2012-03-04 DIAGNOSIS — Z1231 Encounter for screening mammogram for malignant neoplasm of breast: Secondary | ICD-10-CM

## 2012-07-30 ENCOUNTER — Emergency Department (HOSPITAL_COMMUNITY)
Admission: EM | Admit: 2012-07-30 | Discharge: 2012-07-30 | Disposition: A | Discharge status: Home / Self Care | Payer: Medicare HMO | Source: Home / Self Care | Attending: Family Medicine | Admitting: Family Medicine

## 2012-07-30 ENCOUNTER — Encounter (HOSPITAL_COMMUNITY): Payer: Self-pay

## 2012-07-30 ENCOUNTER — Telehealth (HOSPITAL_COMMUNITY): Payer: Self-pay | Admitting: *Deleted

## 2012-07-30 DIAGNOSIS — K219 Gastro-esophageal reflux disease without esophagitis: Secondary | ICD-10-CM

## 2012-07-30 DIAGNOSIS — E876 Hypokalemia: Secondary | ICD-10-CM

## 2012-07-30 DIAGNOSIS — Z23 Encounter for immunization: Secondary | ICD-10-CM

## 2012-07-30 DIAGNOSIS — J029 Acute pharyngitis, unspecified: Secondary | ICD-10-CM

## 2012-07-30 LAB — POCT RAPID STREP A: Streptococcus, Group A Screen (Direct): NEGATIVE

## 2012-07-30 MED ORDER — PANTOPRAZOLE SODIUM 40 MG PO TBEC: DELAYED_RELEASE_TABLET | ORAL | Status: DC

## 2012-07-30 MED ORDER — DIPHENHYDRAMINE-ACETAMINOPHEN 12.5-325 MG/15ML PO LIQD: 15.0000 mL | Freq: Two times a day (BID) | ORAL | Status: DC | PRN

## 2012-07-30 NOTE — ED Notes (Signed)
C/o cough and neck pain for 1 week; using alcohol rubs for her neck pain

## 2012-07-30 NOTE — ED Notes (Signed)
[facility] on [address] called on VM about Benadryl 12.5 mg with APAP 325 mg./38ml. Take 15 ml. BID.  They want to clarify the medication. I called back and she said they don't have it in the liquid but they had Tylenol PM that has the Benadryl in it.  It is 25 mg./500 mg. I told her the doctor will sometimes write for OTC as a Rx. so pt. knows what to ask for. I told her if pt. can swallow the pill it will be OK to substitute it.  The pharmacist said she could take 1/2 tab to get the right amount of Benadryl.  I said that would be OK. Vassie Moselle 07/30/2012

## 2012-08-02 NOTE — ED Provider Notes (Signed)
History     CSN: [id number]  Arrival date & time 07/30/12  1311   First MD Initiated Contact with Patient 07/30/12 1419      Chief Complaint  Patient presents with  . Neck Pain    (Consider location/radiation/quality/duration/timing/severity/associated sxs/prior treatment) HPI Comments: 66 y/o female with h/o GERD here c/o non productive cough and sore throat for 1 week. Patient reports intermittent discomfort "described as pierce/pinch sensation" when swallowing in right side of her throat and neck.  She has had some mild nasal congestion and sneezing in last few weeks. Denies rhinorrhea, stridor, wheezing, fever or chills. No difficulty breathing. No chest or retrosternal pain. States she has not recently taken her PPI as she reports only takes it when symptomatic and has not had acid taste, or her usual symptoms of GERD recently. Denies abdominal pain, nausea, vomiting or diarrhea. No melena. Other than stated above throat discomfort denies difficulty swallowing solids or fluids. No undogested food regurgitations. No sudden weight loss. No fatigue or new symptoms above what is base line for her.    Past Medical History  Diagnosis Date  . Anemia   . Irregular heart beat   . 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 History of pneumonia 05/17/2011    Past Surgical History  Procedure Date  . Abdominal hysterectomy 1988    partial  . Hernia repair     umbilical, repaired twice.   . Eye surgery     terygium  . Breast surgery     extraction  . Appendectomy   . Cardiac catheterization 12/2006    Dr Lacretia Nicks. Elsie Lincoln. No obstruction.  Normal LV  . Colonoscopy w/ biopsies 09/2006    Dx withDr Arty Baumgartner (GI): Dx with  Lymphocytic Colitis on biopsies. No polyps or masses.   . Trigger finger release     Release of stenosising tenosynovitis of thumbs  . Carpal tunnel release     bilateral wrists.   . Gum surgery   . Tonsillectomy   . Tubal ligation 1975  . Dilation and curettage of uterus  1988  . Biopsy breast 09/2001  . Ultrasound of gall bladder with cck-injection 1999    normal  . Ugi barium 1999    normal    Family History  Problem Relation Age of Onset  . Cancer Mother     Breast  . Hearing loss Mother   . Heart disease Father   . Stroke Father   . Stroke Brother     History  Substance Use Topics  . Smoking status: Never Smoker   . Smokeless tobacco: Never Used  . Alcohol Use: 0.5 oz/week    1 drink(s) per week    OB History    Grav Para Term Preterm Abortions TAB SAB Ect Mult Living   3               Review of Systems  Constitutional: Negative for fever, chills, diaphoresis, activity change, appetite change, fatigue and unexpected weight change.  HENT: Positive for sore throat and neck pain.   Respiratory: Positive for cough. Negative for chest tightness, shortness of breath, wheezing and stridor.   Gastrointestinal: Negative for nausea, vomiting and abdominal pain.  Musculoskeletal: Negative for myalgias and arthralgias.  Skin: Negative for rash.  Neurological: Negative for dizziness and headaches.  All other systems reviewed and are negative.    Allergies  Cefuroxime; Amoxicillin; Azithromycin; Contrast media; Oxycodone; Phenergan; and Statins  Home Medications   Current Outpatient Rx  Name  Route  Sig  Dispense  Refill  . ASPIRIN 325 MG PO TBEC   Oral   Take 325 mg by mouth daily.           . ATENOLOL 25 MG PO TABS      TAKE 1 TABLET BY MOUTH ONCE DAILY   90 tablet   3   . CALCIUM CARBONATE-VITAMIN D 500-200 MG-UNIT PO TABS   Oral   Take 1 tablet by mouth daily.           . OMEGA-3 FATTY ACIDS 1000 MG PO CAPS   Oral   Take 1 g by mouth 2 (two) times daily.           Marland Kitchen KLOR-CON M20 20 MEQ PO TBCR      TAKE 2 TABLETS BY MOUTH EVERY MORNING AND 2 TABLETS BY MOUTH EVERY EVENING   360 tablet   3   . ONE-DAILY MULTI VITAMINS PO TABS   Oral   Take 1 tablet by mouth daily.           . ALBUTEROL SULFATE HFA 108 (90  BASE) MCG/ACT IN AERS   Inhalation   Inhale 2 puffs into the lungs every 6 (six) hours as needed.   8.5 g   2   . DIPHENHYDRAMINE-ACETAMINOPHEN 12.5-325 MG/15ML PO LIQD   Oral   Take 15 mLs by mouth 2 (two) times daily as needed.   1 Bottle   0   . PANTOPRAZOLE SODIUM 40 MG PO TBEC      1 tab po bid for 2 weeks then take daily as previously prescribed.   30 tablet   0     BP 129/52  Pulse 66  Temp 98.5 F (36.9 C) (Oral)  Resp 24  SpO2 100%  Physical Exam  Nursing note and vitals reviewed. Constitutional: She is oriented to person, place, and time. She appears well-developed and well-nourished. No distress.  HENT:  Head: Normocephalic and atraumatic.       Mild nasal congestion with erythema and swelling of nasal turbinates, no rhinorrhea. Pharyngeal erythema no exudates. No petechia, No uvula deviation or edema. No trismus. S/p tosillectomy. TM's normal.  Eyes: Conjunctivae normal are normal. Right eye exhibits no discharge. Left eye exhibits no discharge. No scleral icterus.  Neck: Neck supple. No JVD present. No thyromegaly present.  Cardiovascular: Normal rate, regular rhythm and normal heart sounds.   Pulmonary/Chest: Effort normal and breath sounds normal. No respiratory distress. She has no wheezes. She has no rales. She exhibits no tenderness.  Abdominal: Soft. There is no tenderness.  Lymphadenopathy:    She has no cervical adenopathy.  Neurological: She is alert and oriented to person, place, and time.  Skin: No rash noted. She is not diaphoretic.    ED Course  Procedures (including critical care time)   Labs Reviewed  POCT RAPID STREP A ([facility] CARE ONLY)   No results found.   1. GERD (gastroesophageal reflux disease)   2. Sore throat       MDM  Negative rapid stress test. Impress symptoms related to Acid reflux, possible mild allergic rhinits associated.  Restarted nexium 40 mg reccommended to use bid for 1-2 weeks then daily.  Prescribed  benadryl liquid 12.5mg /acetaminophen 325 mg prn. supportive care and red flags that should prompt her return to medical attention discussed with patient and provided in writing.  Asked to follow up with her PCP to monitor her symptoms and discussed if GI/referal endoscopic studies  are needed.         Sharin Grave, MD 08/02/12 [phone]

## 2012-09-08 ENCOUNTER — Encounter: Payer: Self-pay | Admitting: Family Medicine

## 2012-09-08 ENCOUNTER — Ambulatory Visit (INDEPENDENT_AMBULATORY_CARE_PROVIDER_SITE_OTHER): Payer: Medicare HMO | Admitting: Family Medicine

## 2012-09-08 VITALS — BP 144/81 | HR 67 | Temp 98.2°F | Ht 64.0 in | Wt 165.0 lb

## 2012-09-08 DIAGNOSIS — Z23 Encounter for immunization: Secondary | ICD-10-CM

## 2012-09-08 DIAGNOSIS — E876 Hypokalemia: Secondary | ICD-10-CM

## 2012-09-08 DIAGNOSIS — K219 Gastro-esophageal reflux disease without esophagitis: Secondary | ICD-10-CM

## 2012-09-09 ENCOUNTER — Encounter: Payer: Self-pay | Admitting: Family Medicine

## 2012-09-09 ENCOUNTER — Other Ambulatory Visit: Payer: Self-pay | Admitting: Family Medicine

## 2012-09-09 LAB — BASIC METABOLIC PANEL
BUN: 11 mg/dL (ref 6–23)
CO2: 27 mEq/L (ref 19–32)
Calcium: 9.6 mg/dL (ref 8.4–10.5)
Chloride: 108 mEq/L (ref 96–112)
Creat: 0.64 mg/dL (ref 0.50–1.10)
Glucose, Bld: 81 mg/dL (ref 70–99)
Potassium: 4.4 mEq/L (ref 3.5–5.3)
Sodium: 143 mEq/L (ref 135–145)

## 2012-09-09 MED ORDER — ATENOLOL 25 MG PO TABS: 25.0000 mg | ORAL_TABLET | Freq: Every day | ORAL | Status: DC

## 2012-09-09 NOTE — Assessment & Plan Note (Signed)
Sore throat improved with 2-week course of High-dose PPI.  No red flag symptoms needing further work up. Plan: pt mayself initiate her increase in PPI to 40 mg twice daily for two weeks at a time as needed.  Should she develop symptoms that do not respond or dysphagia or weight loss  Or melena, patient instructed to notifyour office.

## 2012-09-09 NOTE — Progress Notes (Signed)
  Subjective:    Patient ID: [name], female    DOB: 1945-10-09, 66 y.o.   MRN: [id number]  HPI Sore throat Recent evaluation [facility] for sore throat that improved with increase in PPI to high dose for two weeks. No dysphagia. No Indigestion. No weight loss. No melena.     Review of Systems See HPI     Objective:   Physical Exam        Assessment & Plan:

## 2012-12-02 ENCOUNTER — Other Ambulatory Visit (HOSPITAL_COMMUNITY): Payer: Self-pay | Admitting: Family Medicine

## 2012-12-04 ENCOUNTER — Other Ambulatory Visit: Payer: Self-pay | Admitting: Family Medicine

## 2012-12-11 ENCOUNTER — Encounter: Payer: Self-pay | Admitting: Home Health Services

## 2013-01-16 ENCOUNTER — Encounter: Payer: Self-pay | Admitting: Family Medicine

## 2013-04-03 ENCOUNTER — Other Ambulatory Visit (HOSPITAL_COMMUNITY): Payer: Self-pay | Admitting: Family Medicine

## 2013-04-29 ENCOUNTER — Other Ambulatory Visit: Payer: Self-pay | Admitting: Family Medicine

## 2013-04-29 DIAGNOSIS — Z1231 Encounter for screening mammogram for malignant neoplasm of breast: Secondary | ICD-10-CM

## 2013-05-06 ENCOUNTER — Ambulatory Visit (HOSPITAL_COMMUNITY): Payer: Medicare HMO

## 2013-05-08 ENCOUNTER — Ambulatory Visit (HOSPITAL_COMMUNITY)
Admission: RE | Admit: 2013-05-08 | Discharge: 2013-05-08 | Disposition: A | Discharge status: Home / Self Care | Payer: Medicare HMO | Source: Ambulatory Visit | Attending: Family Medicine | Admitting: Family Medicine

## 2013-05-08 DIAGNOSIS — Z1231 Encounter for screening mammogram for malignant neoplasm of breast: Secondary | ICD-10-CM | POA: Insufficient documentation

## 2013-05-16 ENCOUNTER — Encounter (HOSPITAL_COMMUNITY): Payer: Self-pay | Admitting: Emergency Medicine

## 2013-05-16 ENCOUNTER — Emergency Department (HOSPITAL_COMMUNITY)
Admission: EM | Admit: 2013-05-16 | Discharge: 2013-05-17 | Disposition: A | Discharge status: Home / Self Care | Payer: Medicare HMO | Attending: Emergency Medicine | Admitting: Emergency Medicine

## 2013-05-16 DIAGNOSIS — Z8719 Personal history of other diseases of the digestive system: Secondary | ICD-10-CM | POA: Insufficient documentation

## 2013-05-16 DIAGNOSIS — R110 Nausea: Secondary | ICD-10-CM | POA: Insufficient documentation

## 2013-05-16 DIAGNOSIS — R1031 Right lower quadrant pain: Secondary | ICD-10-CM

## 2013-05-16 DIAGNOSIS — Z8739 Personal history of other diseases of the musculoskeletal system and connective tissue: Secondary | ICD-10-CM | POA: Insufficient documentation

## 2013-05-16 DIAGNOSIS — Z79899 Other long term (current) drug therapy: Secondary | ICD-10-CM | POA: Insufficient documentation

## 2013-05-16 DIAGNOSIS — Z8701 Personal history of pneumonia (recurrent): Secondary | ICD-10-CM | POA: Insufficient documentation

## 2013-05-16 DIAGNOSIS — Z862 Personal history of diseases of the blood and blood-forming organs and certain disorders involving the immune mechanism: Secondary | ICD-10-CM | POA: Insufficient documentation

## 2013-05-16 DIAGNOSIS — Z7982 Long term (current) use of aspirin: Secondary | ICD-10-CM | POA: Insufficient documentation

## 2013-05-16 LAB — COMPREHENSIVE METABOLIC PANEL
ALT: 7 U/L (ref 0–35)
AST: 17 U/L (ref 0–37)
Albumin: 3.4 g/dL — ABNORMAL LOW (ref 3.5–5.2)
Alkaline Phosphatase: 107 U/L (ref 39–117)
BUN: 10 mg/dL (ref 6–23)
CO2: 25 mEq/L (ref 19–32)
Calcium: 9.4 mg/dL (ref 8.4–10.5)
Chloride: 106 mEq/L (ref 96–112)
Creatinine, Ser: 0.85 mg/dL (ref 0.50–1.10)
GFR calc Af Amer: 80 mL/min — ABNORMAL LOW (ref >90)
GFR calc non Af Amer: 69 mL/min — ABNORMAL LOW (ref >90)
Glucose, Bld: 101 mg/dL — ABNORMAL HIGH (ref 70–99)
Potassium: 3.9 mEq/L (ref 3.5–5.1)
Sodium: 139 mEq/L (ref 135–145)
Total Bilirubin: 0.2 mg/dL — ABNORMAL LOW (ref 0.3–1.2)
Total Protein: 7 g/dL (ref 6.0–8.3)

## 2013-05-16 LAB — CBC WITH DIFFERENTIAL/PLATELET
Basophils Absolute: 0 10*3/uL (ref 0.0–0.1)
Basophils Relative: 0 % (ref 0–1)
Eosinophils Absolute: 0.4 10*3/uL (ref 0.0–0.7)
Eosinophils Relative: 9 % — ABNORMAL HIGH (ref 0–5)
HCT: 31.8 % — ABNORMAL LOW (ref 36.0–46.0)
Hemoglobin: 10.9 g/dL — ABNORMAL LOW (ref 12.0–15.0)
Lymphocytes Relative: 29 % (ref 12–46)
Lymphs Abs: 1.4 10*3/uL (ref 0.7–4.0)
MCH: 31.1 pg (ref 26.0–34.0)
MCHC: 34.3 g/dL (ref 30.0–36.0)
MCV: 90.6 fL (ref 78.0–100.0)
Monocytes Absolute: 0.4 10*3/uL (ref 0.1–1.0)
Monocytes Relative: 8 % (ref 3–12)
Neutro Abs: 2.5 10*3/uL (ref 1.7–7.7)
Neutrophils Relative %: 53 % (ref 43–77)
Platelets: 284 10*3/uL (ref 150–400)
RBC: 3.51 MIL/uL — ABNORMAL LOW (ref 3.87–5.11)
RDW: 12.5 % (ref 11.5–15.5)
WBC: 4.8 10*3/uL (ref 4.0–10.5)

## 2013-05-16 LAB — LIPASE, BLOOD: Lipase: 24 U/L (ref 11–59)

## 2013-05-16 MED ORDER — FENTANYL CITRATE 0.05 MG/ML IJ SOLN: 50.0000 ug | INTRAMUSCULAR | Status: DC | PRN

## 2013-05-16 MED ORDER — SODIUM CHLORIDE 0.9 % IV SOLN
Freq: Once | INTRAVENOUS | Status: AC
  Administered 2013-05-17: 04:00:00 via INTRAVENOUS

## 2013-05-16 MED ORDER — ONDANSETRON HCL 4 MG/2ML IJ SOLN
4.0000 mg | Freq: Four times a day (QID) | INTRAMUSCULAR | Status: DC | PRN
  Filled 2013-05-16: qty 2

## 2013-05-16 NOTE — ED Provider Notes (Signed)
CSN: [id number]     Arrival date & time 05/16/13  2251 History   First MD Initiated Contact with Patient 05/16/13 2322     Chief Complaint  Patient presents with  . Abdominal Pain   (Consider location/radiation/quality/duration/timing/severity/associated sxs/prior Treatment) Patient is a 67 y.o. female presenting with abdominal pain. The history is provided by the patient.  Abdominal Pain Pain location:  RLQ Pain quality: sharp   Pain radiates to:  Does not radiate Pain severity:  Moderate Onset quality:  Gradual Duration:  12 hours Timing:  Constant Progression:  Unchanged Chronicity:  New Context: not diet changes, not recent travel, not sick contacts and not trauma   Relieved by:  Not moving and lying down Exacerbated by: palpation. Ineffective treatments:  None tried Associated symptoms: nausea   Associated symptoms: no belching, no chest pain, no chills, no constipation, no diarrhea, no dysuria, no fever, no hematuria, no melena, no shortness of breath, no vaginal bleeding and no vomiting   Risk factors: being elderly and multiple surgeries   prior appendectomy  Gradual onset this am and pain has been with her all day long  Past Medical History  Diagnosis Date  . Anemia   . Irregular heart beat   . 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 History of pneumonia 05/17/2011   Past Surgical History  Procedure Laterality Date  . Abdominal hysterectomy  1988    partial  . Hernia repair      umbilical, repaired twice.   . Eye surgery      terygium  . Breast surgery      extraction  . Appendectomy    . Cardiac catheterization  12/2006    Dr Lacretia Nicks. Elsie Lincoln. No obstruction.  Normal LV  . 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 No polyps or masses.   . Trigger finger release      Release of stenosising tenosynovitis of thumbs  . Carpal tunnel release      bilateral wrists.   . Gum surgery    . Tonsillectomy     . Tubal ligation  1975  . Dilation and curettage of uterus  1988  . Biopsy breast  09/2001  . Ultrasound of gall bladder with cck-injection  1999    normal  . Ugi barium  1999    normal   Family History  Problem Relation Age of Onset  . Cancer Mother     Breast  . Hearing loss Mother   . Heart disease Father   . Stroke Father   . Stroke Brother    History  Substance Use Topics  . Smoking status: Never Smoker   . Smokeless tobacco: Never Used  . Alcohol Use: 0.5 oz/week    1 drink(s) per week   OB History   Grav Para Term Preterm Abortions TAB SAB Ect Mult Living   3              Review of Systems  Constitutional: Negative for fever and chills.  HENT: Negative for neck pain and neck stiffness.   Eyes: Negative for pain.  Respiratory: Negative for shortness of breath.   Cardiovascular: Negative for chest pain.  Gastrointestinal: Positive for nausea and abdominal pain. Negative for vomiting, diarrhea, constipation and melena.  Genitourinary: Negative for dysuria, hematuria and vaginal bleeding.  Musculoskeletal: Negative for back pain.  Skin: Negative for rash.  Neurological: Negative for headaches.  All other systems reviewed  and are negative.    Allergies  Cefuroxime; Amoxicillin; Azithromycin; Contrast media; Oxycodone; Phenergan; and Statins  Home Medications   Current Outpatient Rx  Name  Route  Sig  Dispense  Refill  . albuterol (VENTOLIN HFA) 108 (90 BASE) MCG/ACT inhaler   Inhalation   Inhale 2 puffs into the lungs every 6 (six) hours as needed.   8.5 g   2   . aspirin 325 MG EC tablet   Oral   Take 325 mg by mouth daily.           Marland Kitchen atenolol (TENORMIN) 25 MG tablet   Oral   Take 1 tablet (25 mg total) by mouth daily.   90 tablet   3   . calcium-vitamin D (OSCAL WITH D 500-200) 500-200 MG-UNIT per tablet   Oral   Take 1 tablet by mouth daily.           . fish oil-omega-3 fatty acids 1000 MG capsule   Oral   Take 1 g by mouth 2 (two)  times daily.           Marland Kitchen KLOR-CON M20 20 MEQ tablet      TAKE 2 TABLETS BY MOUTH EVERY MORNING AND 2 TABLETS BY MOUTH EVERY EVENING   360 tablet   2   . Multiple Vitamin (MULTIVITAMIN) tablet   Oral   Take 1 tablet by mouth daily.           . pantoprazole (PROTONIX) 40 MG tablet      TAKE 1 TABLET BY MOUTH TWICE DAILY FOR 2 WEEKS THEN 1 TAB DAILY   30 tablet   1   . potassium chloride SA (KLOR-CON M20) 20 MEQ tablet               . Probiotic Product (PROBIOTIC ACIDOPHILUS) CAPS   Oral   Take by mouth.          BP 140/80  Pulse 75  Temp(Src) 98.4 F (36.9 C)  Resp 18  SpO2 98% Physical Exam  Constitutional: She is oriented to person, place, and time. She appears well-developed and well-nourished.  HENT:  Head: Normocephalic and atraumatic.  Eyes: EOM are normal. Pupils are equal, round, and reactive to light.  Neck: Neck supple.  Cardiovascular: Normal rate, regular rhythm and intact distal pulses.   Pulmonary/Chest: Effort normal and breath sounds normal. No respiratory distress. She exhibits no tenderness.  Abdominal: Soft. Bowel sounds are normal. There is no rebound and no guarding.  RLQ TTP, no tenderness otherwise Neg Murphys sign No CVAT  Musculoskeletal: Normal range of motion. She exhibits no edema.  Neurological: She is alert and oriented to person, place, and time.  Skin: Skin is warm and dry.    ED Course  Procedures (including critical care time) Labs Review  Results for orders placed during the hospital encounter of 05/16/13  CBC WITH DIFFERENTIAL      Result Value Range   WBC 4.8  4.0 - 10.5 K/uL   RBC 3.51 (*) 3.87 - 5.11 MIL/uL   Hemoglobin 10.9 (*) 12.0 - 15.0 g/dL   HCT 16.1 (*) 09.6 - 04.5 %   MCV 90.6  78.0 - 100.0 fL   MCH 31.1  26.0 - 34.0 pg   MCHC 34.3  30.0 - 36.0 g/dL   RDW 40.9  81.1 - 91.4 %   Platelets 284  150 - 400 K/uL   Neutrophils Relative % 53  43 - 77 %  Neutro Abs 2.5  1.7 - 7.7 K/uL   Lymphocytes  Relative 29  12 - 46 %   Lymphs Abs 1.4  0.7 - 4.0 K/uL   Monocytes Relative 8  3 - 12 %   Monocytes Absolute 0.4  0.1 - 1.0 K/uL   Eosinophils Relative 9 (*) 0 - 5 %   Eosinophils Absolute 0.4  0.0 - 0.7 K/uL   Basophils Relative 0  0 - 1 %   Basophils Absolute 0.0  0.0 - 0.1 K/uL  COMPREHENSIVE METABOLIC PANEL      Result Value Range   Sodium 139  135 - 145 mEq/L   Potassium 3.9  3.5 - 5.1 mEq/L   Chloride 106  96 - 112 mEq/L   CO2 25  19 - 32 mEq/L   Glucose, Bld 101 (*) 70 - 99 mg/dL   BUN 10  6 - 23 mg/dL   Creatinine, Ser 0.98  0.50 - 1.10 mg/dL   Calcium 9.4  8.4 - 11.9 mg/dL   Total Protein 7.0  6.0 - 8.3 g/dL   Albumin 3.4 (*) 3.5 - 5.2 g/dL   AST 17  0 - 37 U/L   ALT 7  0 - 35 U/L   Alkaline Phosphatase 107  39 - 117 U/L   Total Bilirubin 0.2 (*) 0.3 - 1.2 mg/dL   GFR calc non Af Amer 69 (*) >90 mL/min   GFR calc Af Amer 80 (*) >90 mL/min  URINALYSIS, ROUTINE W REFLEX MICROSCOPIC      Result Value Range   Color, Urine YELLOW  YELLOW   APPearance CLEAR  CLEAR   Specific Gravity, Urine 1.013  1.005 - 1.030   pH 5.5  5.0 - 8.0   Glucose, UA NEGATIVE  NEGATIVE mg/dL   Hgb urine dipstick NEGATIVE  NEGATIVE   Bilirubin Urine NEGATIVE  NEGATIVE   Ketones, ur NEGATIVE  NEGATIVE mg/dL   Protein, ur NEGATIVE  NEGATIVE mg/dL   Urobilinogen, UA 0.2  0.0 - 1.0 mg/dL   Nitrite NEGATIVE  NEGATIVE   Leukocytes, UA NEGATIVE  NEGATIVE  LIPASE, BLOOD      Result Value Range   Lipase 24  11 - 59 U/L   Ct Abdomen Pelvis Wo Contrast  05/17/2013   *RADIOLOGY REPORT*  Clinical Data: Right lower quadrant pain.  CT ABDOMEN AND PELVIS WITHOUT CONTRAST  Technique:  Multidetector CT imaging of the abdomen and pelvis was performed following the standard protocol without intravenous contrast.  Comparison: 05/09/2011.  Findings:  BODY WALL: Unremarkable.  LOWER CHEST:  Mediastinum: Coronary artery atherosclerosis.  Lungs/pleura: No consolidation.  ABDOMEN/PELVIS:  Liver: No focal abnormality.   Biliary: No evidence of biliary obstruction or stone.  Pancreas: Unremarkable.  Spleen: Unremarkable.  Adrenals: Unremarkable.  Kidneys and ureters: No hydronephrosis or stone. Unchanged 15 mm cyst in the right anterior hilar lip.  Bladder: Unremarkable.  Bowel: No obstruction. Appendectomy.  Redundant colon with numerous diverticular, not inflamed.  Retroperitoneum: Mildly prominent ileocolonic lymph nodes, unchanged from prior.  Peritoneum: No free fluid or gas.  Reproductive: Hysterectomy.  The base of the bladder appears low in the pelvis, suggesting pelvic floor laxity.  OSSEOUS: No acute abnormalities. SI joint osteoarthritis, right more than left.  Lower lumbar degenerative disc and facet disease. Grade 1 L5-S1 anterolisthesis.  IMPRESSION: No acute intra-abdominal findings.   Original Report Authenticated By: Tiburcio Pea     IVFs, IV fentanyl prn  4:44 AM on recheck, pain resolved. She is drinking water  and has ambulated to the bathroom no acute distress. Patient now relays concern about persistent heartburn despite taking PPI. She has seen gastroenterologists in the past and given her abdominal pain with reflux symptoms, she prefers to followup with GI. Referral to primary care physician as well. Patient declines any prescriptions for pain medications. Stable for discharge home  MDM  DX: ABD pain RLQ, resolved. Labs and CT scan reviewed as above Pain improved with IV narcotics Vital signs and nurses notes reviewed and considered   Sunnie Nielsen, MD 05/17/13 [phone]

## 2013-05-16 NOTE — ED Notes (Signed)
Pt. reports RLQ pain onset this morning with slight nausea , denies emesis , no fever or chills.

## 2013-05-17 ENCOUNTER — Encounter (HOSPITAL_COMMUNITY): Payer: Self-pay | Admitting: Emergency Medicine

## 2013-05-17 ENCOUNTER — Emergency Department (HOSPITAL_COMMUNITY): Payer: Medicare HMO

## 2013-05-17 LAB — URINALYSIS, ROUTINE W REFLEX MICROSCOPIC
Bilirubin Urine: NEGATIVE
Glucose, UA: NEGATIVE mg/dL
Hgb urine dipstick: NEGATIVE
Ketones, ur: NEGATIVE mg/dL
Leukocytes, UA: NEGATIVE
Nitrite: NEGATIVE
Protein, ur: NEGATIVE mg/dL
Specific Gravity, Urine: 1.013 (ref 1.005–1.030)
Urobilinogen, UA: 0.2 mg/dL (ref 0.0–1.0)
pH: 5.5 (ref 5.0–8.0)

## 2013-05-17 MED ORDER — FENTANYL CITRATE 0.05 MG/ML IJ SOLN: 50.0000 ug | INTRAMUSCULAR | Status: DC | PRN

## 2013-05-17 NOTE — ED Notes (Signed)
Patient given cranberry juice along with gram crackers and peanut butter

## 2013-05-17 NOTE — ED Notes (Signed)
Patient is resting comfortably.Family at bedside, denies pain

## 2013-07-31 ENCOUNTER — Encounter (HOSPITAL_COMMUNITY): Payer: Self-pay | Admitting: Emergency Medicine

## 2013-07-31 ENCOUNTER — Emergency Department (HOSPITAL_COMMUNITY)
Admission: EM | Admit: 2013-07-31 | Discharge: 2013-08-01 | Disposition: A | Discharge status: Home / Self Care | Payer: Medicare HMO | Attending: Emergency Medicine | Admitting: Emergency Medicine

## 2013-07-31 DIAGNOSIS — R0602 Shortness of breath: Secondary | ICD-10-CM | POA: Insufficient documentation

## 2013-07-31 DIAGNOSIS — Z79899 Other long term (current) drug therapy: Secondary | ICD-10-CM | POA: Insufficient documentation

## 2013-07-31 DIAGNOSIS — R5381 Other malaise: Secondary | ICD-10-CM | POA: Insufficient documentation

## 2013-07-31 DIAGNOSIS — R0789 Other chest pain: Secondary | ICD-10-CM | POA: Insufficient documentation

## 2013-07-31 DIAGNOSIS — R002 Palpitations: Secondary | ICD-10-CM | POA: Insufficient documentation

## 2013-07-31 DIAGNOSIS — R0989 Other specified symptoms and signs involving the circulatory and respiratory systems: Secondary | ICD-10-CM | POA: Insufficient documentation

## 2013-07-31 DIAGNOSIS — Z8701 Personal history of pneumonia (recurrent): Secondary | ICD-10-CM | POA: Insufficient documentation

## 2013-07-31 DIAGNOSIS — Z8719 Personal history of other diseases of the digestive system: Secondary | ICD-10-CM | POA: Insufficient documentation

## 2013-07-31 DIAGNOSIS — Z862 Personal history of diseases of the blood and blood-forming organs and certain disorders involving the immune mechanism: Secondary | ICD-10-CM | POA: Insufficient documentation

## 2013-07-31 DIAGNOSIS — R0609 Other forms of dyspnea: Secondary | ICD-10-CM | POA: Insufficient documentation

## 2013-07-31 DIAGNOSIS — Z7982 Long term (current) use of aspirin: Secondary | ICD-10-CM | POA: Insufficient documentation

## 2013-07-31 LAB — COMPREHENSIVE METABOLIC PANEL
ALT: 11 U/L (ref 0–35)
AST: 15 U/L (ref 0–37)
Albumin: 3.4 g/dL — ABNORMAL LOW (ref 3.5–5.2)
Alkaline Phosphatase: 102 U/L (ref 39–117)
BUN: 7 mg/dL (ref 6–23)
CO2: 25 mEq/L (ref 19–32)
Calcium: 8.6 mg/dL (ref 8.4–10.5)
Chloride: 104 mEq/L (ref 96–112)
Creatinine, Ser: 0.71 mg/dL (ref 0.50–1.10)
GFR calc Af Amer: >90 mL/min (ref >90)
GFR calc non Af Amer: 87 mL/min — ABNORMAL LOW (ref >90)
Glucose, Bld: 91 mg/dL (ref 70–99)
Potassium: 3.5 mEq/L (ref 3.5–5.1)
Sodium: 136 mEq/L (ref 135–145)
Total Bilirubin: 0.2 mg/dL — ABNORMAL LOW (ref 0.3–1.2)
Total Protein: 7 g/dL (ref 6.0–8.3)

## 2013-07-31 LAB — CBC WITH DIFFERENTIAL/PLATELET
Basophils Absolute: 0 10*3/uL (ref 0.0–0.1)
Basophils Relative: 0 % (ref 0–1)
Eosinophils Absolute: 0.3 10*3/uL (ref 0.0–0.7)
Eosinophils Relative: 8 % — ABNORMAL HIGH (ref 0–5)
HCT: 32.7 % — ABNORMAL LOW (ref 36.0–46.0)
Hemoglobin: 11.3 g/dL — ABNORMAL LOW (ref 12.0–15.0)
Lymphocytes Relative: 37 % (ref 12–46)
Lymphs Abs: 1.6 10*3/uL (ref 0.7–4.0)
MCH: 31.8 pg (ref 26.0–34.0)
MCHC: 34.6 g/dL (ref 30.0–36.0)
MCV: 92.1 fL (ref 78.0–100.0)
Monocytes Absolute: 0.4 10*3/uL (ref 0.1–1.0)
Monocytes Relative: 10 % (ref 3–12)
Neutro Abs: 1.9 10*3/uL (ref 1.7–7.7)
Neutrophils Relative %: 45 % (ref 43–77)
Platelets: 289 10*3/uL (ref 150–400)
RBC: 3.55 MIL/uL — ABNORMAL LOW (ref 3.87–5.11)
RDW: 12.7 % (ref 11.5–15.5)
WBC: 4.2 10*3/uL (ref 4.0–10.5)

## 2013-07-31 LAB — TROPONIN I: Troponin I: <0.3 ng/mL (ref <0.30)

## 2013-07-31 NOTE — ED Notes (Signed)
The pt has had a very irregular heart beat for the past several days.[name]  And more tired  Than usial.  Some chest pressure earlier none now

## 2013-07-31 NOTE — ED Provider Notes (Signed)
CSN: [id number]     Arrival date & time 07/31/13  2227 History   First MD Initiated Contact with Patient 07/31/13 2305     Chief Complaint  Patient presents with  . irregular heart beat    (Consider location/radiation/quality/duration/timing/severity/associated sxs/prior Treatment) HPI 67 year old female presents to emergency department with complaint of palpitations.  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  She reports since September she has had increased palpitations.  She has noticed that when she walks.  She has shortness of breath.  She normally experiences mild dyspnea about a quarter mile into her walk.  If she slows down or stops, the dyspnea resolves.  She reports that she gets palpitations at the end of her walk.  She has noticed increased fatigue over the last 3 months as well.  Tonight she and her family member had a fight, and after the argument, she noticed that she had increased palpitations and slight pressure in her chest.  She denies any nausea, diaphoresis, shortness of breath.  Symptoms lasted about 30 minutes, and then resolved.  Patient does not have history of coronary disease.  No family history of coronary disease.  She is followed by Dr. Katrinka Blazing with cardiology, but has not seen him in the last 3-4 years.  She has been on atenolol 25 mg since onset of PVCs in 1984.  Patient is currently asymptomatic.  She feels that her symptoms that were brought on by stress. Past Medical History  Diagnosis Date  . Anemia   . Irregular heart beat   . 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 History of pneumonia 05/17/2011   Past Surgical History  Procedure Laterality Date  . Abdominal hysterectomy  1988    partial  . Hernia repair      umbilical, repaired twice.   . Eye surgery      terygium  . Breast surgery      extraction  . Appendectomy    . Cardiac catheterization  12/2006    Dr Lacretia Nicks. Elsie Lincoln. No obstruction.  Normal LV  . 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 No polyps or masses.   . Trigger finger release      Release of stenosising tenosynovitis of thumbs  . Carpal tunnel release      bilateral wrists.   . Gum surgery    . Tonsillectomy    . Tubal ligation  1975  . Dilation and curettage of uterus  1988  . Biopsy breast  09/2001  . Ultrasound of gall bladder with cck-injection  1999    normal  . Ugi barium  1999    normal   Family History  Problem Relation Age of Onset  . Cancer Mother     Breast  . Hearing loss Mother   . Heart disease Father   . Stroke Father   . Stroke Brother    History  Substance Use Topics  . Smoking status: Never Smoker   . Smokeless tobacco: Never Used  . Alcohol Use: 0.5 oz/week    1 drink(s) per week   OB History   Grav Para Term Preterm Abortions TAB SAB Ect Mult Living   3              Review of Systems  See History of Present Illness; otherwise all other systems are reviewed and negative Allergies  Cefuroxime; Azithromycin; Contrast media; Oxycodone; Phenergan; Statins; and Amoxicillin  Home  Medications   Current Outpatient Rx  Name  Route  Sig  Dispense  Refill  . aspirin 325 MG tablet   Oral   Take 325 mg by mouth daily.         Marland Kitchen atenolol (TENORMIN) 25 MG tablet   Oral   Take 1 tablet (25 mg total) by mouth daily.   90 tablet   3   . CALCIUM PO   Oral   Take 1 tablet by mouth daily.         . Multiple Vitamin (MULTIVITAMIN WITH MINERALS) TABS tablet   Oral   Take 1 tablet by mouth daily.         . Omega-3 Fatty Acids (FISH OIL PO)   Oral   Take 1 capsule by mouth daily.         . pantoprazole (PROTONIX) 40 MG tablet   Oral   Take 40 mg by mouth daily as needed (for acis reflux).         . potassium chloride SA (K-DUR,KLOR-CON) 20 MEQ tablet   Oral   Take 20 mEq by mouth 2 (two) times daily.          Marland Kitchen albuterol (VENTOLIN HFA) 108 (90 BASE) MCG/ACT inhaler   Inhalation   Inhale 2 puffs into the  lungs every 6 (six) hours as needed.   8.5 g   2    BP 137/63  Pulse 60  Temp(Src) 98.6 F (37 C) (Oral)  Resp 16  Ht 5\' 4"  (1.626 m)  Wt 182 lb 1.6 oz (82.6 kg)  BMI 31.24 kg/m2  SpO2 100% Physical Exam  Nursing note and vitals reviewed. Constitutional: She is oriented to person, place, and time. She appears well-developed and well-nourished. No distress.  HENT:  Head: Normocephalic and atraumatic.  Right Ear: External ear normal.  Left Ear: External ear normal.  Nose: Nose normal.  Mouth/Throat: Oropharynx is clear and moist.  Eyes: Conjunctivae and EOM are normal. Pupils are equal, round, and reactive to light.  Neck: Normal range of motion. Neck supple. No JVD present. No tracheal deviation present. No thyromegaly present.  Cardiovascular: Normal rate, regular rhythm, normal heart sounds and intact distal pulses.  Exam reveals no gallop and no friction rub.   No murmur heard. Pulmonary/Chest: Effort normal and breath sounds normal. No stridor. No respiratory distress. She has no wheezes. She has no rales. She exhibits no tenderness.  Abdominal: Soft. Bowel sounds are normal. She exhibits no distension and no mass. There is no tenderness. There is no rebound and no guarding.  Musculoskeletal: Normal range of motion. She exhibits no edema and no tenderness.  Lymphadenopathy:    She has no cervical adenopathy.  Neurological: She is alert and oriented to person, place, and time. She exhibits normal muscle tone. Coordination normal.  Skin: Skin is warm and dry. No rash noted. No erythema. No pallor.  Psychiatric: She has a normal mood and affect. Her behavior is normal. Judgment and thought content normal.    ED Course  Procedures (including critical care time) Labs Review Labs Reviewed  CBC WITH DIFFERENTIAL - Abnormal; Notable for the following:    RBC 3.55 (*)    Hemoglobin 11.3 (*)    HCT 32.7 (*)    Eosinophils Relative 8 (*)    All other components within normal  limits  COMPREHENSIVE METABOLIC PANEL - Abnormal; Notable for the following:    Albumin 3.4 (*)    Total Bilirubin 0.2 (*)  GFR calc non Af Amer 87 (*)    All other components within normal limits  URINALYSIS, ROUTINE W REFLEX MICROSCOPIC - Abnormal; Notable for the following:    APPearance CLOUDY (*)    All other components within normal limits  TROPONIN I   Imaging Review Dg Chest 2 View  08/01/2013   CLINICAL DATA:  Tachycardia.  EXAM: CHEST  2 VIEW  COMPARISON:  Chest radiograph performed 05/17/2011  FINDINGS: The lungs are well-aerated and clear. There is no evidence of focal opacification, pleural effusion or pneumothorax.  The heart is borderline normal in size; the mediastinal contour is within normal limits. No acute osseous abnormalities are seen.  IMPRESSION: No acute cardiopulmonary process seen.   Electronically Signed   By: Roanna Raider M.D.   On: 08/01/2013 01:03    EKG Interpretation     Ventricular Rate:  65 PR Interval:  174 QRS Duration: 82 QT Interval:  402 QTC Calculation: 418 R Axis:   47 Text Interpretation:  Normal sinus rhythm Normal ECG No significant change since last tracing            MDM   1. Palpitations    67 year old female with 3 months of dyspnea on exertion, mild in nature.  Along with increased palpitations.  She had brief episode of chest pressure tonight with emotional distress.  EKG today shows a normal sinus rhythm.  Her lab work is unremarkable.  Will check chest x-ray given her complaint of dyspnea, and plan to have her followup with her primary care doctor and/or cardiologist.    Olivia Mackie, MD 08/01/13 [phone]

## 2013-08-01 ENCOUNTER — Emergency Department (HOSPITAL_COMMUNITY): Payer: Medicare HMO

## 2013-08-01 LAB — URINALYSIS, ROUTINE W REFLEX MICROSCOPIC
Bilirubin Urine: NEGATIVE
Glucose, UA: NEGATIVE mg/dL
Hgb urine dipstick: NEGATIVE
Ketones, ur: NEGATIVE mg/dL
Leukocytes, UA: NEGATIVE
Nitrite: NEGATIVE
Protein, ur: NEGATIVE mg/dL
Specific Gravity, Urine: 1.009 (ref 1.005–1.030)
Urobilinogen, UA: 0.2 mg/dL (ref 0.0–1.0)
pH: 5.5 (ref 5.0–8.0)

## 2013-09-15 ENCOUNTER — Ambulatory Visit (INDEPENDENT_AMBULATORY_CARE_PROVIDER_SITE_OTHER): Payer: Medicare HMO | Admitting: *Deleted

## 2013-09-15 DIAGNOSIS — Z23 Encounter for immunization: Secondary | ICD-10-CM

## 2013-10-06 ENCOUNTER — Other Ambulatory Visit: Payer: Self-pay | Admitting: Family Medicine

## 2013-10-09 NOTE — Telephone Encounter (Signed)
Pt is calling because she needs a refill on her atenolol sent to her pharmacy on file. jw

## 2013-11-23 ENCOUNTER — Ambulatory Visit (INDEPENDENT_AMBULATORY_CARE_PROVIDER_SITE_OTHER): Payer: Medicare HMO | Admitting: Interventional Cardiology

## 2013-11-23 ENCOUNTER — Encounter (INDEPENDENT_AMBULATORY_CARE_PROVIDER_SITE_OTHER): Payer: Self-pay

## 2013-11-23 ENCOUNTER — Encounter: Payer: Self-pay | Admitting: Interventional Cardiology

## 2013-11-23 VITALS — BP 122/70 | HR 63 | Ht 64.0 in | Wt 177.8 lb

## 2013-11-23 DIAGNOSIS — I4949 Other premature depolarization: Secondary | ICD-10-CM

## 2013-11-23 DIAGNOSIS — E785 Hyperlipidemia, unspecified: Secondary | ICD-10-CM

## 2013-11-23 DIAGNOSIS — Z862 Personal history of diseases of the blood and blood-forming organs and certain disorders involving the immune mechanism: Secondary | ICD-10-CM

## 2013-11-23 DIAGNOSIS — Z8639 Personal history of other endocrine, nutritional and metabolic disease: Secondary | ICD-10-CM

## 2013-11-23 DIAGNOSIS — R002 Palpitations: Secondary | ICD-10-CM

## 2013-11-23 MED ORDER — ATENOLOL 25 MG PO TABS: 25.0000 mg | ORAL_TABLET | Freq: Every day | ORAL | Status: DC

## 2013-11-23 NOTE — Progress Notes (Signed)
Patient ID: [name], female   DOB: Jul 22, 1946, 68 y.o.   MRN: [id number]   Date: 11/23/2013 ID: [name], DOB 06/28/1946, MRN [id number] PCP: MCDIARMID,TODD D, MD  Reason: History of PVCs causing palpitations  ASSESSMENT;  1. Palpitations, with prior history of PVCs documented remotely 2. Hyperlipidemia with inability to tolerate statin therapy 3. Hypertension 4. History of hypokalemia  PLAN:  1. 48 hour Holter monitor   SUBJECTIVE: [name] is a 68 y.o. female who is here for cardiac evaluation due to 282-3 week history of palpitations that she feel as isolated flutters. They are more noticeable when she tries to sleep at night. It feels similar to PVCs that she was having 20 years ago when Dr. Elsie Lincoln made the diagnosis. She denies any chest pain. There is no significant change in exertional tolerance. No orthopnea, PND, excessive exertional dyspnea, neurological complaints, or sustained tachycardia.   Allergies  Allergen Reactions  . Cefuroxime Rash  . Azithromycin Itching  . Contrast Media [Iodinated Diagnostic Agents] Itching  . Oxycodone Itching  . Phenergan [Promethazine Hcl] Nausea And Vomiting    Could retry if other antiemetics fail  . Statins Other (See Comments)    Myalgias  . Amoxicillin Rash    headache    Current Outpatient Prescriptions on File Prior to Visit  Medication Sig Dispense Refill  . albuterol (VENTOLIN HFA) 108 (90 BASE) MCG/ACT inhaler Inhale 2 puffs into the lungs every 6 (six) hours as needed.  8.5 g  2  . aspirin 325 MG tablet Take 325 mg by mouth daily.      Marland Kitchen atenolol (TENORMIN) 25 MG tablet TAKE 1 TABLET (25 MG TOTAL) BY MOUTH DAILY.  90 tablet  0  . CALCIUM PO Take 1 tablet by mouth daily.      . Multiple Vitamin (MULTIVITAMIN WITH MINERALS) TABS tablet Take 1 tablet by mouth daily.      . Omega-3 Fatty Acids (FISH OIL PO) Take 1 capsule by mouth daily.      . pantoprazole (PROTONIX) 40 MG tablet Take 40 mg by mouth daily as  needed (for acis reflux).      . potassium chloride SA (K-DUR,KLOR-CON) 20 MEQ tablet Take 20 mEq by mouth 2 (two) times daily.        No current facility-administered medications on file prior to visit.    Past Medical History  Diagnosis Date  . Anemia   . Irregular heart beat   . 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 History of pneumonia 05/17/2011    Past Surgical History  Procedure Laterality Date  . Abdominal hysterectomy  1988    partial  . Hernia repair      umbilical, repaired twice.   . Eye surgery      terygium  . Breast surgery      extraction  . Appendectomy    . Cardiac catheterization  12/2006    Dr Lacretia Nicks. Elsie Lincoln. No obstruction.  Normal LV  . 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 No polyps or masses.   . Trigger finger release      Release of stenosising tenosynovitis of thumbs  . Carpal tunnel release      bilateral wrists.   . Gum surgery    . Tonsillectomy    . Tubal ligation  1975  . Dilation and curettage of uterus  1988  . Biopsy breast  09/2001  .  Ultrasound of gall bladder with cck-injection  1999    normal  . Ugi barium  1999    normal    History   Social History  . Marital Status: Married    Spouse Name: Onalee HuaDavid    Number of Children: 3  . Years of Education: 12   Occupational History  . retired [employer]    EKG Technician   Social History Main Topics  . Smoking status: Never Smoker   . Smokeless tobacco: Never Used  . Alcohol Use: 0.5 oz/week    1 drink(s) per week  . Drug Use: No  . Sexual Activity: Not on file   Other Topics Concern  . Not on file   Social History Narrative   Lives with her husband, Cheral AlmasDavid Odum   3 grown children   5 brothers and 3 sisters   Occupation: retired, previously an Manufacturing engineerCG technician at [employer]   No pets   Education: college   Heterosexual   Owns a car   Patient's Cell phone [phone]   Husband's cell phone  [phone]   Renting home   Never Smoked   Drug use-no   Alcohol use: drinks infrequently   Exercise: No regular exercise routine   Always wears seatbelts   No hx of STD   Sun exposure: rarely   Religion: Jehovah's Witness - NO BLOOD PRODUCTS                                              Family History  Problem Relation Age of Onset  . Cancer Mother     Breast  . Hearing loss Mother   . Heart disease Father   . Stroke Father   . Stroke Brother     ROS: Fish she is getting older and has less energy. No episodes of syncope. Denies extremity swelling.. Other systems negative for complaints.  OBJECTIVE: BP 122/70  Pulse 63  Ht 5\' 4"  (1.626 m)  Wt 177 lb 12.8 oz (80.65 kg)  BMI 30.50 kg/m2,  General: No acute distress, obese HEENT: normal no jaundice or pallor Neck: JVD flat. Carotids 2+ and symmetric Chest: Clear Cardiac: Murmur: No murmur or rub. Gallop: Absent. Rhythm: Normal. Other: Normal Abdomen: Bruit: Absent. Pulsation: Absent Extremities: Edema: Absent. Pulses: 2+ in bilateral Neuro: Normal Psych: Anxious  ECG: Normal

## 2013-11-23 NOTE — Patient Instructions (Signed)
Your physician recommends that you continue on your current medications as directed. Please refer to the Current Medication list given to you today.  Your physician has recommended that you wear a holter monitor. Holter monitors are medical devices that record the heart's electrical activity. Doctors most often use these monitors to diagnose arrhythmias. Arrhythmias are problems with the speed or rhythm of the heartbeat. The monitor is a small, portable device. You can wear one while you do your normal daily activities. This is usually used to diagnose what is causing palpitations/syncope (passing out).  Your physician recommends that you schedule a follow-up appointment pending results

## 2013-12-08 ENCOUNTER — Encounter: Payer: Self-pay | Admitting: *Deleted

## 2013-12-08 ENCOUNTER — Encounter (INDEPENDENT_AMBULATORY_CARE_PROVIDER_SITE_OTHER): Payer: Medicare HMO

## 2013-12-08 DIAGNOSIS — R002 Palpitations: Secondary | ICD-10-CM

## 2013-12-08 NOTE — Progress Notes (Signed)
Patient ID: Flora LippsJulia A Weier, female   DOB: 03-Feb-1946, 68 y.o.   MRN: [id number] E-Cardio 48 hour holter monitor applied to patient.

## 2013-12-29 ENCOUNTER — Telehealth: Payer: Self-pay

## 2013-12-29 NOTE — Telephone Encounter (Signed)
pt given holter monitor results.normal benign study.no action needed.pt verbalized understanding.

## 2014-01-07 ENCOUNTER — Ambulatory Visit: Payer: Medicare HMO | Admitting: Family Medicine

## 2014-01-07 ENCOUNTER — Other Ambulatory Visit: Payer: Self-pay | Admitting: Family Medicine

## 2014-02-18 ENCOUNTER — Other Ambulatory Visit: Payer: Self-pay | Admitting: Family Medicine

## 2014-02-18 DIAGNOSIS — Z0000 Encounter for general adult medical examination without abnormal findings: Secondary | ICD-10-CM

## 2014-04-23 ENCOUNTER — Encounter: Payer: Self-pay | Admitting: Family Medicine

## 2014-04-23 NOTE — Progress Notes (Signed)
Pt came by stating that she needs referral to [facility] with Dr. Thomasena Edisollins phone [phone], pt can be reached at [phone].

## 2014-04-23 NOTE — Progress Notes (Signed)
Informed patient that she hasn't been seen in our clinic in almost 2 years.  She made an appt to see Dr. Gayla DossJoyner first.  Already has an appt on 05-04-14 with ortho but [facility] requires referral.  Burnard HawthorneJazmin Nichole Spencer,CMA

## 2014-04-29 ENCOUNTER — Ambulatory Visit (INDEPENDENT_AMBULATORY_CARE_PROVIDER_SITE_OTHER): Payer: Commercial Managed Care - HMO | Admitting: Family Medicine

## 2014-04-29 VITALS — Ht 64.0 in | Wt 176.0 lb

## 2014-04-29 DIAGNOSIS — M25561 Pain in right knee: Secondary | ICD-10-CM | POA: Insufficient documentation

## 2014-04-29 DIAGNOSIS — M25569 Pain in unspecified knee: Secondary | ICD-10-CM

## 2014-04-29 HISTORY — DX: Pain in right knee: M25.561

## 2014-04-29 MED ORDER — MELOXICAM 7.5 MG PO TABS: 7.5000 mg | ORAL_TABLET | Freq: Every day | ORAL | Status: DC

## 2014-04-29 NOTE — Patient Instructions (Signed)
It was great seeing you today.   1. I'm going to get some xrays of your knee. Please go to the hospital and have these taken before you come back to see me. 2. Take Mobic every morning for 7-10 days 3. Do knee exercises 3-4 time a week; See below   Next Appointment  Please make an appointment with Dr Gayla DossJoyner in 10-14 days   I look forward to talking with you again at our next visit. If you have any questions or concerns before then, please call the clinic at [phone].  Take Care,   Dr Wenda LowJames Dannon Perlow  Knee Exercises EXERCISES   STRENGTHENING EXERCISES These exercises may help you when beginning to rehabilitate your injury. They may resolve your symptoms with or without further involvement from your physician, physical therapist, or athletic trainer. While completing these exercises, remember:   Muscles can gain both the endurance and the strength needed for everyday activities through controlled exercises.  Complete these exercises as instructed by your physician, physical therapist, or athletic trainer. Progress the resistance and repetitions only as guided.  You may experience muscle soreness or fatigue, but the pain or discomfort you are trying to eliminate should never worsen during these exercises. If this pain does worsen, stop and make certain you are following the directions exactly. If the pain is still present after adjustments, discontinue the exercise until you can discuss the trouble with your clinician. STRENGTH - Quadriceps, Isometrics  Lie on your back with your right / left leg extended and your opposite knee bent.  Gradually tense the muscles in the front of your right / left thigh. You should see either your knee cap slide up toward your hip or increased dimpling just above the knee. This motion will push the back of the knee down toward the floor/mat/bed on which you are lying.  Hold the muscle as tight as you can without increasing your pain for __________  seconds.  Relax the muscles slowly and completely in between each repetition. Repeat __________ times. Complete this exercise __________ times per day.   STRENGTH - Quadriceps, Straight Leg Raises  Quality counts! Watch for signs that the quadriceps muscle is working to insure you are strengthening the correct muscles and not "cheating" by substituting with healthier muscles.  Lay on your back with your right / left leg extended and your opposite knee bent.  Tense the muscles in the front of your right / left thigh. You should see either your knee cap slide up or increased dimpling just above the knee. Your thigh may even quiver.  Tighten these muscles even more and raise your leg 4 to 6 inches off the floor. Hold for __________ seconds.  Keeping these muscles tense, lower your leg.  Relax the muscles slowly and completely in between each repetition. Repeat __________ times. Complete this exercise __________ times per day.   STRENGTH - Hamstring, Curls  Lay on your stomach with your legs extended. (If you lay on a bed, your feet may hang over the edge.)  Tighten the muscles in the back of your thigh to bend your right / left knee up to 90 degrees. Keep your hips flat on the bed/floor.  Hold this position for __________ seconds.  Slowly lower your leg back to the starting position. Repeat __________ times. Complete this exercise __________ times per day.

## 2014-04-29 NOTE — Progress Notes (Signed)
   Subjective:    Patient ID: [name], female    DOB: 09-16-1946, 68 y.o.   MRN: [id number]  Seen for Same day visit for   CC: Right knee Pain  Ms [name] reports falling and landing on her right knee approximately 3 months ago.  She denies any immediate pain or swelling, but since then has noticed persistent right knee pain associated with swelling of the last couple weeks.  She reports a history of knee osteoarthritis, but denies any prior knee surgeries or steroid injections.  She reports her right knee waxes and wanes and currently is a 2/10 (at worse 8/10).  He is mostly located on the medial side of her knee.  Worse with long periods of sitting, activity and first thing in the morning.  She has been using topical creams with minimal relief.  She denies any locking or catching but feels like her knee gives out occasionally (however has not caused any additional falls.  She reports having to use crutches last week due to right knee pain and weakness that is improved today.   ROS: Denies fevers, erythema, or history of gout  Objective:  Ht 5\' 4"  (1.626 m)  Wt 176 lb (79.833 kg)  BMI 30.20 kg/m2  General: NAD Knee: Inspection mild swelling without erythema or obvious bony abnormalities. Palpation: no warmth:  Medial and lateral tenderness with medial joint line tenderness; NO patellar tenderness, or condyle tenderness. ROM: Limited flexion ~ 110 degress; full extension  Ligaments with solid consistent endpoints including ACL, PCL, LCL, MCL. Positive Mcmurray's Non painful patellar compression. Patellar glide with crepitus. Patellar and quadriceps tendons unremarkable. Hamstring and quadriceps strength is normal.     Assessment & Plan:  See Problem List Documentation

## 2014-05-03 ENCOUNTER — Ambulatory Visit (HOSPITAL_COMMUNITY)
Admission: RE | Admit: 2014-05-03 | Discharge: 2014-05-03 | Disposition: A | Discharge status: Home / Self Care | Payer: Medicare HMO | Source: Ambulatory Visit | Attending: Family Medicine | Admitting: Family Medicine

## 2014-05-03 DIAGNOSIS — M25561 Pain in right knee: Secondary | ICD-10-CM

## 2014-05-03 DIAGNOSIS — M25569 Pain in unspecified knee: Secondary | ICD-10-CM | POA: Diagnosis not present

## 2014-05-10 ENCOUNTER — Ambulatory Visit (INDEPENDENT_AMBULATORY_CARE_PROVIDER_SITE_OTHER): Payer: Commercial Managed Care - HMO | Admitting: Family Medicine

## 2014-05-10 ENCOUNTER — Encounter: Payer: Self-pay | Admitting: Family Medicine

## 2014-05-10 VITALS — BP 126/72 | HR 75 | Temp 98.7°F | Wt 175.0 lb

## 2014-05-10 DIAGNOSIS — M25569 Pain in unspecified knee: Secondary | ICD-10-CM

## 2014-05-10 DIAGNOSIS — M25561 Pain in right knee: Secondary | ICD-10-CM

## 2014-05-10 NOTE — Patient Instructions (Signed)
It was great seeing you today.   1. Try Tylenol Arthritis for your knee pain and continue your knee exercises 3-4 times a week   Please bring all your medications to every doctors visit  Sign up for My Chart to have easy access to your labs results, and communication with your Primary care physician.  Next Appointment  Please make an appointment with Dr Gayla Doss in 2-3 month for wellness visit   I look forward to talking with you again at our next visit. If you have any questions or concerns before then, please call the clinic at [phone].  Take Care,   Dr Wenda Low

## 2014-05-10 NOTE — Progress Notes (Signed)
  Patient name: [name] MRN [id number]  Date of birth: [birth year]  CC & HPI:  [name] is a 68 y.o. female presenting today for f/u of right knee pain. She did not take Mobic due to fear of side-effects on bottle - increased risk of MI and CVA. Knee pain has improved. Not using crutches at all. Pain 1/10 today. Using topical creams daily and reports doing knee exercises several time. She has considered steroid injections and knee replacement but not interested at this time.   ROS: Denies fevers, chill, or swelling   Objective Findings:  Vitals: BP 126/72  Pulse 75  Temp(Src) 98.7 F (37.1 C) (Oral)  Wt 175 lb (79.379 kg)  Gen: NAD Right Knee: Normal to inspection with no erythema or effusion or obvious bony abnormalities. Palpation normal with no warmth, joint line tenderness, patellar tenderness, or condyle tenderness. ROM full in flexion and extension Ligaments with solid consistent endpoints including ACL, PCL, LCL, MCL. Patellar glide without crepitus. Patellar and quadriceps tendons unremarkable. Hamstring and quadriceps strength is normal.   Assessment & Plan:   Please See Problem Focused Assessment & Plan

## 2014-05-10 NOTE — Assessment & Plan Note (Signed)
Xrays reviewed: Mild DJD - Knee pain improved - Discussed future possibility of Steroid injection and knee replacement  - Advised Tylenol Arthritis as needed and continuing knee exercises

## 2014-05-13 ENCOUNTER — Ambulatory Visit (HOSPITAL_COMMUNITY)
Admission: RE | Admit: 2014-05-13 | Discharge: 2014-05-13 | Disposition: A | Discharge status: Home / Self Care | Payer: Medicare HMO | Source: Ambulatory Visit | Attending: Family Medicine | Admitting: Family Medicine

## 2014-05-13 DIAGNOSIS — Z1231 Encounter for screening mammogram for malignant neoplasm of breast: Secondary | ICD-10-CM | POA: Diagnosis not present

## 2014-05-13 DIAGNOSIS — Z0000 Encounter for general adult medical examination without abnormal findings: Secondary | ICD-10-CM

## 2014-07-14 ENCOUNTER — Other Ambulatory Visit: Payer: Self-pay | Admitting: Family Medicine

## 2014-07-19 ENCOUNTER — Encounter: Payer: Self-pay | Admitting: Family Medicine

## 2014-09-24 ENCOUNTER — Encounter: Payer: Self-pay | Admitting: Interventional Cardiology

## 2014-09-24 ENCOUNTER — Ambulatory Visit (INDEPENDENT_AMBULATORY_CARE_PROVIDER_SITE_OTHER): Payer: Medicare Other | Admitting: Interventional Cardiology

## 2014-09-24 VITALS — BP 110/80 | HR 60 | Ht 64.0 in | Wt 170.6 lb

## 2014-09-24 DIAGNOSIS — E785 Hyperlipidemia, unspecified: Secondary | ICD-10-CM | POA: Diagnosis not present

## 2014-09-24 DIAGNOSIS — E782 Mixed hyperlipidemia: Secondary | ICD-10-CM | POA: Insufficient documentation

## 2014-09-24 DIAGNOSIS — I493 Ventricular premature depolarization: Secondary | ICD-10-CM | POA: Diagnosis not present

## 2014-09-24 DIAGNOSIS — E668 Other obesity: Secondary | ICD-10-CM | POA: Diagnosis not present

## 2014-09-24 DIAGNOSIS — Z6829 Body mass index (BMI) 29.0-29.9, adult: Secondary | ICD-10-CM

## 2014-09-24 HISTORY — DX: Hyperlipidemia, unspecified: E78.5

## 2014-09-24 LAB — LIPID PANEL
Cholesterol: 316 mg/dL — ABNORMAL HIGH (ref 0–200)
HDL: 56.5 mg/dL (ref >39.00)
LDL Cholesterol: 247 mg/dL — ABNORMAL HIGH (ref 0–99)
NonHDL: 259.5
Total CHOL/HDL Ratio: 6
Triglycerides: 61 mg/dL (ref 0.0–149.0)
VLDL: 12.2 mg/dL (ref 0.0–40.0)

## 2014-09-24 LAB — HEMOGLOBIN A1C: Hgb A1c MFr Bld: 5.1 % (ref 4.6–6.5)

## 2014-09-24 NOTE — Patient Instructions (Signed)
Your physician recommends that you continue on your current medications as directed. Please refer to the Current Medication list given to you today.  Lab Today: Lipid, HgA1c  Your physician wants you to follow-up in: 1 year with Dr.Smith You will receive a reminder letter in the mail two months in advance. If you don't receive a letter, please call our office to schedule the follow-up appointment.

## 2014-09-24 NOTE — Progress Notes (Signed)
Patient ID: [name], female   DOB: October 14, 1945, 69 y.o.   MRN: [id number]    [address] [place], [place]  [address] Phone: [phone] Fax:  [phone]  Date:  09/24/2014   ID:  [name], DOB 12-02-1945, MRN [id number]  PCP:  MCDIARMID,TODD D, MD   ASSESSMENT:  1. Left jaw discomfort, waxing and waning for the last 4 days. Now resolved. 2. Hyperlipidemia, not on therapy 3. Family history of diabetes 4. Obesity   PLAN:  1. Hemoglobin A1c 2. Lipid panel 3. Clinical observation of left jaw discomfort. I do not believe this is cardiac related. 4. Clinical follow-up in one year   SUBJECTIVE: [name] is a 69 y.o. female who is concerned that she has had recurring left jaw discomfort over the last 4 days. She has not had any yet today. For 3 days the discomfort was waxing and waning several times per day. It will last up to 3-4 minutes. DOB occasional nausea. No associated dyspnea or radiation to the orbit chest. No palpitations or tachycardia. She denied orthopnea, PND, lower extremity swelling, and syncope. No change in exertional tolerance.  She is worried she may have diabetes. She also wants to know her lipid levels.  Wt Readings from Last 3 Encounters:  09/24/14 170 lb 9.6 oz (77.384 kg)  05/10/14 175 lb (79.379 kg)  04/29/14 176 lb (79.833 kg)     Past Medical History  Diagnosis Date  . Anemia   . Irregular heart beat   . 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 History of pneumonia 05/17/2011    Current Outpatient Prescriptions  Medication Sig Dispense Refill  . albuterol (VENTOLIN HFA) 108 (90 BASE) MCG/ACT inhaler Inhale 2 puffs into the lungs every 6 (six) hours as needed. 8.5 g 2  . aspirin 325 MG tablet Take 325 mg by mouth daily.    Marland Kitchen atenolol (TENORMIN) 25 MG tablet Take 1 tablet (25 mg total) by mouth daily. 90 tablet 3  . atenolol (TENORMIN) 25 MG tablet TAKE 1 TABLET (25 MG TOTAL) BY MOUTH DAILY. 90 tablet 3  . CALCIUM PO  Take 1 tablet by mouth daily.    Marland Kitchen KLOR-CON M20 20 MEQ tablet TAKE 2 TABLETS BY MOUTH EVERY MORNING AND 2 TABLETS BY MOUTH EVERY EVENING 360 tablet 0  . meloxicam (MOBIC) 7.5 MG tablet Take 1 tablet (7.5 mg total) by mouth daily. 10 tablet 0  . Multiple Vitamin (MULTIVITAMIN WITH MINERALS) TABS tablet Take 1 tablet by mouth daily.    . Omega-3 Fatty Acids (FISH OIL PO) Take 1 capsule by mouth daily.    . pantoprazole (PROTONIX) 40 MG tablet Take 40 mg by mouth daily as needed (for acis reflux).     No current facility-administered medications for this visit.    Allergies:    Allergies  Allergen Reactions  . Cefuroxime Rash  . Azithromycin Itching  . Contrast Media [Iodinated Diagnostic Agents] Itching  . Oxycodone Itching  . Phenergan [Promethazine Hcl] Nausea And Vomiting    Could retry if other antiemetics fail  . Statins Other (See Comments)    Myalgias  . Amoxicillin Rash    headache    Social History:  The patient  reports that she has never smoked. She has never used smokeless tobacco. She reports that she drinks about 0.5 oz of alcohol per week. She reports that she does not use illicit drugs.   ROS:  Please see the history of present  illness.   Some anxiety. Not sleeping well. No transient neurological symptoms. Appetite is been stable.   All other systems reviewed and negative.   OBJECTIVE: VS:  BP 110/80 mmHg  Pulse 60  Ht 5\' 4"  (1.626 m)  Wt 170 lb 9.6 oz (77.384 kg)  BMI 29.27 kg/m2 Well nourished, well developed, in no acute distress, obese  HEENT: normal Neck: JVD flat. Carotid bruit absent  Cardiac:  normal S1, S2; RRR; no murmur Lungs:  clear to auscultation bilaterally, no wheezing, rhonchi or rales Abd: soft, nontender, no hepatomegaly Ext: Edema absent. Pulses 2+  Skin: warm and dry Neuro:  CNs 2-12 intact, no focal abnormalities noted  EKG:  Normal sinus rhythm with poor R-wave progression.       Signed, Darci NeedleHenry W. B. Smith III, MD 09/24/2014 10:02  AM

## 2014-09-29 ENCOUNTER — Telehealth: Payer: Self-pay

## 2014-09-29 NOTE — Telephone Encounter (Signed)
Called to give pt lab results.lmtcb  

## 2014-09-29 NOTE — Telephone Encounter (Signed)
-----   Message from Lesleigh NoeHenry W Smith III, MD sent at 09/28/2014  6:09 PM EST ----- No evidence of DM. HgA1C is normal. Cholesterol is higher than ever. Diet, exercise, and try to lose weight. Other option is medication. We have not had good tolerance in the past.

## 2014-09-30 NOTE — Telephone Encounter (Signed)
-----   Message from Henry W Smith III, MD sent at 09/28/2014  6:09 PM EST ----- No evidence of DM. HgA1C is normal. Cholesterol is higher than ever. Diet, exercise, and try to lose weight. Other option is medication. We have not had good tolerance in the past. 

## 2014-09-30 NOTE — Telephone Encounter (Signed)
Pt aware of lab results with verbal understanding.No evidence of DM. HgA1C is normal. Cholesterol is higher than ever. Diet, exercise, and try to lose weight. Other option is medication. We have not had good tolerance in the past. Pt verbalized understanding

## 2014-10-13 ENCOUNTER — Telehealth: Payer: Self-pay | Admitting: Interventional Cardiology

## 2014-10-13 NOTE — Telephone Encounter (Signed)
New Message  Pt called to inform Dr. Katrinka BlazingSmith that her Jaw is still hurting. And she is requesting a call back to discuss.

## 2014-10-14 NOTE — Telephone Encounter (Signed)
returned pt call. lmtcb if assiatance still needed.

## 2014-10-14 NOTE — Telephone Encounter (Signed)
Follow up ° ° ° ° °Returned a nurses call °

## 2014-10-14 NOTE — Telephone Encounter (Signed)
Returned pt call. Pt sts that she has hac a reoccurrence of jaw pain, pt denies any other symptoms.Dr.Smith aware. Pt adv that Dr.Smith doesn't think her jaw pain is  cardiac related and she should f/u with her pcp or dentist.pt verbalized understanding.

## 2014-10-28 ENCOUNTER — Ambulatory Visit (INDEPENDENT_AMBULATORY_CARE_PROVIDER_SITE_OTHER): Payer: Medicare Other | Admitting: Family Medicine

## 2014-10-28 ENCOUNTER — Other Ambulatory Visit: Payer: Self-pay | Admitting: Family Medicine

## 2014-10-28 ENCOUNTER — Encounter: Payer: Self-pay | Admitting: Family Medicine

## 2014-10-28 VITALS — BP 136/90 | HR 76 | Temp 97.9°F | Ht 64.0 in | Wt 175.8 lb

## 2014-10-28 DIAGNOSIS — F4323 Adjustment disorder with mixed anxiety and depressed mood: Secondary | ICD-10-CM

## 2014-10-28 DIAGNOSIS — Z23 Encounter for immunization: Secondary | ICD-10-CM

## 2014-10-28 DIAGNOSIS — Z79899 Other long term (current) drug therapy: Secondary | ICD-10-CM | POA: Diagnosis not present

## 2014-10-28 DIAGNOSIS — Z13820 Encounter for screening for osteoporosis: Secondary | ICD-10-CM

## 2014-10-28 DIAGNOSIS — E7800 Pure hypercholesterolemia, unspecified: Secondary | ICD-10-CM

## 2014-10-28 DIAGNOSIS — R51 Headache: Secondary | ICD-10-CM

## 2014-10-28 DIAGNOSIS — R6884 Jaw pain: Secondary | ICD-10-CM | POA: Diagnosis not present

## 2014-10-28 DIAGNOSIS — R519 Headache, unspecified: Secondary | ICD-10-CM

## 2014-10-28 DIAGNOSIS — D638 Anemia in other chronic diseases classified elsewhere: Secondary | ICD-10-CM

## 2014-10-28 DIAGNOSIS — Z780 Asymptomatic menopausal state: Secondary | ICD-10-CM

## 2014-10-28 HISTORY — DX: Jaw pain: R68.84

## 2014-10-28 HISTORY — DX: Adjustment disorder with mixed anxiety and depressed mood: F43.23

## 2014-10-28 LAB — POCT SEDIMENTATION RATE: POCT SED RATE: 23 mm/hr — AB (ref 0–22)

## 2014-10-28 NOTE — Patient Instructions (Signed)
I believe your neck and jaw pain are from muscles in your neck.  They are very tight and have "trigger points" which are tight muscle bundles that can "fire off" with pain that radiates into other parts of the body, like the Jaw.   I am checking some blood work for a rare cause of jaw and headache, a sedimentation rate. We are checking your thyroid level to see if if could be causing your high cholesterol Dr Keena Heesch will call you if your tests are not good. Otherwise he will send you a letter.  If you sign up for MyChart online, you will be able to see your test results once Dr Hasset Chaviano has reviewed them.  If you do not hear from us with in 2 weeks please call our office  Exercise for 30 minutes at least three times a week will help with your mood and sleep.  Get outside for at least thirty minutes most days, this will help your sleep as well.   Let Dr Shalita Notte know if you need a counselor, he would be glad to refer you to someone.

## 2014-10-28 NOTE — Assessment & Plan Note (Addendum)
New problem Uncertain prognosis Working diagnosis is muscular tender points in left lateral cervicalstrap muscles. Patient recently separated from her husband.  Differential includes  Includes GCA, TMJ, mandibular bone cyst, Trigeminal neuralgia.   Plan ESR Cervical exercises Aspirin or APAP as needed.  Moist heat Ice when pain exacerbation.

## 2014-10-28 NOTE — Progress Notes (Signed)
   Subjective:    Patient ID: [name], female    DOB: 1946/08/16, 69 y.o.   MRN: [id number]  HPI  Jaw pain Onset: early January Location: Jaw line on left and left lateral neck Quality: dull pain Severity: little pain most of time, 5/10, does not get worst Function: last few seconds to minutes Duration: ~ month Pattern: off and on, comes in clusters and then skip some days Course: stable Radiation: no  Relief: tried heat Precipitant: none  Associated Symptoms:  Trauma (Acute or Chronic): No trauma to head/face/neck recalled.  Prior Diagnostic Testing or Treatments: Cardiology did not think pain related to heart.  Dr Fredrich BirksStern's office told patient to see her PCP Relevant PMH/PSH: edentulous, left supraorbital morning headaches  every 2-3 days  for last 2 weeks, no history of migraine, not sleeping well, DFA, insomnia has always been a problem.  Left shoulder pain from rotator cuff tear. No clicking nor pain with chewing. No diplopia, no jaw claudictions, no change in vision or hearing, no lancinating pain , no jaw locking. (+) chronic indigestion. No dysphagia nor odynophagia. No jaw pain at night.  No shoulder stiffness.   No smoking  Review of Systems  See HPI     Objective:   Physical Exam VS reviewed Gen: no acute distress,well groomed Heent: Normal pinnas, no pain with pinna manipulation, Clear EACs, Normal TMs, oropharynx without Erythema nor edema No palpable lesions along buccal grooves.  No parotid enlargement nor tenderness.  No drainagle from Stenson's duct.  No tenderness along mandibular bone to palpation.   Neck, supple, tender along lateral cervical strap muscles with several tender points.  No visible vessels along temples..  No palpable temporal arteries.        Assessment & Plan:

## 2014-10-28 NOTE — Assessment & Plan Note (Signed)
New problem Recent separation from husband. Good support system Recommend addition of activity out of house and exercise

## 2014-10-29 ENCOUNTER — Encounter: Payer: Self-pay | Admitting: Family Medicine

## 2014-10-29 LAB — COMPREHENSIVE METABOLIC PANEL
ALT: 10 U/L (ref 0–35)
AST: 18 U/L (ref 0–37)
Albumin: 4 g/dL (ref 3.5–5.2)
Alkaline Phosphatase: 116 U/L (ref 39–117)
BUN: 12 mg/dL (ref 6–23)
CO2: 24 mEq/L (ref 19–32)
Calcium: 9.9 mg/dL (ref 8.4–10.5)
Chloride: 104 mEq/L (ref 96–112)
Creat: 0.71 mg/dL (ref 0.50–1.10)
Glucose, Bld: 85 mg/dL (ref 70–99)
Potassium: 4.7 mEq/L (ref 3.5–5.3)
Sodium: 138 mEq/L (ref 135–145)
Total Bilirubin: 0.5 mg/dL (ref 0.2–1.2)
Total Protein: 7.4 g/dL (ref 6.0–8.3)

## 2014-10-29 LAB — CBC
HCT: 37.6 % (ref 36.0–46.0)
Hemoglobin: 12.3 g/dL (ref 12.0–15.0)
MCH: 30.6 pg (ref 26.0–34.0)
MCHC: 32.7 g/dL (ref 30.0–36.0)
MCV: 93.5 fL (ref 78.0–100.0)
MPV: 9.1 fL (ref 8.6–12.4)
Platelets: 320 10*3/uL (ref 150–400)
RBC: 4.02 MIL/uL (ref 3.87–5.11)
RDW: 13.9 % (ref 11.5–15.5)
WBC: 5.6 10*3/uL (ref 4.0–10.5)

## 2014-10-29 LAB — TSH: TSH: 0.381 u[IU]/mL (ref 0.350–4.500)

## 2014-11-08 ENCOUNTER — Ambulatory Visit
Admission: RE | Admit: 2014-11-08 | Discharge: 2014-11-08 | Disposition: A | Discharge status: Home / Self Care | Payer: Medicare Other | Source: Ambulatory Visit | Attending: Family Medicine | Admitting: Family Medicine

## 2014-11-08 DIAGNOSIS — M8588 Other specified disorders of bone density and structure, other site: Secondary | ICD-10-CM | POA: Diagnosis not present

## 2014-11-08 DIAGNOSIS — Z780 Asymptomatic menopausal state: Secondary | ICD-10-CM | POA: Diagnosis not present

## 2014-11-10 ENCOUNTER — Encounter: Payer: Self-pay | Admitting: Family Medicine

## 2014-11-16 ENCOUNTER — Telehealth: Payer: Self-pay | Admitting: Family Medicine

## 2014-11-16 NOTE — Telephone Encounter (Signed)
Advised against taking something made with calamari.

## 2014-11-16 NOTE — Telephone Encounter (Signed)
She is trying to get cholestrol done Is taking fish oil.  Wants to know if she can take something from calamari since she is allergic to shellfish

## 2014-11-16 NOTE — Telephone Encounter (Signed)
Will forward to MD to advise. Jazmin Hartsell,CMA  

## 2014-11-24 LAB — HM DIABETES FOOT EXAM: HM Diabetic Foot Exam: NORMAL

## 2014-11-25 LAB — FECAL OCCULT BLOOD, GUAIAC: Fecal Occult Blood: NEGATIVE

## 2014-12-13 ENCOUNTER — Encounter (HOSPITAL_COMMUNITY): Payer: Self-pay | Admitting: *Deleted

## 2014-12-13 DIAGNOSIS — Z8719 Personal history of other diseases of the digestive system: Secondary | ICD-10-CM | POA: Diagnosis not present

## 2014-12-13 DIAGNOSIS — Z880 Allergy status to penicillin: Secondary | ICD-10-CM | POA: Diagnosis not present

## 2014-12-13 DIAGNOSIS — Z7982 Long term (current) use of aspirin: Secondary | ICD-10-CM | POA: Diagnosis not present

## 2014-12-13 DIAGNOSIS — Z79899 Other long term (current) drug therapy: Secondary | ICD-10-CM | POA: Insufficient documentation

## 2014-12-13 DIAGNOSIS — Z862 Personal history of diseases of the blood and blood-forming organs and certain disorders involving the immune mechanism: Secondary | ICD-10-CM | POA: Diagnosis not present

## 2014-12-13 DIAGNOSIS — J984 Other disorders of lung: Secondary | ICD-10-CM | POA: Diagnosis not present

## 2014-12-13 DIAGNOSIS — Z8679 Personal history of other diseases of the circulatory system: Secondary | ICD-10-CM | POA: Diagnosis not present

## 2014-12-13 DIAGNOSIS — M62830 Muscle spasm of back: Secondary | ICD-10-CM | POA: Diagnosis not present

## 2014-12-13 DIAGNOSIS — Z8701 Personal history of pneumonia (recurrent): Secondary | ICD-10-CM | POA: Diagnosis not present

## 2014-12-13 DIAGNOSIS — R109 Unspecified abdominal pain: Secondary | ICD-10-CM | POA: Diagnosis present

## 2014-12-13 LAB — CBC WITH DIFFERENTIAL/PLATELET
Basophils Absolute: 0 10*3/uL (ref 0.0–0.1)
Basophils Relative: 0 % (ref 0–1)
Eosinophils Absolute: 0.2 10*3/uL (ref 0.0–0.7)
Eosinophils Relative: 4 % (ref 0–5)
HCT: 33.1 % — ABNORMAL LOW (ref 36.0–46.0)
Hemoglobin: 11.3 g/dL — ABNORMAL LOW (ref 12.0–15.0)
Lymphocytes Relative: 35 % (ref 12–46)
Lymphs Abs: 1.7 10*3/uL (ref 0.7–4.0)
MCH: 31 pg (ref 26.0–34.0)
MCHC: 34.1 g/dL (ref 30.0–36.0)
MCV: 90.9 fL (ref 78.0–100.0)
Monocytes Absolute: 0.4 10*3/uL (ref 0.1–1.0)
Monocytes Relative: 8 % (ref 3–12)
Neutro Abs: 2.6 10*3/uL (ref 1.7–7.7)
Neutrophils Relative %: 53 % (ref 43–77)
Platelets: 349 10*3/uL (ref 150–400)
RBC: 3.64 MIL/uL — ABNORMAL LOW (ref 3.87–5.11)
RDW: 13.2 % (ref 11.5–15.5)
WBC: 4.9 10*3/uL (ref 4.0–10.5)

## 2014-12-13 LAB — COMPREHENSIVE METABOLIC PANEL
ALT: 13 U/L (ref 0–35)
AST: 19 U/L (ref 0–37)
Albumin: 3.6 g/dL (ref 3.5–5.2)
Alkaline Phosphatase: 105 U/L (ref 39–117)
Anion gap: 5 (ref 5–15)
BUN: 7 mg/dL (ref 6–23)
CO2: 29 mmol/L (ref 19–32)
Calcium: 9.4 mg/dL (ref 8.4–10.5)
Chloride: 104 mmol/L (ref 96–112)
Creatinine, Ser: 0.71 mg/dL (ref 0.50–1.10)
GFR calc Af Amer: >90 mL/min (ref >90)
GFR calc non Af Amer: 86 mL/min — ABNORMAL LOW (ref >90)
Glucose, Bld: 92 mg/dL (ref 70–99)
Potassium: 4.2 mmol/L (ref 3.5–5.1)
Sodium: 138 mmol/L (ref 135–145)
Total Bilirubin: 0.6 mg/dL (ref 0.3–1.2)
Total Protein: 7.2 g/dL (ref 6.0–8.3)

## 2014-12-13 NOTE — ED Notes (Signed)
Pt in c/o pain to her left posterior flank, pain is worse there with movement or taking a deep breath, denies pain with urination, no distress noted, unsure of injury

## 2014-12-14 ENCOUNTER — Emergency Department (HOSPITAL_COMMUNITY): Payer: Medicare Other

## 2014-12-14 ENCOUNTER — Emergency Department (HOSPITAL_COMMUNITY)
Admission: EM | Admit: 2014-12-14 | Discharge: 2014-12-14 | Disposition: A | Discharge status: Home / Self Care | Payer: Medicare Other | Attending: Emergency Medicine | Admitting: Emergency Medicine

## 2014-12-14 DIAGNOSIS — M62830 Muscle spasm of back: Secondary | ICD-10-CM

## 2014-12-14 DIAGNOSIS — M8580 Other specified disorders of bone density and structure, unspecified site: Secondary | ICD-10-CM

## 2014-12-14 DIAGNOSIS — J984 Other disorders of lung: Secondary | ICD-10-CM | POA: Diagnosis not present

## 2014-12-14 HISTORY — DX: Other specified disorders of bone density and structure, unspecified site: M85.80

## 2014-12-14 LAB — URINALYSIS, ROUTINE W REFLEX MICROSCOPIC
Bilirubin Urine: NEGATIVE
Glucose, UA: NEGATIVE mg/dL
Hgb urine dipstick: NEGATIVE
Ketones, ur: NEGATIVE mg/dL
Leukocytes, UA: NEGATIVE
Nitrite: NEGATIVE
Protein, ur: NEGATIVE mg/dL
Specific Gravity, Urine: 1.012 (ref 1.005–1.030)
Urobilinogen, UA: 0.2 mg/dL (ref 0.0–1.0)
pH: 5 (ref 5.0–8.0)

## 2014-12-14 LAB — TROPONIN I: Troponin I: <0.03 ng/mL (ref <0.031)

## 2014-12-14 LAB — CK: Total CK: 77 U/L (ref 7–177)

## 2014-12-14 MED ORDER — DIAZEPAM 2 MG PO TABS
2.0000 mg | ORAL_TABLET | Freq: Once | ORAL | Status: AC
  Administered 2014-12-14: 2 mg via ORAL
  Filled 2014-12-14: qty 1

## 2014-12-14 MED ORDER — IBUPROFEN 400 MG PO TABS: 400.0000 mg | ORAL_TABLET | Freq: Four times a day (QID) | ORAL | Status: DC | PRN

## 2014-12-14 MED ORDER — DIAZEPAM 5 MG PO TABS: 5.0000 mg | ORAL_TABLET | Freq: Two times a day (BID) | ORAL | Status: DC

## 2014-12-14 MED ORDER — IBUPROFEN 400 MG PO TABS
600.0000 mg | ORAL_TABLET | Freq: Once | ORAL | Status: AC
  Administered 2014-12-14: 600 mg via ORAL
  Filled 2014-12-14 (×2): qty 1

## 2014-12-14 NOTE — Discharge Instructions (Signed)
It appears that you have muscle spasms. Valium is a strong muscle relaxant, take it as prescribed - but please read about the medicine below, and it is makes you to sleept, DO NOT TAKE THE MEDICINE.  Also, make sure you walk carefully with valium, as you might fall otherwise. ALSO, HEAT THE AREA AND MASSAGE IT FOR BETTER RELIEF.  Diazepam tablets What is this medicine? DIAZEPAM (dye AZ e pam) is a benzodiazepine. It is used to treat anxiety and nervousness. It also can help treat alcohol withdrawal, relax muscles, and treat certain types of seizures. This medicine may be used for other purposes; ask your health care provider or pharmacist if you have questions. COMMON BRAND NAME(S): Valium What should I tell my health care provider before I take this medicine? They need to know if you have any of these conditions -an alcohol or drug abuse problem -bipolar disorder, depression, psychosis or other mental health condition -glaucoma -kidney or liver disease -lung or breathing disease -myasthenia gravis -Parkinson's disease -seizures or a history of seizures -suicidal thoughts -an unusual or allergic reaction to diazepam, other benzodiazepines, foods, dyes, or preservatives -pregnant or trying to get pregnant -breast-feeding How should I use this medicine? Take this medicine by mouth with a glass of water. Follow the directions on the prescription label. If this medicine upsets your stomach, take it with food or milk. Take your doses at regular intervals. Do not take your medicine more often than directed. If you have been taking this medicine regularly for some time, do not suddenly stop taking it. You must gradually reduce the dose or you may get severe side effects. Ask your doctor or health care professional for advice. Even after you stop taking this medicine it can still affect your body for several days. Talk to your pediatrician regarding the use of this medicine in children. Special care  may be needed. Overdosage: If you think you have taken too much of this medicine contact a poison control center or emergency room at once. NOTE: This medicine is only for you. Do not share this medicine with others. What if I miss a dose? If you miss a dose, take it as soon as you can. If it is almost time for your next dose, take only that dose. Do not take double or extra doses. What may interact with this medicine? -cimetidine -grapefruit juice -herbal or dietary supplements like kava kava, melatonin, St. John's Wort, or valerian -medicines for anxiety or sleeping problems, like alprazolam, lorazepam, or triazolam -medicines for depression, mental problems or psychiatric disturbances -medicines for HIV infection or AIDS -prescription pain medicines -rifampin, rifapentine, or rifabutin -some medicines for seizures like carbamazepine, phenobarbital, phenytoin, or primidone This list may not describe all possible interactions. Give your health care provider a list of all the medicines, herbs, non-prescription drugs, or dietary supplements you use. Also tell them if you smoke, drink alcohol, or use illegal drugs. Some items may interact with your medicine. What should I watch for while using this medicine? Visit your doctor or health care professional for regular checks on your progress. Your body can become dependent on this medicine. Ask your doctor or health care professional if you still need to take it. You may get drowsy or dizzy. Do not drive, use machinery, or do anything that needs mental alertness until you know how this medicine affects you. To reduce the risk of dizzy and fainting spells, do not stand or sit up quickly, especially if you are an  older patient. Alcohol may increase dizziness and drowsiness. Avoid alcoholic drinks. Do not treat yourself for coughs, colds or allergies without asking your doctor or health care professional for advice. Some ingredients can increase possible  side effects. What side effects may I notice from receiving this medicine? Side effects that you should report to your doctor or health care professional as soon as possible: -allergic reactions like skin rash, itching or hives, swelling of the face, lips, or tongue -angry, confused, depressed, other mood changes -breathing problems -feeling faint or lightheaded, falls -muscle cramps -problems with balance, talking, walking -restlessness -tremors -trouble passing urine or change in the amount of urine -unusually weak or tired Side effects that usually do not require medical attention (report to your doctor or health care professional if they continue or are bothersome): -difficulty sleeping, nightmares -dizziness, drowsiness, clumsiness, or unsteadiness, a hangover effect -headache -nausea, vomiting This list may not describe all possible side effects. Call your doctor for medical advice about side effects. You may report side effects to FDA at 1-800-FDA-1088. Where should I keep my medicine? Keep out of the reach of children. This medicine can be abused. Keep your medicine in a safe place to protect it from theft. Do not share this medicine with anyone. Selling or giving away this medicine is dangerous and against the law. Store at room temperature between 15 and 30 degrees C (59 and 86 degrees F). Protect from light. Keep container tightly closed. Throw away any unused medicine after the expiration date. NOTE: This sheet is a summary. It may not cover all possible information. If you have questions about this medicine, talk to your doctor, pharmacist, or health care provider.  2015, Elsevier/Gold Standard. (2007-12-22 16:57:35)    Muscle Cramps and Spasms Muscle cramps and spasms occur when a muscle or muscles tighten and you have no control over this tightening (involuntary muscle contraction). They are a common problem and can develop in any muscle. The most common place is in the  calf muscles of the leg. Both muscle cramps and muscle spasms are involuntary muscle contractions, but they also have differences:   Muscle cramps are sporadic and painful. They may last a few seconds to a quarter of an hour. Muscle cramps are often more forceful and last longer than muscle spasms.  Muscle spasms may or may not be painful. They may also last just a few seconds or much longer. CAUSES  It is uncommon for cramps or spasms to be due to a serious underlying problem. In many cases, the cause of cramps or spasms is unknown. Some common causes are:   Overexertion.   Overuse from repetitive motions (doing the same thing over and over).   Remaining in a certain position for a long period of time.   Improper preparation, form, or technique while performing a sport or activity.   Dehydration.   Injury.   Side effects of some medicines.   Abnormally low levels of the salts and ions in your blood (electrolytes), especially potassium and calcium. This could happen if you are taking water pills (diuretics) or you are pregnant.  Some underlying medical problems can make it more likely to develop cramps or spasms. These include, but are not limited to:   Diabetes.   Parkinson disease.   Hormone disorders, such as thyroid problems.   Alcohol abuse.   Diseases specific to muscles, joints, and bones.   Blood vessel disease where not enough blood is getting to the muscles.  HOME  CARE INSTRUCTIONS   Stay well hydrated. Drink enough water and fluids to keep your urine clear or pale yellow.  It may be helpful to massage, stretch, and relax the affected muscle.  For tight or tense muscles, use a warm towel, heating pad, or hot shower water directed to the affected area.  If you are sore or have pain after a cramp or spasm, applying ice to the affected area may relieve discomfort.  Put ice in a plastic bag.  Place a towel between your skin and the bag.  Leave the  ice on for 15-20 minutes, 03-04 times a day.  Medicines used to treat a known cause of cramps or spasms may help reduce their frequency or severity. Only take over-the-counter or prescription medicines as directed by your caregiver. SEEK MEDICAL CARE IF:  Your cramps or spasms get more severe, more frequent, or do not improve over time.  MAKE SURE YOU:   Understand these instructions.  Will watch your condition.  Will get help right away if you are not doing well or get worse. Document Released: 02/23/2002 Document Revised: 12/29/2012 Document Reviewed: 08/20/2012 [facility] Patient Information 2015 [place], [place]. This information is not intended to replace advice given to you by your health care provider. Make sure you discuss any questions you have with your health care provider.

## 2014-12-14 NOTE — ED Notes (Signed)
Pt c/o L sided back pain since last night. Reports pain increases on movement or when lifting L arm arm. Denies urinary symptom.

## 2014-12-14 NOTE — ED Notes (Signed)
Pt in xray

## 2014-12-14 NOTE — ED Provider Notes (Signed)
CSN: [id number]     Arrival date & time 12/13/14  2213 History   First MD Initiated Contact with Patient 12/14/14 0213     This chart was scribed for Derwood Kaplan, MD by Arlan Organ, ED Scribe. This patient was seen in room D33C/D33C and the patient's care was started 2:18 AM.   Chief Complaint  Patient presents with  . Flank Pain   The history is provided by the patient. No language interpreter was used.    HPI Comments: [name] is a 69 y.o. female with a PMHx of anemia who presents to the Emergency Department complaining of constant, ongoing, non-radiating L back pain that shoots up her back x 2 days. Discomfort is described as dull/achy. Pain is exacerbated with movement, twisting, deep breathing, and when lifting arms above her head. She has not tried any OTC medications or home remedies to help manage symptoms. No recent trauma or injury to area. No abdominal pain, dysuria, urinary frequency/urgency, CP, or SOB. No history of kidney stones. No history of blood clots. No recent long distance travel. Pt with several known allergies to medications as listed below.  Past Medical History  Diagnosis Date  . Anemia   . Irregular heart beat   . 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 History of pneumonia 05/17/2011   Past Surgical History  Procedure Laterality Date  . Abdominal hysterectomy  1988    partial  . Hernia repair      umbilical, repaired twice.   . Eye surgery      terygium  . Breast surgery      extraction  . Appendectomy    . Cardiac catheterization  12/2006    Dr Lacretia Nicks. Elsie Lincoln. No obstruction.  Normal LV  . 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 No polyps or masses.   . Trigger finger release      Release of stenosising tenosynovitis of thumbs  . Carpal tunnel release      bilateral wrists.   . Gum surgery    . Tonsillectomy    . Tubal ligation  1975  . Dilation and curettage of uterus  1988  .  Biopsy breast  09/2001  . Ultrasound of gall bladder with cck-injection  1999    normal  . Ugi barium  1999    normal   Family History  Problem Relation Age of Onset  . Cancer Mother     Breast  . Hearing loss Mother   . Heart disease Father   . Stroke Father   . Stroke Brother    History  Substance Use Topics  . Smoking status: Never Smoker   . Smokeless tobacco: Never Used  . Alcohol Use: 0.5 oz/week    1 drink(s) per week   OB History    Gravida Para Term Preterm AB TAB SAB Ectopic Multiple Living   3              Review of Systems  Respiratory: Negative for cough and shortness of breath.   Cardiovascular: Negative for chest pain.  Genitourinary: Negative for dysuria, urgency and frequency.  Musculoskeletal: Positive for back pain.  Skin: Negative for wound.  Neurological: Negative for dizziness, syncope and headaches.      Allergies  Cefuroxime; Azithromycin; Contrast media; Oxycodone; Phenergan; Statins; and Amoxicillin  Home Medications   Prior to Admission medications   Medication Sig Start Date End Date Taking? Authorizing  Provider  aspirin 325 MG tablet Take 325 mg by mouth daily.   Yes Historical Provider, MD  atenolol (TENORMIN) 25 MG tablet Take 1 tablet (25 mg total) by mouth daily. 11/23/13  Yes Lyn RecordsHenry W Smith, MD  Coenzyme Q10 (COQ-10) 50 MG CAPS Take 1 capsule by mouth 2 (two) times daily.   Yes Historical Provider, MD  KLOR-CON M20 20 MEQ tablet TAKE 2 TABLETS BY MOUTH EVERY MORNING AND 2 TABLETS BY MOUTH EVERY EVENING Patient taking differently: Take 1 tablet by mouth twice daily 07/15/14  Yes Leighton Roachodd D McDiarmid, MD  Misc Natural Products (GRAPE SEED COMPLEX PO) Take 1 capsule by mouth 2 (two) times daily.   Yes Historical Provider, MD  Multiple Vitamin (MULTIVITAMIN WITH MINERALS) TABS tablet Take 1 tablet by mouth daily.   Yes Historical Provider, MD  Omega-3 Fatty Acids (FISH OIL PO) Take 1 capsule by mouth 2 (two) times daily.    Yes Historical  Provider, MD  Probiotic Product (PROBIOTIC DAILY PO) Take 1 tablet by mouth daily.   Yes Historical Provider, MD  Red Yeast Rice 600 MG CAPS Take 1 capsule by mouth daily.   Yes Historical Provider, MD  diazepam (VALIUM) 5 MG tablet Take 1 tablet (5 mg total) by mouth 2 (two) times daily. 12/14/14   Derwood KaplanAnkit Takita Riecke, MD  ibuprofen (ADVIL,MOTRIN) 400 MG tablet Take 1 tablet (400 mg total) by mouth every 6 (six) hours as needed. 12/14/14   Derwood KaplanAnkit Deontra Pereyra, MD   Triage Vitals: BP 138/82 mmHg  Pulse 76  Temp(Src) 98.5 F (36.9 C) (Oral)  Resp 16  Wt 179 lb (81.194 kg)  SpO2 100%   Physical Exam  Constitutional: She is oriented to person, place, and time. She appears well-developed and well-nourished. No distress.  HENT:  Head: Normocephalic and atraumatic.  Eyes: EOM are normal.  Neck: Normal range of motion.  Cardiovascular: Normal rate, regular rhythm and normal heart sounds.   Pulmonary/Chest: Effort normal and breath sounds normal.  Lungs are clear to ascultation   Abdominal: Soft. She exhibits no distension. There is no tenderness.  Musculoskeletal: Normal range of motion.  L upper and lower back without ecchymosis or deformity No spasms appreciated  No tenderness to palpation  Neurological: She is alert and oriented to person, place, and time.  Skin: Skin is warm and dry.  Psychiatric: She has a normal mood and affect. Judgment normal.  Nursing note and vitals reviewed.   ED Course  Procedures (including critical care time)  DIAGNOSTIC STUDIES: Oxygen Saturation is 99RA% on RA, Normal by my interpretation.    COORDINATION OF CARE: 2:31 AM-Discussed treatment plan with pt at bedside and pt agreed to plan.     Labs Review Labs Reviewed  COMPREHENSIVE METABOLIC PANEL - Abnormal; Notable for the following:    GFR calc non Af Amer 86 (*)    All other components within normal limits  CBC WITH DIFFERENTIAL/PLATELET - Abnormal; Notable for the following:    RBC 3.64 (*)     Hemoglobin 11.3 (*)    HCT 33.1 (*)    All other components within normal limits  TROPONIN I  URINALYSIS, ROUTINE W REFLEX MICROSCOPIC  CK    Imaging Review Dg Ribs Unilateral W/chest Left  12/14/2014   CLINICAL DATA:  LEFT flank pain radiating to upper back, worse with inspiration, beginning 2 days ago.  EXAM: LEFT RIBS AND CHEST - 3+ VIEW  COMPARISON:  Chest radiograph August 01, 2013  FINDINGS: No fracture or other  bone lesions are seen involving the ribs. There is no evidence of pneumothorax or pleural effusion. Bibasilar strandy densities. Low inspiratory examination. Both lungs are clear. Heart size and mediastinal contours are within normal limits.  IMPRESSION: Bibasilar strandy densities in this low inspiratory examination. No acute rib fracture deformity.   Electronically Signed   By: Awilda Metro   On: 12/14/2014 03:10     EKG Interpretation   Date/Time:  Tuesday December 14 2014 03:20:25 EDT Ventricular Rate:  65 PR Interval:  188 QRS Duration: 84 QT Interval:  400 QTC Calculation: 416 R Axis:   7 Text Interpretation:  Age not entered, assumed to be  69 years old for  purpose of ECG interpretation Sinus rhythm Left ventricular hypertrophy T  wave inversion in lead 3 No acute abnormaility Confirmed by Rhunette Croft, MD,  Sugar Vanzandt [phone]) on 12/14/2014 4:09:02 AM      MDM   Final diagnoses:  Muscle spasm of back    I personally performed the services described in this documentation, which was scribed in my presence. The recorded information has been reviewed and is accurate.   Pt comes in with cc of back pain. Pain is clinically musculoskeletal in nature - as it is worse with position, pt feels tightness, and is worse with deep breathing. Pt has no hx of PE, DVT and denies any exogenous estrogen use, long distance travels or surgery in the past 6 weeks, active cancer, recent immobilization. EKG appears normal, no right sided strain. No uti like sx, UA is clean. CXR is  normal.  Pt given valium - pain improved drastically. Will d.c, advised pcp f/u.    Derwood Kaplan, MD 12/14/14 [phone]

## 2014-12-14 NOTE — ED Notes (Signed)
EKG given to Dr. Nanavati. 

## 2014-12-14 NOTE — ED Notes (Signed)
Pt is sitting up on the side of the bed pt states that she cant lay down due to her back pain

## 2014-12-14 NOTE — ED Notes (Signed)
Dr. Nanavati at bedside 

## 2014-12-15 ENCOUNTER — Encounter: Payer: Self-pay | Admitting: Family Medicine

## 2014-12-17 ENCOUNTER — Encounter: Payer: Self-pay | Admitting: Family Medicine

## 2014-12-17 ENCOUNTER — Ambulatory Visit (INDEPENDENT_AMBULATORY_CARE_PROVIDER_SITE_OTHER): Payer: Medicare Other | Admitting: Family Medicine

## 2014-12-17 VITALS — BP 131/61 | HR 69 | Temp 98.0°F | Ht 64.0 in | Wt 180.2 lb

## 2014-12-17 DIAGNOSIS — M62830 Muscle spasm of back: Secondary | ICD-10-CM | POA: Diagnosis not present

## 2014-12-17 NOTE — Progress Notes (Signed)
  Patient name: [name] Savidge MRN [id number]  Date of birth: Jan 24, 1946  CC & HPI:  [name] Briles is Spencer 69 y.o. female presenting today for follow-up from ED visit for back pain.  She reports great improvement in left upper back pain.  She associates the pain with muscle spasm.  Reports she has been using heating pads as needed; Valium at night.  She has 2 pills of Valium left.  She also uses OTC NSAIDs.  She denies any neck pain; left arm weakness or numbness.  Denies any rash.  Denies any shoulder pain or decreased range of motion.   ROS: See HPI    Objective Findings:  Vitals: BP 131/61 mmHg  Pulse 69  Temp(Src) 98 F (36.7 C) (Oral)  Ht 5\' 4"  (1.626 m)  Wt 180 lb 3.2 oz (81.738 kg)  BMI 30.92 kg/m2  Gen: NAD CV: RRR w/o m/r/g, pulses +2 b/l Resp: CTAB w/ normal respiratory effort Back: Normal spinal alignment without cervical or thoracic vertebral tenderness; mild left periscapular tenderness.  Full range of motion left shoulder.  Upper extremity strength and sensation intact.  No rash  Assessment & Plan:   Please See Problem Focused Assessment & Plan

## 2014-12-19 DIAGNOSIS — I517 Cardiomegaly: Secondary | ICD-10-CM | POA: Diagnosis not present

## 2014-12-19 DIAGNOSIS — M62830 Muscle spasm of back: Secondary | ICD-10-CM | POA: Diagnosis not present

## 2015-01-03 ENCOUNTER — Other Ambulatory Visit: Payer: Self-pay | Admitting: Family Medicine

## 2015-01-05 ENCOUNTER — Other Ambulatory Visit: Payer: Self-pay | Admitting: Family Medicine

## 2015-02-01 ENCOUNTER — Encounter: Payer: Self-pay | Admitting: Family Medicine

## 2015-02-02 ENCOUNTER — Ambulatory Visit (INDEPENDENT_AMBULATORY_CARE_PROVIDER_SITE_OTHER): Payer: Medicare Other | Admitting: *Deleted

## 2015-02-02 DIAGNOSIS — Z111 Encounter for screening for respiratory tuberculosis: Secondary | ICD-10-CM | POA: Diagnosis not present

## 2015-02-02 NOTE — Progress Notes (Signed)
   PPD placed Left Forearm.  Pt to return 02/04/15 for reading.  Pt tolerated intradermal injection. Clovis PuMartin, Cylinda Santoli L, RN

## 2015-02-04 ENCOUNTER — Encounter: Payer: Self-pay | Admitting: *Deleted

## 2015-02-04 ENCOUNTER — Ambulatory Visit (INDEPENDENT_AMBULATORY_CARE_PROVIDER_SITE_OTHER): Payer: Medicare Other | Admitting: *Deleted

## 2015-02-04 DIAGNOSIS — Z111 Encounter for screening for respiratory tuberculosis: Secondary | ICD-10-CM

## 2015-02-04 DIAGNOSIS — Z7689 Persons encountering health services in other specified circumstances: Secondary | ICD-10-CM

## 2015-02-04 LAB — TB SKIN TEST
Induration: 0 mm
TB Skin Test: NEGATIVE

## 2015-02-04 NOTE — Progress Notes (Signed)
   PPD Reading Note PPD read and results entered in Epic[employer]. Result: 0 mm induration. Interpretation: Negative If test not read within 48-72 hours of initial placement, patient advised to repeat in other arm 1-3 weeks after this test. Allergic reaction: no  Ginger Leeth L, RN  

## 2015-04-06 ENCOUNTER — Ambulatory Visit (INDEPENDENT_AMBULATORY_CARE_PROVIDER_SITE_OTHER): Payer: Medicare Other | Admitting: Family Medicine

## 2015-04-06 ENCOUNTER — Encounter: Payer: Self-pay | Admitting: Family Medicine

## 2015-04-06 VITALS — BP 120/67 | HR 58 | Temp 98.3°F | Ht 64.0 in | Wt 179.6 lb

## 2015-04-06 DIAGNOSIS — M25471 Effusion, right ankle: Secondary | ICD-10-CM | POA: Insufficient documentation

## 2015-04-06 DIAGNOSIS — G4485 Primary stabbing headache: Secondary | ICD-10-CM | POA: Diagnosis not present

## 2015-04-06 DIAGNOSIS — E668 Other obesity: Secondary | ICD-10-CM

## 2015-04-06 HISTORY — DX: Effusion, right ankle: M25.471

## 2015-04-06 HISTORY — DX: Primary stabbing headache: G44.85

## 2015-04-06 NOTE — Patient Instructions (Signed)
Nice to meet you today. For your headaches, we will get an MRI of your brain to look at the blood vessels and make sure you don't have an aneurysm. Please stop taking any ibuprofen, Motrin, Advil, naproxen and take Tylenol instead for pain.  Follow-up with your primary care doctor in one month. We will call you about the results of the MRI.  Take care, Dr. BLeonard Schwartz

## 2015-04-06 NOTE — Assessment & Plan Note (Signed)
Most likely tension type headaches No facial pain consistent with temporal arteritis or giant cell arteritis Given strong family history of cerebral aneurysms will get MRA Given subacute worsening and normal neuro exam in clinic today, no need for emergent CTA Follow-up in one month

## 2015-04-06 NOTE — Assessment & Plan Note (Signed)
No swelling or tenderness noted on exam Likely related to dependent edema when on feet throughout the day Right ankle is likely more prone to swelling as it was previously fractured many years ago Continue to monitor

## 2015-04-06 NOTE — Progress Notes (Signed)
   Subjective:   [name] is a 69 y.o. female with a history of GERD, OA, adjustment disorder with mixed anxiety and depressed mood here for headaches, R ankle swelling and weight gain.  Headaches - started years ago, but worsening over last few months - come on suddenly, last for a few seconds to minutes at a time - can occur as frequently as q69min and as rare as every few weeks - sharp and throbbing, frontal or occipital - family h/o cerebral aneurysms, including 3 siblings, so she is worried about that - 3 family members have died, one has disability after intracranial bleed, and one sister - also has had a hard time with recent separation with husband so wonders if stress is related - denies N/V, numbness, weakness, speech changes, vision changes + rare photo/phonophobia - doesn't notice anything that makes them worse or come on, or makes them better  R ankle swelling - started noticing it a few months ago - intermittent - unsure what makes it worse or better - started a job last month and is on her feet more than usual - not painful  Weight gain - thinks it is because friends have been taking her out to eat a lot  - plans to get on diet (to a diet that previous helped with weight loss - grilled or baked lean means, more vegetables, less sugar) and exercise plan (classic stretch program videos, Tai-bo, Zumba)  Review of Systems:  Per HPI. All other systems reviewed and are negative.   PMH, PSH, Medications, Allergies, and FmHx reviewed and updated in EMR.  Social History: never smoker  Objective:  BP 120/67 mmHg  Pulse 58  Temp(Src) 98.3 F (36.8 C) (Oral)  Ht $R'5\' 4"'oe$  (1.626 m)  Wt 179 lb 9.6 oz (81.466 kg)  BMI 30.81 kg/m2  Gen:  69 y.o. female in NAD HEENT: NCAT, MMM, EOMI, PERRL, anicteric sclerae CV: RRR, no MRG, no JVD Resp: Non-labored, CTAB, no wheezes noted Abd: Soft, NTND, BS present, no guarding or organomegaly Ext: WWP, no edema MSK: Full ROM,  strength intact, no swelling or TTP of R ankle Neuro: CN 2-12 intact, Alert and oriented, speech normal, strength intake in all extremities, sensation intact to light touch, FNF and RAM intact, heel shin intact, - Romberg.     Assessment:     [name] is a 70 y.o. female here for headaches, R ankle Swelling, and weight gain.    Plan:     See problem list for problem-specific plans.   Virginia Crews, MD PGY-2,  [facility] 04/06/2015  2:26 PM

## 2015-04-06 NOTE — Assessment & Plan Note (Signed)
Encouraged patient to continue with weight loss efforts Patient are the has diet and exercise plans in place Follow-up at next visit

## 2015-04-21 ENCOUNTER — Ambulatory Visit (HOSPITAL_COMMUNITY)
Admission: RE | Admit: 2015-04-21 | Discharge: 2015-04-21 | Disposition: A | Discharge status: Home / Self Care | Payer: Medicare Other | Source: Ambulatory Visit | Attending: Family Medicine | Admitting: Family Medicine

## 2015-04-21 DIAGNOSIS — G4485 Primary stabbing headache: Secondary | ICD-10-CM

## 2015-04-21 DIAGNOSIS — R51 Headache: Secondary | ICD-10-CM | POA: Diagnosis not present

## 2015-05-05 ENCOUNTER — Other Ambulatory Visit: Payer: Self-pay | Admitting: Family Medicine

## 2015-05-05 DIAGNOSIS — Z1231 Encounter for screening mammogram for malignant neoplasm of breast: Secondary | ICD-10-CM

## 2015-05-17 ENCOUNTER — Other Ambulatory Visit: Payer: Self-pay | Admitting: Family Medicine

## 2015-05-17 ENCOUNTER — Ambulatory Visit (HOSPITAL_COMMUNITY)
Admission: RE | Admit: 2015-05-17 | Discharge: 2015-05-17 | Disposition: A | Discharge status: Home / Self Care | Payer: Medicare Other | Source: Ambulatory Visit | Attending: Family Medicine | Admitting: Family Medicine

## 2015-05-17 DIAGNOSIS — Z1231 Encounter for screening mammogram for malignant neoplasm of breast: Secondary | ICD-10-CM | POA: Diagnosis not present

## 2015-05-26 ENCOUNTER — Ambulatory Visit (INDEPENDENT_AMBULATORY_CARE_PROVIDER_SITE_OTHER): Payer: Medicare Other | Admitting: Family Medicine

## 2015-05-26 ENCOUNTER — Encounter: Payer: Self-pay | Admitting: Family Medicine

## 2015-05-26 VITALS — BP 116/53 | HR 60 | Temp 98.5°F | Ht 64.0 in | Wt 180.4 lb

## 2015-05-26 DIAGNOSIS — Z7289 Other problems related to lifestyle: Secondary | ICD-10-CM

## 2015-05-26 DIAGNOSIS — M1712 Unilateral primary osteoarthritis, left knee: Secondary | ICD-10-CM

## 2015-05-26 DIAGNOSIS — Z609 Problem related to social environment, unspecified: Secondary | ICD-10-CM

## 2015-05-26 DIAGNOSIS — E7800 Pure hypercholesterolemia, unspecified: Secondary | ICD-10-CM

## 2015-05-26 DIAGNOSIS — Z23 Encounter for immunization: Secondary | ICD-10-CM | POA: Diagnosis not present

## 2015-05-26 DIAGNOSIS — M1711 Unilateral primary osteoarthritis, right knee: Secondary | ICD-10-CM

## 2015-05-26 DIAGNOSIS — N819 Female genital prolapse, unspecified: Secondary | ICD-10-CM

## 2015-05-26 DIAGNOSIS — G44219 Episodic tension-type headache, not intractable: Secondary | ICD-10-CM

## 2015-05-26 DIAGNOSIS — I493 Ventricular premature depolarization: Secondary | ICD-10-CM

## 2015-05-26 NOTE — Patient Instructions (Addendum)
Think about trying Tylenol Arthritis tablets, one tablet three times a day regularly for knne osteoarthrtitis pain.  Sit to Stand exercise 10 times three times a day to strengthen the muscles of your leg and take the stress off your knees.   I will set up an appointmnet with Dr Hyacinth Meeker (Gynecologist) about your dropped bladder.    These are the goals we discussed: Goals    . Eat more fruits and vegetables- 3 vegetables a day 2 fruits a day    . Exercise 3x per week (30 min per time)    . Weight < 170 lb (77.111 kg)       This is a list of the screening recommended for you and due dates:  Health Maintenance  Topic Date Due  .  Hepatitis C: One time screening is recommended by Center for Disease Control  (CDC) for  adults born from [birth year] through 1966.   Apr 03, 1946  . Flu Shot  04/17/2016  . Colon Cancer Screening  09/17/2016  . Mammogram  05/16/2017  . Tetanus Vaccine  05/04/2018  . DEXA scan (bone density measurement)  Completed  . Shingles Vaccine  Completed  . Pneumonia vaccines  Completed

## 2015-05-27 ENCOUNTER — Telehealth: Payer: Self-pay | Admitting: *Deleted

## 2015-05-27 ENCOUNTER — Encounter: Payer: Self-pay | Admitting: Family Medicine

## 2015-05-27 DIAGNOSIS — M1711 Unilateral primary osteoarthritis, right knee: Secondary | ICD-10-CM | POA: Insufficient documentation

## 2015-05-27 DIAGNOSIS — N819 Female genital prolapse, unspecified: Secondary | ICD-10-CM | POA: Insufficient documentation

## 2015-05-27 DIAGNOSIS — M1712 Unilateral primary osteoarthritis, left knee: Secondary | ICD-10-CM | POA: Insufficient documentation

## 2015-05-27 LAB — HEPATITIS C ANTIBODY: HCV Ab: NEGATIVE

## 2015-05-27 LAB — LIPID PANEL
Cholesterol: 250 mg/dL — ABNORMAL HIGH (ref 125–200)
HDL: 56 mg/dL (ref >=46)
LDL Cholesterol: 176 mg/dL — ABNORMAL HIGH (ref <130)
Total CHOL/HDL Ratio: 4.5 Ratio (ref <=5.0)
Triglycerides: 92 mg/dL (ref <150)
VLDL: 18 mg/dL (ref <30)

## 2015-05-27 NOTE — Assessment & Plan Note (Signed)
New problem No further workup planned Discussed exercises for proximal leg muscles Trial of scheduled Tylenol Arthrtis one tablet three times a day May use Aleve prn twice a day for breakthru pain.  

## 2015-05-27 NOTE — Progress Notes (Signed)
   Subjective:    Patient ID: [name], female    DOB: 08-26-1946, 69 y.o.   MRN: [id number]  HPI  Bilateral knee pain Onset: over last year Location: front of knees Quality: aching Severity: moderate Function: stiff with getting up from chair.  Pain with climbing steps.  Duration: ~ 1 year Pattern: intermittent throughtout day Course: stable Radiation: no Relief: rest off legs Precipitant: standing  Associated Symptoms: no swelling of knees.  No redness of knees.  Trauma (Acute or Chronic): none Prior Diagnostic Testing or Treatments: Xrays (4V standing) of bilat knees 05/03/14 showed osteophytes in medical lateral compartments.  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  - No urge incontinence - No difficulty with bowel movements - (+) feels bulge sensation coming out of her vagina. (+) pressure sensation in perineal region - May have progressed over last few years. - No pain.  - Pt no longer has a GYN physician.  They have retired.   Headaches - Established problem - Resolved since MRI of brain.  - Not taking medications for headache.  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  - Taking Atenolol daily that was started by Dr Elsie Lincoln (card). . - No dizziness, lightheadedness - No falls.  - Does not limit walking or other activities.    Social History: Patient performs calisthenics several times a week.  Review of Systems No urinary incontinence (+) problems with blurry vision both eyes over last year.     Objective:   Physical Exam  Constitutional: She appears well-developed and well-nourished. No distress.  HENT:  Head: Normocephalic and atraumatic.  Right Ear: External ear normal.  Left Ear: External ear normal.  Eyes: Pupils are equal, round, and reactive to light.  Neck: No  thyromegaly present.  Cardiovascular: Normal rate, regular rhythm, normal heart sounds and intact distal pulses.   Pulmonary/Chest: Effort normal and breath sounds normal.  Musculoskeletal:       Right knee: She exhibits bony tenderness. She exhibits normal range of motion, no swelling, no deformity, no erythema, normal alignment, no LCL laxity and no MCL laxity. Tenderness (patella compression test) found. Medial joint line and lateral joint line tenderness noted. No MCL and no patellar tendon tenderness noted.       Left knee: She exhibits bony tenderness. She exhibits normal range of motion, no swelling, no effusion, no deformity, no erythema, normal alignment and no MCL laxity. Tenderness (patella compression test) found. Medial joint line and lateral joint line tenderness noted. No MCL and no LCL tenderness noted.   Normal Tandem stand balance test Sit to Stand test: 11 in 30 seconds (normal for age and gender)     Assessment & Plan:

## 2015-05-27 NOTE — Assessment & Plan Note (Signed)
New problem No further workup planned Discussed exercises for proximal leg muscles Trial of scheduled Tylenol Arthrtis one tablet three times a day May use Aleve prn twice a day for breakthru pain.

## 2015-05-27 NOTE — Assessment & Plan Note (Signed)
Established problem worsened.  Symptomatic sensation of bulging and pressure. Referral to DR M.S. Hyacinth Meeker (Gyn) for consideration of pessary therapy.

## 2015-05-27 NOTE — Telephone Encounter (Signed)
Will forward to Tia for referral. Xzavien Harada, CMA.

## 2015-05-27 NOTE — Telephone Encounter (Signed)
-----   Message from Leighton Roach McDiarmid, MD sent at 05/27/2015  1:01 PM EDT ----- Regarding: GYN referral Please arrange initial  consultation with: Jerene Bears, (GYN) Indication for consultation: Pelvic Prolapse Urgency of consultation: routine Clincal Staff given referral request: [facility] Referral order and letter signed. Fax to consultant referral letter and  _  Notify patient of appointment date and location. Thank you, Tawanna Cooler McDiarmid

## 2015-05-30 ENCOUNTER — Telehealth: Payer: Self-pay | Admitting: Family Medicine

## 2015-05-30 DIAGNOSIS — E785 Hyperlipidemia, unspecified: Secondary | ICD-10-CM

## 2015-05-30 NOTE — Telephone Encounter (Signed)
I discussed [name]'s abnormal lipids which are significantly improved from January 2016 with start of Red Yeast Rice.  Her ASCVD 10-year risk of CV event = 10.8%.  Recommendation is moderate to high dose statin.  [name] is hesitant to start a prescription statin.  She would like to work on her diet which she says has been poor and to increase her exercise.  She will continue on her Red Yeast rice therapy.  She will return in 3 months for a [facility] lab visit for an LDL-Direct to access if she has reduced her LDL to <130.  If not, she may consider a Statin.   I will  likely recommend she consider a high potency statin (without the Red Yeast Rice) in order to achieve a major reduction in LDL from the 247 mg/dL level she had before starting the Red Yeast Rice.

## 2015-06-06 ENCOUNTER — Telehealth: Payer: Self-pay | Admitting: Family Medicine

## 2015-06-06 DIAGNOSIS — K5281 Eosinophilic gastritis or gastroenteritis: Secondary | ICD-10-CM

## 2015-06-06 DIAGNOSIS — K52832 Lymphocytic colitis: Secondary | ICD-10-CM

## 2015-06-06 DIAGNOSIS — N819 Female genital prolapse, unspecified: Secondary | ICD-10-CM

## 2015-06-06 NOTE — Telephone Encounter (Signed)
Pt called and would like to speak to Dr. McDiarmid about her health issues. jw

## 2015-06-07 NOTE — Telephone Encounter (Signed)
Will forward to MD to place referrals.  Latishia Suitt,CMA

## 2015-06-07 NOTE — Telephone Encounter (Signed)
I discussed the GYN referral with Tia.  She was going to make appointment with [facility] instead of Dr Rondel Baton for [name].  Can you tell me the status of this referral? I have put the referral order in again for GYN if it is needed.   Please find out from [name] the reason she wants to see Dr Loreta Ave.

## 2015-06-07 NOTE — Telephone Encounter (Signed)
Pt is calling and would like a referral to Dr. Loreta Ave. She was also waiting for a referral to see Dr. Leda Quail at [facility]. . We sent in a referral but to the wrong place. jw

## 2015-06-08 ENCOUNTER — Emergency Department (HOSPITAL_COMMUNITY)
Admission: EM | Admit: 2015-06-08 | Discharge: 2015-06-09 | Disposition: A | Discharge status: Home / Self Care | Payer: Medicare Other | Attending: Emergency Medicine | Admitting: Emergency Medicine

## 2015-06-08 ENCOUNTER — Encounter (HOSPITAL_COMMUNITY): Payer: Self-pay | Admitting: Emergency Medicine

## 2015-06-08 DIAGNOSIS — E668 Other obesity: Secondary | ICD-10-CM | POA: Diagnosis not present

## 2015-06-08 DIAGNOSIS — R197 Diarrhea, unspecified: Secondary | ICD-10-CM | POA: Diagnosis not present

## 2015-06-08 DIAGNOSIS — R109 Unspecified abdominal pain: Secondary | ICD-10-CM | POA: Insufficient documentation

## 2015-06-08 DIAGNOSIS — Z79899 Other long term (current) drug therapy: Secondary | ICD-10-CM | POA: Insufficient documentation

## 2015-06-08 DIAGNOSIS — Z86010 Personal history of colonic polyps: Secondary | ICD-10-CM | POA: Insufficient documentation

## 2015-06-08 DIAGNOSIS — I493 Ventricular premature depolarization: Secondary | ICD-10-CM | POA: Insufficient documentation

## 2015-06-08 DIAGNOSIS — Z8701 Personal history of pneumonia (recurrent): Secondary | ICD-10-CM | POA: Diagnosis not present

## 2015-06-08 DIAGNOSIS — R112 Nausea with vomiting, unspecified: Secondary | ICD-10-CM

## 2015-06-08 DIAGNOSIS — Z90710 Acquired absence of both cervix and uterus: Secondary | ICD-10-CM | POA: Diagnosis not present

## 2015-06-08 DIAGNOSIS — F418 Other specified anxiety disorders: Secondary | ICD-10-CM | POA: Insufficient documentation

## 2015-06-08 DIAGNOSIS — F329 Major depressive disorder, single episode, unspecified: Secondary | ICD-10-CM | POA: Diagnosis not present

## 2015-06-08 DIAGNOSIS — Z880 Allergy status to penicillin: Secondary | ICD-10-CM | POA: Insufficient documentation

## 2015-06-08 DIAGNOSIS — K219 Gastro-esophageal reflux disease without esophagitis: Secondary | ICD-10-CM | POA: Diagnosis not present

## 2015-06-08 DIAGNOSIS — Z7982 Long term (current) use of aspirin: Secondary | ICD-10-CM | POA: Insufficient documentation

## 2015-06-08 DIAGNOSIS — Z862 Personal history of diseases of the blood and blood-forming organs and certain disorders involving the immune mechanism: Secondary | ICD-10-CM | POA: Diagnosis not present

## 2015-06-08 LAB — COMPREHENSIVE METABOLIC PANEL
ALT: 21 U/L (ref 14–54)
AST: 24 U/L (ref 15–41)
Albumin: 4 g/dL (ref 3.5–5.0)
Alkaline Phosphatase: 90 U/L (ref 38–126)
Anion gap: 12 (ref 5–15)
BUN: 15 mg/dL (ref 6–20)
CO2: 22 mmol/L (ref 22–32)
Calcium: 9.6 mg/dL (ref 8.9–10.3)
Chloride: 102 mmol/L (ref 101–111)
Creatinine, Ser: 0.89 mg/dL (ref 0.44–1.00)
GFR calc Af Amer: >60 mL/min (ref >60)
GFR calc non Af Amer: >60 mL/min (ref >60)
Glucose, Bld: 116 mg/dL — ABNORMAL HIGH (ref 65–99)
Potassium: 2.6 mmol/L — CL (ref 3.5–5.1)
Sodium: 136 mmol/L (ref 135–145)
Total Bilirubin: 0.7 mg/dL (ref 0.3–1.2)
Total Protein: 8.4 g/dL — ABNORMAL HIGH (ref 6.5–8.1)

## 2015-06-08 LAB — LIPASE, BLOOD: Lipase: 18 U/L — ABNORMAL LOW (ref 22–51)

## 2015-06-08 LAB — CBC
HCT: 36.3 % (ref 36.0–46.0)
Hemoglobin: 12.4 g/dL (ref 12.0–15.0)
MCH: 30.5 pg (ref 26.0–34.0)
MCHC: 34.2 g/dL (ref 30.0–36.0)
MCV: 89.4 fL (ref 78.0–100.0)
Platelets: 376 10*3/uL (ref 150–400)
RBC: 4.06 MIL/uL (ref 3.87–5.11)
RDW: 12.4 % (ref 11.5–15.5)
WBC: 5.8 10*3/uL (ref 4.0–10.5)

## 2015-06-08 MED ORDER — POTASSIUM CHLORIDE CRYS ER 20 MEQ PO TBCR
40.0000 meq | EXTENDED_RELEASE_TABLET | Freq: Once | ORAL | Status: AC
  Administered 2015-06-09: 40 meq via ORAL
  Filled 2015-06-08: qty 2

## 2015-06-08 MED ORDER — ONDANSETRON 4 MG PO TBDP
4.0000 mg | ORAL_TABLET | Freq: Once | ORAL | Status: AC
  Administered 2015-06-08: 4 mg via ORAL
  Filled 2015-06-08: qty 1

## 2015-06-08 NOTE — Telephone Encounter (Signed)
As discussed with Dr. Perley Jain, [facility] is not accepting any pt with Medicare or Medicare replacement plans at this time. Sent referral to [facility]. Per Debarah Crape at [facility], patient was called on 05/31/15 and LM was left to return call to schedule appt. Patient will need to call their office back to scheduled appt.  [phone]

## 2015-06-08 NOTE — Telephone Encounter (Signed)
Patient states that she is experiencing the same abdominal pain and symptoms from when she was in hospital in 2011.  States that she has seen Dr. Loreta Ave for this issue but due to her insurance she needs a referral to return to this office.  Patient states that she has been under the weather with this for 6 days and no relief or help from "anyone."  I offered patient an appt to be seen in our clinic but she declined that she only wants to see Dr. Loreta Ave for this.  I also told patient the status of her GYN referral.  She said that she did speak with someone at [facility] and they only had 2 female providers.  I advised her to contact [facility] again and let them know that she would only like to see a female and see if they have someone that might be there sometimes.  Will forward to MD to make him aware. Jazmin Hartsell,CMA

## 2015-06-08 NOTE — ED Notes (Signed)
Pt states Saturday she began feeling nauseous, could not vomit, moderate amounts of diarrhea, and abdominal pain. Pt states it's worsened as she's gone into the week. Pt states her symptoms and pain feel like the last time she had lymphocytic colitis.

## 2015-06-08 NOTE — ED Provider Notes (Signed)
CSN: [id number]     Arrival date & time 06/08/15  2150 History   First MD Initiated Contact with Patient 06/08/15 2230     Chief Complaint  Patient presents with  . Abdominal Pain  . Nausea  . Diarrhea     (Consider location/radiation/quality/duration/timing/severity/associated sxs/prior Treatment) HPI [name] is a 69 y.o. female who comes in for evaluation of abdominal discomfort with nausea, vomiting and diarrhea. Patient states her symptoms have been present for the past 5 days. She reports loss of appetite and only a couple bites of food before she stops. No fevers at home but does report intermittent chills. She has had intermittent nausea and vomiting. She reports chronic diarrhea. She denies any bloody or dark stools. She reports associated fatigue. Reports her abdominal discomfort has improved since being in the ED without intervention and rates it currently is a 3/10. She cannot characterize her discomfort. No other aggravating factors.  Past Medical History  Diagnosis Date  . Anemia   . Irregular heart beat   . 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 History of pneumonia 05/17/2011  . SINUSITIS 11/17/2009    Qualifier: History of  By: McDiarmid MD, Tawanna Cooler    . HIATAL HERNIA 07/08/1997    Annotation: Dx on abdominal ultrasound Qualifier: Diagnosis of  By: McDiarmid MD, Tawanna Cooler    . Premature ventricular contractions 11/17/2009    Qualifier: Diagnosis of  By: McDiarmid MD, Tawanna Cooler    . 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 GASTROESOPHAGEAL REFLUX DISEASE 11/17/2009    Qualifier: Diagnosis of  By: McDiarmid MD, Tawanna Cooler    . TROCHANTERIC BURSITIS, RIGHT 11/17/2009    Qualifier: History of  By: McDiarmid MD, Tawanna Cooler    . TRIGGER FINGER, RIGHT THUMB 05/09/2001    Qualifier: History of  By: McDiarmid MD, Tawanna Cooler    . Right knee pain 04/29/2014  . Right ankle swelling 04/06/2015  . Personal history of other disorders of nervous system and sense organs 11/17/2009    Centricity Description: BENIGN POSITIONAL  VERTIGO, HX OF Qualifier: Diagnosis of  By: McDiarmid MD, Tawanna Cooler   Centricity Description: CARPAL TUNNEL SYNDROME, BILATERAL, HX OF Qualifier: History of  By: McDiarmid MD, Tawanna Cooler    . Paresthesia of lower limb 01/16/2011  . OPHTHALMIC MIGRAINES, HX OF 06/08/2008    Qualifier: Diagnosis of  By: McDiarmid MD, Tawanna Cooler    . Moderate obesity 11/17/2009    Qualifier: Diagnosis of  By: McDiarmid MD, Tawanna Cooler    . Jaw pain 10/28/2014  . HYPOKALEMIA, HX OF 11/17/2009    Presumed secondary to lymphocytic colitis.    . Hyperlipidemia 09/24/2014  . History of pneumonia 05/17/2011    Infiltrate developed on CXR with fever during hospitalization 05/15/11 for N/V secondary to to Eosinophilic gastroenteritis.  Suspect Aspiration pneumonitis.    Marland Kitchen History of colon polyps 11/20/2010    Dx in 2008 by Dr Charna Elizabeth.    Marland Kitchen DEPRESSION, HX OF 11/17/2009    Qualifier: Diagnosis of  By: McDiarmid MD, Tawanna Cooler    . ANEMIA, MILD, HX OF 11/21/2009    Qualifier: Diagnosis of  By: McDiarmid MD, Tawanna Cooler    . ANAL FISSURE, HX OF 11/17/2009    Qualifier: Diagnosis of  By: McDiarmid MD, Tawanna Cooler    . Adjustment disorder with mixed anxiety and depressed mood 10/28/2014  . 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 Osteopenia 12/14/2014    T socre lumbar in 2015 = (-) 1.8. FRAX 5% major fracture 10 year risk.  0.5% 10 year risk hip fracture.     Past Surgical History  Procedure Laterality Date  . Abdominal hysterectomy  1988    partial  . Hernia repair      umbilical, repaired twice.   . Eye surgery      terygium  . Breast surgery      extraction  . Appendectomy    . Cardiac catheterization  12/2006    Dr Lacretia Nicks. Elsie [facility]. No obstruction.  Normal LV  . 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 No polyps  or masses.   . Trigger finger release      Release of stenosising tenosynovitis of thumbs  . Carpal tunnel release      bilateral wrists.   . Gum surgery    . Tonsillectomy    . Tubal ligation  1975  . Dilation and curettage of uterus  1988  . Biopsy breast  09/2001  . Ultrasound of gall bladder with cck-injection  1999    normal  . Ugi barium  1999    normal   Family History  Problem Relation Age of Onset  . Cancer Mother     Breast  . Hearing loss Mother   . Heart disease Father   . Stroke Father   . Stroke Brother    Social History  Substance Use Topics  . Smoking status: Never Smoker   . Smokeless tobacco: Never Used  . Alcohol Use: 0.5 oz/week    1 drink(s) per week   OB History    Gravida Para Term Preterm AB TAB SAB Ectopic Multiple Living   3              Review of Systems A 10 point review of systems was completed and was negative except for pertinent positives and negatives as mentioned in the history of present illness    Allergies  Cefuroxime; Azithromycin; Contrast media; Oxycodone; Phenergan; Shellfish allergy; Statins; and Amoxicillin  Home Medications   Prior to Admission medications   Medication Sig Start Date End Date Taking? Authorizing Provider  aspirin 81 MG tablet Take 1 tablet (81 mg total) by mouth daily. 05/26/15  Yes Todd D McDiarmid, MD  atenolol (TENORMIN) 25 MG tablet TAKE 1 TABLET (25 MG TOTAL) BY MOUTH DAILY. 01/05/15  Yes Leighton Roach McDiarmid, MD  Calcium Carb-Cholecalciferol (CALCIUM 600 + D PO) Take 1 tablet by mouth daily.    Yes Historical Provider, MD  Coenzyme Q10 (COQ-10) 50 MG CAPS Take 1 capsule by mouth 2 (two) times daily.   Yes Historical Provider, MD  COLLAGEN PO Take 2 capsules by mouth daily.   Yes Historical Provider, MD  Evening Primrose topical oil Apply topically as needed for dry skin.   Yes Historical Provider, MD  famotidine-calcium carbonate-magnesium hydroxide (PEPCID COMPLETE) [phone] MG CHEW chewable tablet  Chew 1 tablet by mouth daily as needed (heartburn/acid reflux).    Yes Historical Provider, MD  KLOR-CON M20 20 MEQ tablet TAKE 2 TABLETS BY MOUTH EVERY MORNING AND 2 TABLETS BY MOUTH EVERY EVENING Patient taking differently: take one tablet twice 01/05/15  Yes Leighton Roach McDiarmid, MD  Misc Natural Products (GRAPE SEED COMPLEX PO) Take 1 capsule by mouth 2 (two) times daily.  Yes Historical Provider, MD  Multiple Vitamin (MULTIVITAMIN WITH MINERALS) TABS tablet Take 1 tablet by mouth daily.   Yes Historical Provider, MD  Omega-3 Fatty Acids (FISH OIL PO) Take 1 capsule by mouth 2 (two) times daily.    Yes Historical Provider, MD  Probiotic Product (PROBIOTIC DAILY PO) Take 1 tablet by mouth daily.   Yes Historical Provider, MD  Red Yeast Rice 600 MG CAPS Take 2 capsules by mouth daily.    Yes Historical Provider, MD   BP 141/78 mmHg  Pulse 81  Temp(Src) 99.2 F (37.3 C) (Oral)  Resp 18  SpO2 97% Physical Exam  Constitutional: She is oriented to person, place, and time. She appears well-developed and well-nourished.  HENT:  Head: Normocephalic and atraumatic.  Mouth/Throat: Oropharynx is clear and moist.  Eyes: Conjunctivae are normal. Pupils are equal, round, and reactive to light. Right eye exhibits no discharge. Left eye exhibits no discharge. No scleral icterus.  Neck: Neck supple.  Cardiovascular: Normal rate, regular rhythm and normal heart sounds.   Pulmonary/Chest: Effort normal and breath sounds normal. No respiratory distress. She has no wheezes. She has no rales.  Abdominal: Soft. She exhibits no distension and no mass. There is no tenderness. There is no rebound and no guarding.  Musculoskeletal: She exhibits no tenderness.  Neurological: She is alert and oriented to person, place, and time.  Cranial Nerves II-XII grossly intact  Skin: Skin is warm and dry. No rash noted.  Psychiatric: She has a normal mood and affect.  Nursing note and vitals reviewed.   ED Course   Procedures (including critical care time) Labs Review Labs Reviewed  LIPASE, BLOOD - Abnormal; Notable for the following:    Lipase 18 (*)    All other components within normal limits  COMPREHENSIVE METABOLIC PANEL - Abnormal; Notable for the following:    Potassium 2.6 (*)    Glucose, Bld 116 (*)    Total Protein 8.4 (*)    All other components within normal limits  URINALYSIS, ROUTINE W REFLEX MICROSCOPIC (NOT AT [facility]) - Abnormal; Notable for the following:    Color, Urine AMBER (*)    APPearance CLOUDY (*)    Bilirubin Urine SMALL (*)    Protein, ur 30 (*)    All other components within normal limits  URINE MICROSCOPIC-ADD ON - Abnormal; Notable for the following:    Bacteria, UA FEW (*)    Casts HYALINE CASTS (*)    All other components within normal limits  CBC    Imaging Review No results found. I have personally reviewed and evaluated these images and lab results as part of my medical decision-making.   EKG Interpretation None     Meds given in ED:  Medications  potassium chloride SA (K-DUR,KLOR-CON) CR tablet 40 mEq (40 mEq Oral Given 06/09/15 0021)  ondansetron (ZOFRAN-ODT) disintegrating tablet 4 mg (4 mg Oral Given 06/08/15 2354)    New Prescriptions   No medications on file   Filed Vitals:   06/08/15 2155 06/08/15 2352  BP: 136/67 141/78  Pulse: 87 81  Temp: 99.2 F (37.3 C)   TempSrc: Oral   Resp: 18 18  SpO2: 98% 97%    MDM  Vitals stable - WNL -afebrile Pt resting comfortably in ED. PE--benign abdominal exam. Physical exam otherwise unremarkable Labwork-potassium 2.6, given oral potassium in the ED and tolerated well. labs are otherwise noncontributory. UA negative  Patient with nausea, vomiting and diarrhea for the past 5 days. Appears comfortable  in the ED, is eating applesauce and watching TV in no apparent distress. No vomiting or diarrhea in the ED. Reports the Zofran made her feel much better. Will DC with Zofran. Encouraged patient to  stay well-hydrated at home with water, Gatorade. No evidence of other acute or emergent pathology at this time. Patient stable, in good condition and is appropriate for discharge. I discussed all relevant lab findings and imaging results with pt and they verbalized understanding. Discussed f/u with PCP within 48 hrs and return precautions, pt very amenable to plan.  Final diagnoses:  Nausea vomiting and diarrhea       Joycie Peek, PA-C 06/09/15 1610  Elwin Mocha, MD 06/09/15 [phone]

## 2015-06-08 NOTE — ED Notes (Signed)
Tasha from lab, potassium 2.6

## 2015-06-09 LAB — URINALYSIS, ROUTINE W REFLEX MICROSCOPIC
Glucose, UA: NEGATIVE mg/dL
Hgb urine dipstick: NEGATIVE
Ketones, ur: NEGATIVE mg/dL
Leukocytes, UA: NEGATIVE
Nitrite: NEGATIVE
Protein, ur: 30 mg/dL — AB
Specific Gravity, Urine: 1.018 (ref 1.005–1.030)
Urobilinogen, UA: 0.2 mg/dL (ref 0.0–1.0)
pH: 6 (ref 5.0–8.0)

## 2015-06-09 LAB — URINE MICROSCOPIC-ADD ON

## 2015-06-09 MED ORDER — ONDANSETRON 4 MG PO TBDP
4.0000 mg | ORAL_TABLET | Freq: Once | ORAL | Status: AC
Start: 2015-06-09 — End: 2015-06-09
  Administered 2015-06-09: 4 mg via ORAL
  Filled 2015-06-09: qty 1

## 2015-06-09 MED ORDER — ONDANSETRON HCL 4 MG PO TABS: 4.0000 mg | ORAL_TABLET | Freq: Four times a day (QID) | ORAL | Status: DC

## 2015-06-09 NOTE — Telephone Encounter (Signed)
Referral placed for Dr Loreta Ave to evaluate Ms Graddy's abdominal pain given her history of eosiniphilic gastroenteritis.   Ask Ms Fay to tell us which GYN physician with whom she would like to consult and accepts her Medicare.  We will be happy to facilitate the referral to them for her.   Referral Letter available for faxing to Dr Loreta Ave.

## 2015-06-09 NOTE — Discharge Instructions (Signed)
Please take your medications as prescribed to help with her nausea. Your exam and labs are very reassuring today. Your symptoms are likely related to a viral process. Please follow-up with your doctor this week for reevaluation as needed. Return to ED for worsening symptoms.  Diarrhea Diarrhea is frequent loose and watery bowel movements. It can cause you to feel weak and dehydrated. Dehydration can cause you to become tired and thirsty, have a dry mouth, and have decreased urination that often is dark yellow. Diarrhea is a sign of another problem, most often an infection that will not last long. In most cases, diarrhea typically lasts 2-3 days. However, it can last longer if it is a sign of something more serious. It is important to treat your diarrhea as directed by your caregiver to lessen or prevent future episodes of diarrhea. CAUSES  Some common causes include:  Gastrointestinal infections caused by viruses, bacteria, or parasites.  Food poisoning or food allergies.  Certain medicines, such as antibiotics, chemotherapy, and laxatives.  Artificial sweeteners and fructose.  Digestive disorders. HOME CARE INSTRUCTIONS  Ensure adequate fluid intake (hydration): Have 1 cup (8 oz) of fluid for each diarrhea episode. Avoid fluids that contain simple sugars or sports drinks, fruit juices, whole milk products, and sodas. Your urine should be clear or pale yellow if you are drinking enough fluids. Hydrate with an oral rehydration solution that you can purchase at pharmacies, retail stores, and online. You can prepare an oral rehydration solution at home by mixing the following ingredients together:   - tsp table salt.   tsp baking soda.   tsp salt substitute containing potassium chloride.  1  tablespoons sugar.  1 L (34 oz) of water.  Certain foods and beverages may increase the speed at which food moves through the gastrointestinal (GI) tract. These foods and beverages should be avoided  and include:  Caffeinated and alcoholic beverages.  High-fiber foods, such as raw fruits and vegetables, nuts, seeds, and whole grain breads and cereals.  Foods and beverages sweetened with sugar alcohols, such as xylitol, sorbitol, and mannitol.  Some foods may be well tolerated and may help thicken stool including:  Starchy foods, such as rice, toast, pasta, low-sugar cereal, oatmeal, grits, baked potatoes, crackers, and bagels.  Bananas.  Applesauce.  Add probiotic-rich foods to help increase healthy bacteria in the GI tract, such as yogurt and fermented milk products.  Wash your hands well after each diarrhea episode.  Only take over-the-counter or prescription medicines as directed by your caregiver.  Take a warm bath to relieve any burning or pain from frequent diarrhea episodes. SEEK IMMEDIATE MEDICAL CARE IF:   You are unable to keep fluids down.  You have persistent vomiting.  You have blood in your stool, or your stools are black and tarry.  You do not urinate in 6-8 hours, or there is only a small amount of very dark urine.  You have abdominal pain that increases or localizes.  You have weakness, dizziness, confusion, or light-headedness.  You have a severe headache.  Your diarrhea gets worse or does not get better.  You have a fever or persistent symptoms for more than 2-3 days.  You have a fever and your symptoms suddenly get worse. MAKE SURE YOU:   Understand these instructions.  Will watch your condition.  Will get help right away if you are not doing well or get worse. Document Released: 08/24/2002 Document Revised: 01/18/2014 Document Reviewed: 05/11/2012 [facility] Patient Information 2015 [place], [place].  This information is not intended to replace advice given to you by your health care provider. Make sure you discuss any questions you have with your health care provider.  Nausea and Vomiting Nausea is a sick feeling that often comes before  throwing up (vomiting). Vomiting is a reflex where stomach contents come out of your mouth. Vomiting can cause severe loss of body fluids (dehydration). Children and elderly adults can become dehydrated quickly, especially if they also have diarrhea. Nausea and vomiting are symptoms of a condition or disease. It is important to find the cause of your symptoms. CAUSES   Direct irritation of the stomach lining. This irritation can result from increased acid production (gastroesophageal reflux disease), infection, food poisoning, taking certain medicines (such as nonsteroidal anti-inflammatory drugs), alcohol use, or tobacco use.  Signals from the brain.These signals could be caused by a headache, heat exposure, an inner ear disturbance, increased pressure in the brain from injury, infection, a tumor, or a concussion, pain, emotional stimulus, or metabolic problems.  An obstruction in the gastrointestinal tract (bowel obstruction).  Illnesses such as diabetes, hepatitis, gallbladder problems, appendicitis, kidney problems, cancer, sepsis, atypical symptoms of a heart attack, or eating disorders.  Medical treatments such as chemotherapy and radiation.  Receiving medicine that makes you sleep (general anesthetic) during surgery. DIAGNOSIS Your caregiver may ask for tests to be done if the problems do not improve after a few days. Tests may also be done if symptoms are severe or if the reason for the nausea and vomiting is not clear. Tests may include:  Urine tests.  Blood tests.  Stool tests.  Cultures (to look for evidence of infection).  X-rays or other imaging studies. Test results can help your caregiver make decisions about treatment or the need for additional tests. TREATMENT You need to stay well hydrated. Drink frequently but in small amounts.You may wish to drink water, sports drinks, clear broth, or eat frozen ice pops or gelatin dessert to help stay hydrated.When you eat, eating  slowly may help prevent nausea.There are also some antinausea medicines that may help prevent nausea. HOME CARE INSTRUCTIONS   Take all medicine as directed by your caregiver.  If you do not have an appetite, do not force yourself to eat. However, you must continue to drink fluids.  If you have an appetite, eat a normal diet unless your caregiver tells you differently.  Eat a variety of complex carbohydrates (rice, wheat, potatoes, bread), lean meats, yogurt, fruits, and vegetables.  Avoid high-fat foods because they are more difficult to digest.  Drink enough water and fluids to keep your urine clear or pale yellow.  If you are dehydrated, ask your caregiver for specific rehydration instructions. Signs of dehydration may include:  Severe thirst.  Dry lips and mouth.  Dizziness.  Dark urine.  Decreasing urine frequency and amount.  Confusion.  Rapid breathing or pulse. SEEK IMMEDIATE MEDICAL CARE IF:   You have blood or brown flecks (like coffee grounds) in your vomit.  You have black or bloody stools.  You have a severe headache or stiff neck.  You are confused.  You have severe abdominal pain.  You have chest pain or trouble breathing.  You do not urinate at least once every 8 hours.  You develop cold or clammy skin.  You continue to vomit for longer than 24 to 48 hours.  You have a fever. MAKE SURE YOU:   Understand these instructions.  Will watch your condition.  Will get help  right away if you are not doing well or get worse. Document Released: 09/03/2005 Document Revised: 11/26/2011 Document Reviewed: 01/31/2011 [facility] Patient Information 2015 [place]. This information is not intended to replace advice given to you by your health care provider. Make sure you discuss any questions you have with your health care provider.

## 2015-06-13 ENCOUNTER — Encounter: Payer: Self-pay | Admitting: Obstetrics and Gynecology

## 2015-06-13 ENCOUNTER — Ambulatory Visit (INDEPENDENT_AMBULATORY_CARE_PROVIDER_SITE_OTHER): Payer: Medicare Other | Admitting: Obstetrics and Gynecology

## 2015-06-13 VITALS — BP 134/97 | HR 77 | Temp 97.9°F | Wt 172.0 lb

## 2015-06-13 DIAGNOSIS — E876 Hypokalemia: Secondary | ICD-10-CM | POA: Diagnosis not present

## 2015-06-13 DIAGNOSIS — R197 Diarrhea, unspecified: Secondary | ICD-10-CM | POA: Diagnosis not present

## 2015-06-13 DIAGNOSIS — K529 Noninfective gastroenteritis and colitis, unspecified: Secondary | ICD-10-CM

## 2015-06-13 DIAGNOSIS — R5383 Other fatigue: Secondary | ICD-10-CM | POA: Diagnosis not present

## 2015-06-13 LAB — COMPREHENSIVE METABOLIC PANEL
ALT: 14 U/L (ref 6–29)
AST: 15 U/L (ref 10–35)
Albumin: 3.8 g/dL (ref 3.6–5.1)
Alkaline Phosphatase: 79 U/L (ref 33–130)
BUN: 9 mg/dL (ref 7–25)
CO2: 22 mmol/L (ref 20–31)
Calcium: 9.4 mg/dL (ref 8.6–10.4)
Chloride: 107 mmol/L (ref 98–110)
Creat: 0.58 mg/dL (ref 0.50–0.99)
Glucose, Bld: 84 mg/dL (ref 65–99)
Potassium: 2.7 mmol/L — CL (ref 3.5–5.3)
Sodium: 141 mmol/L (ref 135–146)
Total Bilirubin: 0.7 mg/dL (ref 0.2–1.2)
Total Protein: 6.7 g/dL (ref 6.1–8.1)

## 2015-06-13 LAB — CBC WITH DIFFERENTIAL/PLATELET
Basophils Absolute: 0 10*3/uL (ref 0.0–0.1)
Basophils Relative: 0 % (ref 0–1)
Eosinophils Absolute: 0.2 10*3/uL (ref 0.0–0.7)
Eosinophils Relative: 4 % (ref 0–5)
HCT: 31.8 % — ABNORMAL LOW (ref 36.0–46.0)
Hemoglobin: 11.2 g/dL — ABNORMAL LOW (ref 12.0–15.0)
Lymphocytes Relative: 26 % (ref 12–46)
Lymphs Abs: 1.6 10*3/uL (ref 0.7–4.0)
MCH: 31.1 pg (ref 26.0–34.0)
MCHC: 35.2 g/dL (ref 30.0–36.0)
MCV: 88.3 fL (ref 78.0–100.0)
MPV: 8.6 fL (ref 8.6–12.4)
Monocytes Absolute: 0.8 10*3/uL (ref 0.1–1.0)
Monocytes Relative: 13 % — ABNORMAL HIGH (ref 3–12)
Neutro Abs: 3.4 10*3/uL (ref 1.7–7.7)
Neutrophils Relative %: 57 % (ref 43–77)
Platelets: 420 10*3/uL — ABNORMAL HIGH (ref 150–400)
RBC: 3.6 MIL/uL — ABNORMAL LOW (ref 3.87–5.11)
RDW: 13.6 % (ref 11.5–15.5)
WBC: 6 10*3/uL (ref 4.0–10.5)

## 2015-06-13 NOTE — Patient Instructions (Signed)
Getting blood work to make sure all your electrolytes are normal Please keep doctor's appointment with Gi doctor Ask GI doctor about going back to work - are you contagious? May need stool sample to test for source of diarrhea Continue to maintain oral hydration and food to keep energy level up  Colitis Colitis is inflammation of the colon. Colitis can be a short-term or long-standing (chronic) illness. Crohn's disease and ulcerative colitis are 2 types of colitis which are chronic. They usually require lifelong treatment. CAUSES  There are many different causes of colitis, including:  Viruses.  Germs (bacteria).  Medicine reactions. SYMPTOMS   Diarrhea.  Intestinal bleeding.  Pain.  Fever.  Throwing up (vomiting).  Tiredness (fatigue).  Weight loss.  Bowel blockage. DIAGNOSIS  The diagnosis of colitis is based on examination and stool or blood tests. X-rays, CT scan, and colonoscopy may also be needed. TREATMENT  Treatment may include:  Fluids given through the vein (intravenously).  Bowel rest (nothing to eat or drink for a period of time).  Medicine for pain and diarrhea.  Medicines (antibiotics) that kill germs.  Cortisone medicines.  Surgery. HOME CARE INSTRUCTIONS   Get plenty of rest.  Drink enough water and fluids to keep your urine clear or pale yellow.  Eat a well-balanced diet.  Call your caregiver for follow-up as recommended. SEEK IMMEDIATE MEDICAL CARE IF:   You develop chills.  You have an oral temperature above 102 F (38.9 C), not controlled by medicine.  You have extreme weakness, fainting, or dehydration.  You have repeated vomiting.  You develop severe belly (abdominal) pain or are passing bloody or tarry stools. MAKE SURE YOU:   Understand these instructions.  Will watch your condition.  Will get help right away if you are not doing well or get worse. Document Released: 10/11/2004 Document Revised: 11/26/2011 Document  Reviewed: 01/06/2010 [facility] Patient Information 2015 [place], [place]. This information is not intended to replace advice given to you by your health care provider. Make sure you discuss any questions you have with your health care provider.

## 2015-06-13 NOTE — Progress Notes (Signed)
  HPI: Patient presents with 10 day history of diarrhea and fatigue.  Patient was evaluated for those same symptoms in the ED on 9/21 and diagnosed with a viral illness.  Patient states since that time she has continued to have 5-8 watery stools a day.  Patient denies blood in stool.  Patient states she vomited several times on 9/20 and 9/21.  Patient denies vomiting since that time, but endorses nausea which she has effectively treated with Zofran at home.  Patient states she has stayed hydrated and is drinking liquids.  Patient states she has had a loss of appetite over the last 2 weeks, but makes herself eat.  Patient denies abdominal pain, fever and cough.  Patient has osteoarthritis, but denies pain otherwise.  Patient's potassium was 2.6 on 9/21 in the ED and patient received supplementation during her ED stay.  She was instructed to continue taking her prescribed K-Dur at home, but states she has been unable to tolerate that medication, stating, "it makes me gag."  Patient has an appointment with her gastroenterologist tomorrow.    ROS: See HPI.  PHYSICAL EXAM: BP 134/97 mmHg  Pulse 77  Temp(Src) 97.9 F (36.6 C) (Oral)  Wt 172 lb (78.019 kg)  Gen: Patient is a well appearing female in no apparent distress. HEENT: Normocephalic.  No rhinorrhea noted.  Buccal mucosa moist and pink.   Heart: S1/S2, no rub, murmur or gallop Lungs: Clear to auscultation in all lobes with no wheezing or crackles. Abd: soft, mild tenderness in LLQ, +BS, no guarding or distention.  Neuro: Alert & oriented to person, place and events. Ext: MAE  ASSESSMENT/PLAN: Colitis: Patient with h/o lymphocytic colitis. Has a GI doctor who follows her.  Patient will follow-up with GI physician tomorrow per previously scheduled appointment.   CBC and BMP will be drawn in clinic today to check Hgb and electrolytes.  Health maintenance:  -None at this time   FOLLOW UP: F/u with Dr. Loreta Ave tomorrow as previously scheduled.    Return precautions discussed such as fever, extreme weakness and altered LOC. Handout given  Nelly Rout, NP [facility]

## 2015-06-13 NOTE — Progress Notes (Deleted)
   Subjective:   Patient ID: [name], female    DOB: 1946/07/16, 69 y.o.   MRN: [id number]  Patient presents for Same Day Appointment  Chief Complaint  Patient presents with  . Diarrhea    HPI: # *** Complaint  Problem began *** days ago Progression: *** Medications tried: *** Anything improved it: *** Anything worsen it: *** Had similar problem before: ***     ***   Review of Systems   See HPI for ROS.   Past medical history, surgical, family, and social history reviewed and updated in the EMR as appropriate.  Objective:  BP 134/97 mmHg  Pulse 77  Temp(Src) 97.9 F (36.6 C) (Oral)  Wt 172 lb (78.019 kg) Vitals and nursing note reviewed  Physical Exam  Assessment & Plan:  See Problem List Documentation   Caryl Ada, DO 06/13/2015, 2:25 PM PGY-2, [facility]

## 2015-06-14 ENCOUNTER — Telehealth: Payer: Self-pay | Admitting: Family Medicine

## 2015-06-14 ENCOUNTER — Ambulatory Visit: Payer: Medicare Other | Admitting: Family Medicine

## 2015-06-14 ENCOUNTER — Telehealth: Payer: Self-pay | Admitting: *Deleted

## 2015-06-14 DIAGNOSIS — K219 Gastro-esophageal reflux disease without esophagitis: Secondary | ICD-10-CM | POA: Diagnosis not present

## 2015-06-14 DIAGNOSIS — E876 Hypokalemia: Secondary | ICD-10-CM

## 2015-06-14 DIAGNOSIS — R1013 Epigastric pain: Secondary | ICD-10-CM | POA: Diagnosis not present

## 2015-06-14 NOTE — Telephone Encounter (Signed)
-----   Message from Pincus Large, DO sent at 06/14/2015  8:48 AM EDT ----- Patient received a critical value of 2.7 on her lab. She has prescription of KDUR at home. Can you tell her that she needs to take now and then another tonight. She needs to be taking at least a tab daily. She should return in a week for recheck.   Thank you! Caryl Ada, DO

## 2015-06-14 NOTE — Telephone Encounter (Signed)
AFTER HOURS LINE  I received a call from [facility] labs after midnight concerning a critical potassium of 2.7 (confirmed on recheck). It appears patient has had diarrhea x 2 days in addition to fatigue. I have attempted to call the patient on her mobile number to instruct her to take [facility] now and then again in the AM, however it goes straight to voicemail (most likely because it is so late in the evening). I did NOT leave a voicemail.  FYI sent to ordering physician and PCP. Please attempt to update the patient in the AM.    Joanna Puff, MD [facility] Resident  06/14/2015, 12:21 AM

## 2015-06-14 NOTE — Telephone Encounter (Signed)
Spoke with patient and instructions given to her and repeated back to me.  She already scheduled a lab appt for 06/21/15 to recheck her potassium.  Please place the order for this. Jazmin Hartsell,CMA

## 2015-06-15 NOTE — Addendum Note (Signed)
Addended by: Pincus Large on: 06/15/2015 12:44 PM   Modules accepted: Orders

## 2015-06-21 ENCOUNTER — Telehealth: Payer: Self-pay | Admitting: Family Medicine

## 2015-06-21 ENCOUNTER — Other Ambulatory Visit: Payer: Medicare Other

## 2015-06-21 DIAGNOSIS — E876 Hypokalemia: Secondary | ICD-10-CM | POA: Diagnosis not present

## 2015-06-21 DIAGNOSIS — E785 Hyperlipidemia, unspecified: Secondary | ICD-10-CM | POA: Diagnosis not present

## 2015-06-21 LAB — POTASSIUM: Potassium: 2.7 mmol/L — CL (ref 3.5–5.3)

## 2015-06-21 LAB — LDL CHOLESTEROL, DIRECT: Direct LDL: 119 mg/dL (ref <130)

## 2015-06-21 NOTE — Progress Notes (Signed)
D-LDL AND K+ DONE TODAY [name]

## 2015-06-21 NOTE — Telephone Encounter (Signed)
Paged to [facility] Emergency Line @ approx 2330 by [facility] Fermo regarding patient [name] in reference to critical value potassium lab today of 2.7 (06/21/15), lab was a single potassium level (note not a metabolic panel).  Recent history with diagnosis of viral gastroenteritis and found to have Hypokalemia down to 2.6 in ED on 06/08/15. Repeat K of 2.7 in office on 06/14/15, she has since followed up with her GI physician Dr. Loreta Ave. Repeat K lab only visit ordered for today 10/4, with the above value of continued hypokalemia at 2.7. There was documented concern previously that patient was not taking her Kdur supplement BID (x 2 of the pills) due to the pill size and difficulty with them.  I attempted to call patient at her listed mobile #, unable to reach her tonight but left a voicemail asking her to call [facility] in morning due to repeat critical low value of potassium, advised to keep taking K supplement, page or call back if any concerns or questions.  Saralyn Pilar, DO [facility], PGY-3

## 2015-06-22 ENCOUNTER — Other Ambulatory Visit: Payer: Self-pay | Admitting: Family Medicine

## 2015-06-22 MED ORDER — ONDANSETRON HCL 4 MG PO TABS: 4.0000 mg | ORAL_TABLET | Freq: Four times a day (QID) | ORAL | Status: DC

## 2015-06-22 NOTE — Telephone Encounter (Signed)
Would like a refill on nausea medicine  [facility] [address]

## 2015-06-27 MED ORDER — ONDANSETRON HCL 4 MG PO TABS: 4.0000 mg | ORAL_TABLET | Freq: Four times a day (QID) | ORAL | Status: DC

## 2015-06-27 NOTE — Telephone Encounter (Signed)
Patient in nurse clinic for Rx Zofran.  Medication was approved on 06/22/15 but stated print.  Medication resent to [facility] electronically.  Clovis Pu, RN

## 2015-06-27 NOTE — Addendum Note (Signed)
Addended by: Clovis Pu on: 06/27/2015 12:20 PM   Modules accepted: Orders

## 2015-08-03 ENCOUNTER — Telehealth: Payer: Self-pay | Admitting: Family Medicine

## 2015-08-03 ENCOUNTER — Ambulatory Visit (INDEPENDENT_AMBULATORY_CARE_PROVIDER_SITE_OTHER): Payer: Medicare Other | Admitting: Family Medicine

## 2015-08-03 ENCOUNTER — Encounter: Payer: Self-pay | Admitting: Family Medicine

## 2015-08-03 VITALS — BP 153/56 | HR 70 | Temp 98.1°F | Ht 64.0 in | Wt 170.6 lb

## 2015-08-03 DIAGNOSIS — R634 Abnormal weight loss: Secondary | ICD-10-CM

## 2015-08-03 DIAGNOSIS — E876 Hypokalemia: Secondary | ICD-10-CM | POA: Insufficient documentation

## 2015-08-03 DIAGNOSIS — R197 Diarrhea, unspecified: Secondary | ICD-10-CM | POA: Insufficient documentation

## 2015-08-03 DIAGNOSIS — K52832 Lymphocytic colitis: Secondary | ICD-10-CM

## 2015-08-03 DIAGNOSIS — F4323 Adjustment disorder with mixed anxiety and depressed mood: Secondary | ICD-10-CM

## 2015-08-03 HISTORY — DX: Hypokalemia: E87.6

## 2015-08-03 HISTORY — DX: Abnormal weight loss: R63.4

## 2015-08-03 LAB — CBC
HCT: 32 % — ABNORMAL LOW (ref 36.0–46.0)
Hemoglobin: 10.7 g/dL — ABNORMAL LOW (ref 12.0–15.0)
MCH: 30.5 pg (ref 26.0–34.0)
MCHC: 33.4 g/dL (ref 30.0–36.0)
MCV: 91.2 fL (ref 78.0–100.0)
MPV: 8.6 fL (ref 8.6–12.4)
Platelets: 408 10*3/uL — ABNORMAL HIGH (ref 150–400)
RBC: 3.51 MIL/uL — ABNORMAL LOW (ref 3.87–5.11)
RDW: 13.9 % (ref 11.5–15.5)
WBC: 5.6 10*3/uL (ref 4.0–10.5)

## 2015-08-03 LAB — COMPREHENSIVE METABOLIC PANEL
ALT: 21 U/L (ref 6–29)
AST: 23 U/L (ref 10–35)
Albumin: 3.3 g/dL — ABNORMAL LOW (ref 3.6–5.1)
Alkaline Phosphatase: 114 U/L (ref 33–130)
BUN: 5 mg/dL — ABNORMAL LOW (ref 7–25)
CO2: 29 mmol/L (ref 20–31)
Calcium: 8.8 mg/dL (ref 8.6–10.4)
Chloride: 105 mmol/L (ref 98–110)
Creat: 0.56 mg/dL — ABNORMAL LOW (ref 0.60–0.93)
Glucose, Bld: 85 mg/dL (ref 65–99)
Potassium: 2.5 mmol/L — CL (ref 3.5–5.3)
Sodium: 144 mmol/L (ref 135–146)
Total Bilirubin: 0.7 mg/dL (ref 0.2–1.2)
Total Protein: 5.8 g/dL — ABNORMAL LOW (ref 6.1–8.1)

## 2015-08-03 LAB — MAGNESIUM: Magnesium: 1.8 mg/dL (ref 1.5–2.5)

## 2015-08-03 LAB — POCT H PYLORI SCREEN: H Pylori Screen, POC: NEGATIVE

## 2015-08-03 LAB — TSH: TSH: 0.471 u[IU]/mL (ref 0.350–4.500)

## 2015-08-03 NOTE — Telephone Encounter (Signed)
Family Medicine After hours phone call  Received page from lab. They informed me of critical lab value of K+ 2.5 from patient's lab draw from earlier today.  As it is 11pm and this patient appears to have been in this range for about 1 month I do not feel it is appropriate to call this patient at this time. I will pass this message to the ordering provider and PCP to have them address this tomorrow.   Kathee DeltonIan D Edin Skarda, MD,MS,  PGY2 08/03/2015 10:58 PM

## 2015-08-03 NOTE — Patient Instructions (Signed)
Make an appointment for Dr. McDiarmid next week. I will call with test results. He may want to do more testing or maybe start you on something for depression.

## 2015-08-04 MED ORDER — POTASSIUM CHLORIDE CRYS ER 20 MEQ PO TBCR: 40.0000 meq | EXTENDED_RELEASE_TABLET | Freq: Three times a day (TID) | ORAL | Status: DC

## 2015-08-04 MED ORDER — LOPERAMIDE HCL 2 MG PO TABS: 2.0000 mg | ORAL_TABLET | Freq: Four times a day (QID) | ORAL | Status: DC | PRN

## 2015-08-04 NOTE — Progress Notes (Signed)
   Subjective:    Patient ID: [name], female    DOB: 1946/06/14, 69 y.o.   MRN: [id number]  HPI SDA patient with a host of complaints.  Those complaints include. Nausea and decreased appetite.  Gags easily.  Rarely vomits. Hx of low potassium.  Taking 4 potassium pills per day.   Some palpitations.  No SOB, chest pain or lighheadedness 6-7 BMs per day.  Some cramping discomfort.   Depressed.  Seperated x 1.5 year.  Initially improving, now getting worse.  No SI or HI.   Hx of back pain.  Having rare "back spasms.  I clustered the symptoms around GI.  No pain.  Has both upper and lower GI symptoms.  Did have colonoscopy in 2008 and had lymphocytic colitis by biopsy.    Review of Systems     Objective:   Physical Examlungs clear Cardiac RRR without m or g Abd benign without organomegally Ext no edema.        Assessment & Plan:

## 2015-08-04 NOTE — Assessment & Plan Note (Signed)
See diarrhea:  One possible treatment algorithm is as follows: First line: Loperamide (Imodium AD) or diphoxylate/atropine (Lomotil) for mild diarrhea. Second line: Bismuth subsalicylate, two or three 262 mg tab tid or qid for 1-2 months (effective in up to 90% of patients); mesalamine, 3 g/d for 8 wk; or cholestyramine (especially if bile acid malabsorption is documented), at a mean dose of 8 g/d in moderate disease. Third line: If patient is still not responding or if a patient has clinically more severe colitis, a 6-week course of budesonide at the lowest effective dosage (usually 9 mg each morning) or a 2-week course of high-dose prednisone (60-80 mg/d) before tapering can be prescribed. Longer courses of budesonide may be beneficial, and, while systemic adverse effects may occur, little or no adrenal suppression should be anticipated. Recurrences after discontinuation of budesonide usually respond to reinstitution of the same medication. Longer courses of prednisone (up to 2 mo before tapering) may be needed in some patients, but recurrence is common after its discontinuation. In a randomized, double-blind, placebo-controlled study, Miehlke et al evaluated treatment of lymphocytic colitis with oral budesonide. [14] At week 6, remission was documented by colonoscopy and histology in 86% of the budesonide group compared with 48% of those administered placebo ( P = 0.01). Histologic remission was confirmed in 73% of those receiving budesonide and 31% of patients administered placebo ( P = 0.03). [14] Relapse during follow-up was evident in about 44% (15 patients), but 8 of those who had a relapse again had a response to budesonide. Clinical remission and improved histology is achieved in a majority of patients with lymphocytic colitis when treated with budesonide. [14] Fourth line: Some refractory cases may benefit from azathioprine (approximately 2 g/kg/d) or 6-mercaptopurine, but responses often take months  to occur. Methotrexate can alternatively be used in this setting.

## 2015-08-04 NOTE — Assessment & Plan Note (Signed)
Writing assessment after I have seen the labs.  Broad differential Dx.  I am most worried about a potassium losing colitis - perhaps a recurrence of her lymphocytic colitis.  Needs even more potassium.

## 2015-08-04 NOTE — Assessment & Plan Note (Signed)
Profound hypokalemia despite treatment.  Will increase K and slow losses by adding immodium.

## 2015-08-04 NOTE — Assessment & Plan Note (Signed)
Concerned about recurrence of lymphocytic colitis.  Treatment: One possible treatment algorithm is as follows: First line: Loperamide (Imodium AD) or diphoxylate/atropine (Lomotil) for mild diarrhea. Second line: Bismuth subsalicylate, two or three 262 mg tab tid or qid for 1-2 months (effective in up to 90% of patients); mesalamine, 3 g/d for 8 wk; or cholestyramine (especially if bile acid malabsorption is documented), at a mean dose of 8 g/d in moderate disease. Third line: If patient is still not responding or if a patient has clinically more severe colitis, a 6-week course of budesonide at the lowest effective dosage (usually 9 mg each morning) or a 2-week course of high-dose prednisone (60-80 mg/d) before tapering can be prescribed. Longer courses of budesonide may be beneficial, and, while systemic adverse effects may occur, little or no adrenal suppression should be anticipated. Recurrences after discontinuation of budesonide usually respond to reinstitution of the same medication. Longer courses of prednisone (up to 2 mo before tapering) may be needed in some patients, but recurrence is common after its discontinuation. In a randomized, double-blind, placebo-controlled study, Miehlke et al evaluated treatment of lymphocytic colitis with oral budesonide. [14] At week 6, remission was documented by colonoscopy and histology in 86% of the budesonide group compared with 48% of those administered placebo ( P = 0.01). Histologic remission was confirmed in 73% of those receiving budesonide and 31% of patients administered placebo ( P = 0.03). [14] Relapse during follow-up was evident in about 44% (15 patients), but 8 of those who had a relapse again had a response to budesonide. Clinical remission and improved histology is achieved in a majority of patients with lymphocytic colitis when treated with budesonide. [14] Fourth line: Some refractory cases may benefit from azathioprine (approximately 2 g/kg/d) or  6-mercaptopurine, but responses often take months to occur. Methotrexate can alternatively be used in this setting.

## 2015-08-04 NOTE — Addendum Note (Signed)
Addended by: Moses MannersHENSEL, WILLIAM A on: 08/04/2015 04:56 PM   Modules accepted: Orders

## 2015-08-04 NOTE — Assessment & Plan Note (Signed)
I doubt primary depression or IBS.  Seems more likely that she has combo of medical problem (diarrhea) and grief over loss of relationship.

## 2015-08-08 ENCOUNTER — Other Ambulatory Visit: Payer: Self-pay | Admitting: Family Medicine

## 2015-08-08 ENCOUNTER — Telehealth: Payer: Self-pay | Admitting: Family Medicine

## 2015-08-08 DIAGNOSIS — E876 Hypokalemia: Secondary | ICD-10-CM

## 2015-08-08 NOTE — Telephone Encounter (Signed)
Patient calls, requesting to speak to Dr. Leveda AnnaHensel pertaining to appt with him on 08/03/15.

## 2015-08-09 NOTE — Telephone Encounter (Signed)
I spoke with Ms [name]. Her question was about date of appointment with me. We scheduled an appointment with me for 1:30 pm on Thursday 12/1.   I will ask the staff to put this app't into EPIC  [facility] [name] BMET and serum Magnesium to follow up her hypokalemia.

## 2015-08-09 NOTE — Telephone Encounter (Signed)
Can you double book pt on Geri clinic on 08/18/15 @1 :30pm per Dr. McDiarmid? Chasya Zenz, CMA.

## 2015-08-16 ENCOUNTER — Other Ambulatory Visit: Payer: Medicare Other

## 2015-08-16 DIAGNOSIS — E876 Hypokalemia: Secondary | ICD-10-CM | POA: Diagnosis not present

## 2015-08-16 LAB — BASIC METABOLIC PANEL
BUN: 4 mg/dL — ABNORMAL LOW (ref 7–25)
CO2: 24 mmol/L (ref 20–31)
Calcium: 8.3 mg/dL — ABNORMAL LOW (ref 8.6–10.4)
Chloride: 111 mmol/L — ABNORMAL HIGH (ref 98–110)
Creat: 0.54 mg/dL — ABNORMAL LOW (ref 0.60–0.93)
Glucose, Bld: 83 mg/dL (ref 65–99)
Potassium: 3.8 mmol/L (ref 3.5–5.3)
Sodium: 141 mmol/L (ref 135–146)

## 2015-08-16 LAB — MAGNESIUM: Magnesium: 1.7 mg/dL (ref 1.5–2.5)

## 2015-08-16 NOTE — Telephone Encounter (Signed)
There is no geri clinic on dec 1.  She has kept her appt on dec 8.  Please advise

## 2015-08-16 NOTE — Progress Notes (Signed)
[facility]

## 2015-08-16 NOTE — Telephone Encounter (Signed)
Will forward to MD.  PCP has his continuity clinic in the morning of 08-18-15 but no GERI.  Please advise. Jazmin Hartsell,CMA

## 2015-08-17 NOTE — Telephone Encounter (Signed)
Please schedule [name] for a 1:30 SDA appointment for 08/18/15.  Request that nursing page McDiarmid to see patient when she is in exam room.  Please notify [name] of this time and date of this SDA appointment.

## 2015-08-17 NOTE — Telephone Encounter (Signed)
Scheduled on SDA with Dr. Jimmey RalphParker and will page when patient arrives.  She is also aware of this. Marycarmen Hagey,CMA

## 2015-08-18 ENCOUNTER — Encounter: Payer: Self-pay | Admitting: Family Medicine

## 2015-08-18 ENCOUNTER — Ambulatory Visit (INDEPENDENT_AMBULATORY_CARE_PROVIDER_SITE_OTHER): Payer: Medicare Other | Admitting: Family Medicine

## 2015-08-18 VITALS — BP 138/72 | HR 72 | Temp 98.6°F | Ht 64.0 in | Wt 173.0 lb

## 2015-08-18 DIAGNOSIS — K52832 Lymphocytic colitis: Secondary | ICD-10-CM | POA: Diagnosis not present

## 2015-08-18 DIAGNOSIS — R600 Localized edema: Secondary | ICD-10-CM

## 2015-08-18 DIAGNOSIS — E876 Hypokalemia: Secondary | ICD-10-CM

## 2015-08-18 NOTE — Patient Instructions (Signed)
Your potassium level is back in the normal range. I think you lost all that potassium from the increased watery diarrhea.  Diarrhea has a lot of potassium in it.   I recommend that you return to taking one potassium tablet twice a day.   [facility]  Next Wednesday or Thursday to have your potassium recheck to make sure you can keep your levels up on just two potassium pills a day.   Since some statin cholesterol lowering medications have been associated with Lymphocytic Colitis.  Red Yeast Rice is a kind of naturally occuring statin medication.  It is worth a try of stopping the Red Yeast Rice for 3 weeks to see if it decreases your diarrhea.  If it does not, then restart the Red Yeast Rice.  If the diarrhea decreases off the Red Yeast Rice, then I recommend you continue off of it.

## 2015-08-19 ENCOUNTER — Encounter: Payer: Self-pay | Admitting: Family Medicine

## 2015-08-19 DIAGNOSIS — R600 Localized edema: Secondary | ICD-10-CM | POA: Insufficient documentation

## 2015-08-19 NOTE — Progress Notes (Signed)
   Subjective:    Patient ID: [name], female    DOB: 04/11/1946, 69 y.o.   MRN: [id number]  HPI  Hypokalemia - Found in wu for weight loss on 11/16.  Treated with 60 mEq of Kcl supplement bid for about week by Dr Leveda AnnaHensel - deficit thought secondary to prolonged diarrheal episodes - Feels better, diarrhea back down to baseline of about 5 times a day.  Has been like that since diagnosis of lymphocytic colitis in 2007.  - No dizziness, no syncope, no muscular wekaness  Chronic Diarreal Illness - Worsened in September - having more than 10 watery bowel movements a day.  - Pt consulted GI physician in September, though she does not report any specific intervention for diarrhea prescribed.  Pt was told to take her H2B.  - Dr Leveda Annahensel saw patient on 08/02/14 treated with immodium which has decreased frequency of diarrheal episodes back to baseline 5 /day.  More formed now, lwess watery.   Bilateral ankle edema - new problem - Noticed in last two seeka, - No leg injury - No hx of HF, renal failure, malnutrition, liver disease, venous insufficiency,  - Albumin 08/03/15 = 3.3 low with normal lfts, low creatinine,  - Anemia hgb 10.7 11/16 with MCV 91 and RDW 13.9and slightly elevated Plt. Suspect decline secondary to recent poor eating and inflammation from colitis (?lymphocytic).  - TSH 0.471 WNL 08/03/15   Review of Systems     Objective:   Physical Exam Weight: 173 lb (78.472 kg)   Temp Readings from Last 3 Encounters:  08/18/15 98.6 F (37 C) Oral  08/03/15 98.1 F (36.7 C) Oral  06/13/15 97.9 F (36.6 C) Oral   BP Readings from Last 3 Encounters:  08/18/15 138/72  08/03/15 153/56  06/13/15 134/97   Pulse Readings from Last 3 Encounters:  08/18/15 72  08/03/15 70  06/13/15 77    General: in no apparent distress, well developed and well nourished, non-toxic, normal vitals and well hydrated Eyes: no periorbital edema Neck: no adenopathy, no carotid bruit, no JVD,  supple, symmetrical, trachea midline and thyroid not enlarged, symmetric, no tenderness/mass/nodules; Thyroid - normal to inspection and palpation Respiratory: Normal expansion.  Clear to auscultation.  No rales, rhonchi, or wheezing. Cardiovascular: Heart sounds are normal.  Regular rate and rhythm without murmur, gallop or rub.; Pulses: peripheral pulses symmetrical; Legs: trace to 1+ bilateral pedal edema,  There is no sign of venous stasis disease., Leg skin normal exam; no erythema, swelling or tenderness Psych: oriented to person, place, time/date and situation,appropriate, normal insight and judgment, anxious           Assessment & Plan:

## 2015-08-19 NOTE — Assessment & Plan Note (Signed)
Established problem that has improved.  Diarrhea down to baseline of formed to semiformed stool 5 times a day.  Continue Imodium as needed.  Discussed possible role of the Statin-like herbal medication Red Yeast Rice (Takes for Hypercholesterolemia, LDL > 190) in relapse of lymphocytic colitis. Recommended trial off Red Yeast Rice for 3 weeks to see if diarrhea frequency improves.  If diarrhea is not controlled adeqautely with Imodium, will likely recommend a trial of oral Budesonide.

## 2015-08-19 NOTE — Assessment & Plan Note (Signed)
Basic Metabolic Panel:    Component Value Date/Time   NA 141 08/16/2015 0925   K 3.8 08/16/2015 0925   CL 111* 08/16/2015 0925   CO2 24 08/16/2015 0925   BUN 4* 08/16/2015 0925   CREATININE 0.54* 08/16/2015 0925   CREATININE 0.89 06/08/2015 2222   GLUCOSE 83 08/16/2015 0925   CALCIUM 8.3* 08/16/2015 0925    Improved Patient to return to KCl 20 mEq per orl bid, [facility] to [facility] next week for bmet test to see if 40 mEq daily of KCl will be adequate.

## 2015-08-19 NOTE — Assessment & Plan Note (Signed)
New problem Leg Edema - Ddx:  Venous insufficiency,constrictive garments, Obstructive sleep apnea (resulting in pulmonary hypertension), Hypoalbuminemia,  - Treatment recommendations: elevation of involved area, low-salt diet, decrease sodium in the diet, elevate feet above the level of the heart whenever possible and increase physical activity OTC support hose - Diagnostic and Monitoring therapy effectiveness and adverse effects plans: LABS: CMET CBC  - Contact office if worsens or if develop sob or doe or orthopnea.

## 2015-08-25 ENCOUNTER — Encounter: Payer: Medicare Other | Admitting: Family Medicine

## 2015-08-25 ENCOUNTER — Other Ambulatory Visit: Payer: Medicare Other

## 2015-08-26 ENCOUNTER — Other Ambulatory Visit: Payer: Medicare Other

## 2015-08-26 DIAGNOSIS — E876 Hypokalemia: Secondary | ICD-10-CM

## 2015-08-26 LAB — COMPREHENSIVE METABOLIC PANEL
ALT: 21 U/L (ref 6–29)
AST: 22 U/L (ref 10–35)
Albumin: 3.4 g/dL — ABNORMAL LOW (ref 3.6–5.1)
Alkaline Phosphatase: 87 U/L (ref 33–130)
BUN: 10 mg/dL (ref 7–25)
CO2: 25 mmol/L (ref 20–31)
Calcium: 9.3 mg/dL (ref 8.6–10.4)
Chloride: 104 mmol/L (ref 98–110)
Creat: 0.59 mg/dL — ABNORMAL LOW (ref 0.60–0.93)
Glucose, Bld: 92 mg/dL (ref 65–99)
Potassium: 5 mmol/L (ref 3.5–5.3)
Sodium: 138 mmol/L (ref 135–146)
Total Bilirubin: 0.4 mg/dL (ref 0.2–1.2)
Total Protein: 6.3 g/dL (ref 6.1–8.1)

## 2015-08-26 NOTE — Progress Notes (Signed)
cmp done today [name] 

## 2015-08-29 ENCOUNTER — Telehealth: Payer: Self-pay | Admitting: Family Medicine

## 2015-08-29 ENCOUNTER — Encounter: Payer: Self-pay | Admitting: Family Medicine

## 2015-08-29 NOTE — Telephone Encounter (Signed)
Pt called back and asked that the doctor call her after lunchtime today at [phone]. She has to go buy minutes for this number. jw

## 2015-08-29 NOTE — Telephone Encounter (Signed)
Contact numbers disconnected

## 2015-08-29 NOTE — Telephone Encounter (Signed)
Please let [name] know that her lab work from Friday, 12/9 looked good.  Her potassium is better.  I have sent her a letter with her lab results and my recommendations.  She should have it in a couple days.

## 2015-08-30 NOTE — Telephone Encounter (Signed)
Tried to call patient but no vm set up.  Jazmin Hartsell,CMA

## 2015-08-30 NOTE — Telephone Encounter (Signed)
Tried calling patient again no VM set up. Misty Rago,CMA

## 2015-09-01 ENCOUNTER — Ambulatory Visit: Payer: Medicare Other

## 2015-09-08 ENCOUNTER — Telehealth: Payer: Self-pay | Admitting: Family Medicine

## 2015-09-08 NOTE — Telephone Encounter (Signed)
[name] reports that diarrhea is about three times a day since stopping Red Yeast Rice. This frequency of diarrhea is less than what she reported last month before stopping the Red Yeast Rice.  Additionally, she feels that the frequency of stools has decreased as well since stopping the supplement.   She will try a trial of restarting her Red Yeast Rice for her very high LDL cholesterol, but take it only once a day, rather than the twice day she was taking before she stopped the supplement.  She mentions a tremor in her dominant hand that she notices with writing, but not at rest.  No tremors in other hand nor in feet.  No falls.   I suspect this is an essential tremor, not evidence of a parkinsonian process.   Advised to come in to see physician if tremor worsens or starts to appear at rest.

## 2015-09-08 NOTE — Telephone Encounter (Signed)
Spoke to patient but she states that Dr. McDiarmid advised her at their last appt to call her back in 3 weeks after stopping her "rice yeast".  Patient didn't give any more details since her provider would know what she was talking about. Darrly Loberg,CMA

## 2015-09-08 NOTE — Telephone Encounter (Signed)
Pt called and would like to speak to Dr. McDiarmid about her diarrhea. Myriam Jacobsonjw

## 2015-10-31 ENCOUNTER — Other Ambulatory Visit: Payer: Self-pay | Admitting: Family Medicine

## 2015-10-31 DIAGNOSIS — H2513 Age-related nuclear cataract, bilateral: Secondary | ICD-10-CM | POA: Diagnosis not present

## 2015-10-31 DIAGNOSIS — H25013 Cortical age-related cataract, bilateral: Secondary | ICD-10-CM | POA: Diagnosis not present

## 2015-11-01 ENCOUNTER — Telehealth: Payer: Self-pay | Admitting: Family Medicine

## 2015-11-01 NOTE — Telephone Encounter (Signed)
Will forward to MD to advise. Jazmin Hartsell,CMA  

## 2015-11-01 NOTE — Telephone Encounter (Signed)
Flutter some Dr Joni Reining

## 2015-11-01 NOTE — Telephone Encounter (Signed)
I spoke with patient by phone She is experiencing more heart flutter sensation than in past.  She denies pain, dyspnea, decreased consciousness, exertional symptoms.  She has made an appointment to see Dr Verdis Prime (Card) on 11/03/15 about flutter sensation.  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  I offered for Ms Hotard to come in to [facility] for SDA prior to 2/16 ov with cardiology.  She declined this, stating that she could wait until she sees Dr Katrinka Blazing.  Advised Ms Maggie Font to contact us if she feels her condition is worsening and cannot wait until 2/16.  She agreed.

## 2015-11-01 NOTE — Telephone Encounter (Signed)
Pt called and would like to have her blood drawn to see how her potassium is. She is concerned about how she is feeling. jw

## 2015-11-03 ENCOUNTER — Ambulatory Visit (INDEPENDENT_AMBULATORY_CARE_PROVIDER_SITE_OTHER): Payer: Medicare Other | Admitting: Interventional Cardiology

## 2015-11-03 ENCOUNTER — Encounter: Payer: Self-pay | Admitting: Interventional Cardiology

## 2015-11-03 VITALS — BP 140/70 | HR 66 | Ht 64.0 in | Wt 179.0 lb

## 2015-11-03 DIAGNOSIS — E785 Hyperlipidemia, unspecified: Secondary | ICD-10-CM

## 2015-11-03 DIAGNOSIS — Z01810 Encounter for preprocedural cardiovascular examination: Secondary | ICD-10-CM

## 2015-11-03 DIAGNOSIS — I493 Ventricular premature depolarization: Secondary | ICD-10-CM

## 2015-11-03 DIAGNOSIS — I2510 Atherosclerotic heart disease of native coronary artery without angina pectoris: Secondary | ICD-10-CM | POA: Diagnosis not present

## 2015-11-03 DIAGNOSIS — I2584 Coronary atherosclerosis due to calcified coronary lesion: Secondary | ICD-10-CM | POA: Insufficient documentation

## 2015-11-03 DIAGNOSIS — E668 Other obesity: Secondary | ICD-10-CM

## 2015-11-03 DIAGNOSIS — I10 Essential (primary) hypertension: Secondary | ICD-10-CM

## 2015-11-03 HISTORY — DX: Atherosclerotic heart disease of native coronary artery without angina pectoris: I25.10

## 2015-11-03 LAB — COMPREHENSIVE METABOLIC PANEL
ALT: 14 U/L (ref 6–29)
AST: 20 U/L (ref 10–35)
Albumin: 3.5 g/dL — ABNORMAL LOW (ref 3.6–5.1)
Alkaline Phosphatase: 83 U/L (ref 33–130)
BUN: 8 mg/dL (ref 7–25)
CO2: 25 mmol/L (ref 20–31)
Calcium: 8.9 mg/dL (ref 8.6–10.4)
Chloride: 109 mmol/L (ref 98–110)
Creat: 0.8 mg/dL (ref 0.60–0.93)
Glucose, Bld: 81 mg/dL (ref 65–99)
Potassium: 3.1 mmol/L — ABNORMAL LOW (ref 3.5–5.3)
Sodium: 140 mmol/L (ref 135–146)
Total Bilirubin: 0.4 mg/dL (ref 0.2–1.2)
Total Protein: 6.6 g/dL (ref 6.1–8.1)

## 2015-11-03 LAB — LIPID PANEL
Cholesterol: 244 mg/dL — ABNORMAL HIGH (ref 125–200)
HDL: 59 mg/dL (ref >=46)
LDL Cholesterol: 174 mg/dL — ABNORMAL HIGH (ref <130)
Total CHOL/HDL Ratio: 4.1 Ratio (ref <=5.0)
Triglycerides: 54 mg/dL (ref <150)
VLDL: 11 mg/dL (ref <30)

## 2015-11-03 MED ORDER — ATENOLOL 50 MG PO TABS: 50.0000 mg | ORAL_TABLET | Freq: Every day | ORAL | Status: DC

## 2015-11-03 NOTE — Progress Notes (Signed)
Cardiology Office Note   Date:  11/03/2015   ID:  ABIR EROH, DOB 22-May-1946, MRN [id number]  PCP:  Acquanetta Sit, MD  Cardiologist:  Sinclair Grooms, MD   Chief Complaint  Patient presents with  . Palpitations  . Pre-op Exam      History of Present Illness: [name] is a 70 y.o. female who presents for Palpitations, PVC's, hyperlipidemia, and hypertension. Three-vessel coronary calcification identified on CT scan done for purposes other than cardiac evaluation in 2014.  Still having significant palpitations. They frighten her when they occur. Not so prevalent when she is active. More noticeable when at rest or trying to fall to sleep. Also can hear heart beat in her occipital region and in her years.  She has an upcoming ophthalmologic surgical procedure with Dr. Gershon Crane and requests clearance.  Past Medical History  Diagnosis Date  . Anemia   . Irregular heart beat   . 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 History of pneumonia 05/17/2011  . SINUSITIS 11/17/2009    Qualifier: History of  By: McDiarmid MD, Sherren Mocha    . HIATAL HERNIA 07/08/1997    Annotation: Dx on abdominal ultrasound Qualifier: Diagnosis of  By: McDiarmid MD, Sherren Mocha    . Premature ventricular contractions 11/17/2009    Qualifier: Diagnosis of  By: McDiarmid MD, Sherren Mocha    . 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 GASTROESOPHAGEAL REFLUX DISEASE 11/17/2009    Qualifier: Diagnosis of  By: McDiarmid MD, Sherren Mocha    . TROCHANTERIC BURSITIS, RIGHT 11/17/2009    Qualifier: History of  By: McDiarmid MD, Sherren Mocha    . TRIGGER FINGER, RIGHT THUMB 05/09/2001    Qualifier: History of  By: McDiarmid MD, Sherren Mocha    . Right knee pain 04/29/2014  . Right ankle swelling 04/06/2015  . Personal history of other disorders of nervous system and sense organs 11/17/2009    Centricity Description: BENIGN POSITIONAL VERTIGO, HX OF Qualifier: Diagnosis of  By: McDiarmid MD, Sherren Mocha   Centricity Description: CARPAL TUNNEL SYNDROME, BILATERAL, HX OF  Qualifier: History of  By: McDiarmid MD, Sherren Mocha    . Paresthesia of lower limb 01/16/2011  . OPHTHALMIC MIGRAINES, HX OF 06/08/2008    Qualifier: Diagnosis of  By: McDiarmid MD, Sherren Mocha    . Moderate obesity 11/17/2009    Qualifier: Diagnosis of  By: McDiarmid MD, Sherren Mocha    . Jaw pain 10/28/2014  . HYPOKALEMIA, HX OF 11/17/2009    Presumed secondary to lymphocytic colitis.    . Hyperlipidemia 09/24/2014  . History of pneumonia 05/17/2011    Infiltrate developed on CXR with fever during hospitalization 05/15/11 for N/V secondary to to Eosinophilic gastroenteritis.  Suspect Aspiration pneumonitis.    Marland Kitchen History of colon polyps 11/20/2010    Dx in 2008 by Dr Juanita Craver.    Marland Kitchen DEPRESSION, HX OF 11/17/2009    Qualifier: Diagnosis of  By: McDiarmid MD, Sherren Mocha    . ANEMIA, MILD, HX OF 11/21/2009    Qualifier: Diagnosis of  By: McDiarmid MD, Sherren Mocha    . ANAL FISSURE, HX OF 11/17/2009    Qualifier: Diagnosis of  By: McDiarmid MD, Sherren Mocha    . Adjustment disorder with mixed anxiety and depressed mood 10/28/2014  . 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 Osteopenia 12/14/2014  T socre lumbar in 2015 = (-) 1.8. FRAX 5% major fracture 10 year risk.  0.5% 10 year risk hip fracture.      Past Surgical History  Procedure Laterality Date  . Abdominal hysterectomy  1988    partial  . Hernia repair      umbilical, repaired twice.   . Eye surgery      terygium  . Breast surgery      extraction  . Appendectomy    . Cardiac catheterization  12/2006    Dr Viona Gilmore. Mel[facility] Banker. No obstruction.  Normal LV  . Colonoscopy w/ biopsies  09/2006    Dx withDr Verdia Kuba (GI): Dx with  Lymphocytic Colitis on biopsies. No polyps or masses.   . Trigger finger release      Release of stenosising tenosynovitis of thumbs  . Carpal tunnel release       bilateral wrists.   . Gum surgery    . Tonsillectomy    . Tubal ligation  1975  . Dilation and curettage of uterus  1988  . Biopsy breast  09/2001  . Ultrasound of gall bladder with cck-injection  1999    normal  . Ugi barium  1999    normal     Current Outpatient Prescriptions  Medication Sig Dispense Refill  . aspirin 81 MG tablet Take 1 tablet (81 mg total) by mouth daily. 30 tablet   . atenolol (TENORMIN) 50 MG tablet Take 1 tablet (50 mg total) by mouth daily. 90 tablet 3  . Calcium Carb-Cholecalciferol (CALCIUM 600 + D PO) Take 1 tablet by mouth daily.     . Coenzyme Q10 (COQ-10) 50 MG CAPS Take 1 capsule by mouth 2 (two) times daily.    . COLLAGEN PO Take 1 capsule by mouth daily.     Marland Kitchen EVENING PRIMROSE OIL PO Take 1 capsule by mouth daily.    . famotidine-calcium carbonate-magnesium hydroxide (PEPCID COMPLETE) 10-800-165 MG CHEW chewable tablet Chew 1 tablet by mouth daily as needed (heartburn/acid reflux).     Marland Kitchen loperamide (IMODIUM A-D) 2 MG tablet Take 1 tablet (2 mg total) by mouth 4 (four) times daily as needed for diarrhea or loose stools. 30 tablet 0  . Misc Natural Products (GRAPE SEED COMPLEX PO) Take 1 capsule by mouth 2 (two) times daily.    . Multiple Vitamin (MULTIVITAMIN WITH MINERALS) TABS tablet Take 1 tablet by mouth daily.    . Omega-3 Fatty Acids (FISH OIL PO) Take 1 capsule by mouth 2 (two) times daily.     . potassium chloride SA (K-DUR,KLOR-CON) 20 MEQ tablet Take 40 mEq by mouth 2 (two) times daily.    . Probiotic Product (PROBIOTIC DAILY PO) Take 1 tablet by mouth daily.    . Red Yeast Rice 600 MG CAPS Take 1 capsule by mouth 2 (two) times daily.      No current facility-administered medications for this visit.    Allergies:   Cefuroxime; Azithromycin; Contrast media; Oxycodone; Phenergan; Shellfish allergy; Statins; Amoxicillin; and Tramadol    Social History:  The patient  reports that she has never smoked. She has never used smokeless tobacco. She  reports that she drinks about 0.5 oz of alcohol per week. She reports that she does not use illicit drugs.   Family History:  The patient's family history includes Cancer in her mother; Hearing loss in her mother; Heart disease in her father; Stroke in her brother and father.    ROS:  Please see the  history of present illness.   Otherwise, review of systems are positive for pressure and dizziness diarrhea abdominal discomfort leg swelling and occasional chest pain.   All other systems are reviewed and negative.    PHYSICAL EXAM: VS:  BP 140/70 mmHg  Pulse 66  Ht _0  (1.626 m)  Wt 179 lb (81.194 kg)  BMI 30.71 kg/m2 , BMI Body mass index is 30.71 kg/(m^2). GEN: Well nourished, well developed, in no acute distress HEENT: normal Neck: no JVD, carotid bruits, or masses Cardiac: RRR.  There is no murmur, rub, or gallop. There is no edema. Respiratory:  clear to auscultation bilaterally, normal work of breathing. GI: soft, nontender, nondistended, + BS MS: no deformity or atrophy Skin: warm and dry, no rash Neuro:  Strength and sensation are intact Psych: euthymic mood, full affect   EKG:  EKG is  ordered today. The ekg reveals normal sinus rhythm with occasional PVC otherwise normal appearance   Recent Labs: 08/03/2015: Hemoglobin 10.7*; Platelets 408*; TSH 0.471 08/16/2015: Magnesium 1.7 08/26/2015: ALT 21; BUN 10; Creat 0.59*; Potassium 5.0; Sodium 138    Lipid Panel    Component Value Date/Time   CHOL 250* 05/26/2015 1200   TRIG 92 05/26/2015 1200   HDL 56 05/26/2015 1200   CHOLHDL 4.5 05/26/2015 1200   VLDL 18 05/26/2015 1200   LDLCALC 176* 05/26/2015 1200   LDLDIRECT 119 06/21/2015 0947      Wt Readings from Last 3 Encounters:  11/03/15 179 lb (81.194 kg)  08/18/15 173 lb (78.472 kg)  08/03/15 170 lb 9.6 oz (77.384 kg)      Other studies Reviewed: Additional studies/ records that were reviewed today include: Reviewed prior imaging studies. The findings  include identify coronary and abdominal aortic calcification.    ASSESSMENT AND PLAN:  1. Premature ventricular contractions Uniform PVCs causing symptomatic palpitations  2. Hyperlipidemia Relatively high HDL with severe elevation in LDL greater than 160.  3.Pre-operative CV exam Cleared for upcoming ophthalmologic surgery by Dr. Michel Santee  4. Coronary Artery Calcification on CT 2014 CT of the pelvis demonstrated three-vessel coronary calcification  5. Hypertension essential Borderline control  Current medicines are reviewed at length with the patient today.  The patient has the following concerns regarding medicines: None.  The following changes/actions have been instituted:    BMET and magnesium  Lipid panel and liver panel  Increase atenolol to 50 mg per day  Cleared for upcoming ophthalmologic surgery by Dr. Michel Santee  Monitor blood pressure closely  81 mg aspirin daily  Labs/ tests ordered today include:   Orders Placed This Encounter  Procedures  . Lipid panel  . Comp Met (CMET)  . EKG 12-Lead     Disposition:   FU with HS in 1 years  Signed, Sinclair Grooms, MD  11/03/2015 9:47 AM    [address], [address] [place], [place]  [address] Phone: [phone]; Fax: [phone]

## 2015-11-03 NOTE — Patient Instructions (Signed)
Medication Instructions:  Your physician has recommended you make the following change in your medication:  INCREASE Atenolol to  daily. An Rx has been sent to your pharmacy   Labwork: Lipid and Cmet today  Testing/Procedures: None ordered  Follow-Up: Your physician wants you to follow-up in: 1 year with Dr.Smith You will receive a reminder letter in the mail two months in advance. If you don't receive a letter, please call our office to schedule the follow-up appointment.   Any Other Special Instructions Will Be Listed Below (If Applicable).     If you need a refill on your cardiac medications before your next appointment, please call your pharmacy.

## 2015-11-04 ENCOUNTER — Other Ambulatory Visit: Payer: Self-pay | Admitting: *Deleted

## 2015-11-04 DIAGNOSIS — E876 Hypokalemia: Secondary | ICD-10-CM

## 2015-11-09 DIAGNOSIS — H2511 Age-related nuclear cataract, right eye: Secondary | ICD-10-CM | POA: Diagnosis not present

## 2015-11-11 ENCOUNTER — Other Ambulatory Visit: Payer: Self-pay | Admitting: *Deleted

## 2015-11-11 ENCOUNTER — Other Ambulatory Visit (INDEPENDENT_AMBULATORY_CARE_PROVIDER_SITE_OTHER): Payer: Medicare Other | Admitting: *Deleted

## 2015-11-11 DIAGNOSIS — E876 Hypokalemia: Secondary | ICD-10-CM

## 2015-11-11 DIAGNOSIS — I10 Essential (primary) hypertension: Secondary | ICD-10-CM

## 2015-11-11 LAB — BASIC METABOLIC PANEL
BUN: 8 mg/dL (ref 7–25)
CO2: 24 mmol/L (ref 20–31)
Calcium: 8.7 mg/dL (ref 8.6–10.4)
Chloride: 108 mmol/L (ref 98–110)
Creat: 0.66 mg/dL (ref 0.60–0.93)
Glucose, Bld: 83 mg/dL (ref 65–99)
Potassium: 3.4 mmol/L — ABNORMAL LOW (ref 3.5–5.3)
Sodium: 142 mmol/L (ref 135–146)

## 2015-11-11 NOTE — Addendum Note (Signed)
Addended by: BOWDEN, ROBIN K on: 11/11/2015 08:58 AM   Modules accepted: Orders  

## 2015-11-15 ENCOUNTER — Telehealth: Payer: Self-pay | Admitting: Interventional Cardiology

## 2015-11-15 DIAGNOSIS — E876 Hypokalemia: Secondary | ICD-10-CM

## 2015-11-15 NOTE — Telephone Encounter (Signed)
Pt aware of lab results and Dr.Smith' recommendation. Increase potassium to 20 mEq twice a day. bmet to be repeated in 10days. Pt sts that she is currently taking potassium bid. Spoke with Janell Quiet who recommends pt increase potassium to in the am and in the pm. Pt will have repeat bmet on 3/9. Pt aware of instructions given and verbalized understanding.

## 2015-11-15 NOTE — Telephone Encounter (Signed)
-----   Message from Lyn Records, MD sent at 11/11/2015  4:31 PM EST ----- Increase potassium to 20 mEq twice a day.  Repeat a sig metabolic panel in 10 days.

## 2015-11-15 NOTE — Telephone Encounter (Signed)
Pt would like her lab results from Friday(11-11-15)please.

## 2015-11-24 ENCOUNTER — Other Ambulatory Visit (INDEPENDENT_AMBULATORY_CARE_PROVIDER_SITE_OTHER): Payer: Medicare Other | Admitting: *Deleted

## 2015-11-24 DIAGNOSIS — E876 Hypokalemia: Secondary | ICD-10-CM

## 2015-11-24 DIAGNOSIS — I10 Essential (primary) hypertension: Secondary | ICD-10-CM

## 2015-11-24 LAB — BASIC METABOLIC PANEL
BUN: 8 mg/dL (ref 7–25)
CO2: 25 mmol/L (ref 20–31)
Calcium: 9 mg/dL (ref 8.6–10.4)
Chloride: 110 mmol/L (ref 98–110)
Creat: 0.59 mg/dL — ABNORMAL LOW (ref 0.60–0.93)
Glucose, Bld: 81 mg/dL (ref 65–99)
Potassium: 3.9 mmol/L (ref 3.5–5.3)
Sodium: 143 mmol/L (ref 135–146)

## 2015-11-24 NOTE — Addendum Note (Signed)
Addended by: Tonita PhoenixBOWDEN, Julion Gatt K on: 11/24/2015 08:09 AM   Modules accepted: Orders

## 2015-11-28 ENCOUNTER — Telehealth: Payer: Self-pay | Admitting: Family Medicine

## 2015-11-28 NOTE — Telephone Encounter (Signed)
Informed patient that her list is ready for pick up.  Jazmin Hartsell,CMA

## 2015-11-28 NOTE — Telephone Encounter (Signed)
Need a list of medications she is allergic to.  Please contact patient when ready to pick up.  Would like to have today if possible.

## 2015-11-30 DIAGNOSIS — H2511 Age-related nuclear cataract, right eye: Secondary | ICD-10-CM | POA: Diagnosis not present

## 2015-12-07 ENCOUNTER — Other Ambulatory Visit: Payer: Self-pay

## 2015-12-07 DIAGNOSIS — E876 Hypokalemia: Secondary | ICD-10-CM

## 2015-12-26 ENCOUNTER — Other Ambulatory Visit (INDEPENDENT_AMBULATORY_CARE_PROVIDER_SITE_OTHER): Payer: Medicare Other | Admitting: *Deleted

## 2015-12-26 DIAGNOSIS — E876 Hypokalemia: Secondary | ICD-10-CM | POA: Diagnosis not present

## 2015-12-26 LAB — BASIC METABOLIC PANEL
BUN: 9 mg/dL (ref 7–25)
CO2: 27 mmol/L (ref 20–31)
Calcium: 9.7 mg/dL (ref 8.6–10.4)
Chloride: 108 mmol/L (ref 98–110)
Creat: 0.54 mg/dL — ABNORMAL LOW (ref 0.60–0.93)
Glucose, Bld: 94 mg/dL (ref 65–99)
Potassium: 4.1 mmol/L (ref 3.5–5.3)
Sodium: 141 mmol/L (ref 135–146)

## 2016-01-10 DIAGNOSIS — H1851 Endothelial corneal dystrophy: Secondary | ICD-10-CM | POA: Diagnosis not present

## 2016-01-10 DIAGNOSIS — H04123 Dry eye syndrome of bilateral lacrimal glands: Secondary | ICD-10-CM | POA: Diagnosis not present

## 2016-01-10 DIAGNOSIS — H2512 Age-related nuclear cataract, left eye: Secondary | ICD-10-CM | POA: Diagnosis not present

## 2016-01-10 DIAGNOSIS — Z961 Presence of intraocular lens: Secondary | ICD-10-CM | POA: Diagnosis not present

## 2016-01-10 DIAGNOSIS — H35371 Puckering of macula, right eye: Secondary | ICD-10-CM | POA: Diagnosis not present

## 2016-01-26 ENCOUNTER — Other Ambulatory Visit: Payer: Self-pay | Admitting: Family Medicine

## 2016-02-02 DIAGNOSIS — H04123 Dry eye syndrome of bilateral lacrimal glands: Secondary | ICD-10-CM | POA: Diagnosis not present

## 2016-02-02 DIAGNOSIS — H1851 Endothelial corneal dystrophy: Secondary | ICD-10-CM | POA: Diagnosis not present

## 2016-02-02 DIAGNOSIS — Z961 Presence of intraocular lens: Secondary | ICD-10-CM | POA: Diagnosis not present

## 2016-02-02 DIAGNOSIS — H2512 Age-related nuclear cataract, left eye: Secondary | ICD-10-CM | POA: Diagnosis not present

## 2016-03-21 ENCOUNTER — Ambulatory Visit (INDEPENDENT_AMBULATORY_CARE_PROVIDER_SITE_OTHER): Payer: Medicare Other | Admitting: Family Medicine

## 2016-03-21 ENCOUNTER — Encounter: Payer: Self-pay | Admitting: Family Medicine

## 2016-03-21 VITALS — BP 128/61 | HR 66 | Temp 98.4°F | Wt 173.0 lb

## 2016-03-21 DIAGNOSIS — Z8249 Family history of ischemic heart disease and other diseases of the circulatory system: Secondary | ICD-10-CM | POA: Insufficient documentation

## 2016-03-21 DIAGNOSIS — R51 Headache: Secondary | ICD-10-CM

## 2016-03-21 DIAGNOSIS — R519 Headache, unspecified: Secondary | ICD-10-CM

## 2016-03-21 HISTORY — DX: Family history of ischemic heart disease and other diseases of the circulatory system: Z82.49

## 2016-03-21 NOTE — Patient Instructions (Addendum)
Getting another MRI of your head to evaluate for aneurysms  Follow up with Dr. McDiarmid in 1 month, sooner if your headache recurs  Be well, Dr. Pollie MeyerMcIntyre

## 2016-03-21 NOTE — Progress Notes (Signed)
Date of Visit: 03/21/2016   HPI:  Patient presents for a same day appointment to discuss headache.  Had headache for 3-4 days. Located in the front of her head, on and off. However, this morning she woke up without a headache. She contemplated canceling the appointment since the headache resolved, but decided to come on in. She wonders if it was due to stress. Does admit to occasional headaches other times, but unable to be more specific than this. She tried taking aspirin 325mg  for this episode of headache, without relief.  Notes family history of intracranial hemorrhage & aneurysm in multiple family members (grandmother, aunt, 2 brothers, sister). Scared she might have one as well. Unable to recall whether she has ever had imaging to evaluate for aneurysm.   Denies fevers, rashes. She did fall on June 19 as a sidewalk was uneven. Did not hit head. Did not lose consciousness. Only blood thinner is aspirin 81mg  daily.  ROS: See HPI  PMFSH: history of hypertension, eosinophlic gastroenteritis, PVC, osteoarthritis, hyperlipidemia, GERD  PHYSICAL EXAM: BP 128/61 mmHg  Pulse 66  Temp(Src) 98.4 F (36.9 C) (Oral)  Wt 173 lb (78.472 kg) Gen: NAD, pleasant, cooperative HEENT: normocephalic, atraumatic, moist mucous membranes  Heart: regular rate and rhythm, no murmur Lungs: clear to auscultation bilaterally, normal work of breathing  Neuro: cranial nerves II-XII tested and intact. Speech normal. Full strength bilat upper and lower ext. Normal FNF. Negative romberg.  ASSESSMENT/PLAN:  Family history of intracranial aneurysms With strong family history of intracranial aneurysms and patient's anxiety about having one herself, further imaging is warranted Records reviewed - she had MRA back in early August 2016 for the same issue. This was normal. Reviewed available recommendations - per UpToDate, with strong family history they recommend MRA yearly for 3 years, then spacing it out  further Will obtain MR (to rule out parenchymal intracranial process) as well as MRA to further rule out aneurysm.  Patient agreeable to this plan.    FOLLOW UP: Follow up in 1 mo for above issues with PCP  GrenadaBrittany J. Pollie MeyerMcIntyre, MD [facility]

## 2016-03-22 NOTE — Assessment & Plan Note (Signed)
With strong family history of intracranial aneurysms and patient's anxiety about having one herself, further imaging is warranted Records reviewed - she had MRA back in early August 2016 for the same issue. This was normal. Reviewed available recommendations - per UpToDate, with strong family history they recommend MRA yearly for 3 years, then spacing it out further Will obtain MR (to rule out parenchymal intracranial process) as well as MRA to further rule out aneurysm.  Patient agreeable to this plan.

## 2016-03-28 ENCOUNTER — Telehealth: Payer: Self-pay | Admitting: *Deleted

## 2016-03-28 NOTE — Telephone Encounter (Signed)
[facility] called and pt has an imaging study scheduled for tomorrow.  Per Radiology tech Pt needs to have a MRI of brain without contrast ordered as well. Kirra Verga, Maryjo RochesterJessica Dawn, CMA

## 2016-03-28 NOTE — Telephone Encounter (Signed)
Ok to give verbal orders? Page, cma.

## 2016-03-29 ENCOUNTER — Ambulatory Visit (HOSPITAL_COMMUNITY)
Admission: RE | Admit: 2016-03-29 | Discharge: 2016-03-29 | Disposition: A | Discharge status: Home / Self Care | Payer: Medicare Other | Source: Ambulatory Visit | Attending: Family Medicine | Admitting: Family Medicine

## 2016-03-29 DIAGNOSIS — R519 Headache, unspecified: Secondary | ICD-10-CM

## 2016-03-29 DIAGNOSIS — R51 Headache: Secondary | ICD-10-CM | POA: Diagnosis not present

## 2016-03-29 DIAGNOSIS — Z8249 Family history of ischemic heart disease and other diseases of the circulatory system: Secondary | ICD-10-CM

## 2016-03-30 ENCOUNTER — Encounter: Payer: Self-pay | Admitting: Family Medicine

## 2016-04-19 ENCOUNTER — Other Ambulatory Visit: Payer: Self-pay | Admitting: Gastroenterology

## 2016-04-19 DIAGNOSIS — Z1231 Encounter for screening mammogram for malignant neoplasm of breast: Secondary | ICD-10-CM

## 2016-05-18 ENCOUNTER — Ambulatory Visit
Admission: RE | Admit: 2016-05-18 | Discharge: 2016-05-18 | Disposition: A | Discharge status: Home / Self Care | Payer: Medicare Other | Source: Ambulatory Visit | Attending: Gastroenterology | Admitting: Gastroenterology

## 2016-05-18 DIAGNOSIS — Z1231 Encounter for screening mammogram for malignant neoplasm of breast: Secondary | ICD-10-CM

## 2016-10-02 ENCOUNTER — Encounter (INDEPENDENT_AMBULATORY_CARE_PROVIDER_SITE_OTHER): Payer: Self-pay

## 2016-10-02 ENCOUNTER — Telehealth: Payer: Self-pay | Admitting: Interventional Cardiology

## 2016-10-02 ENCOUNTER — Ambulatory Visit (INDEPENDENT_AMBULATORY_CARE_PROVIDER_SITE_OTHER): Payer: Medicare Other | Admitting: Physician Assistant

## 2016-10-02 VITALS — BP 144/80 | HR 64 | Ht 64.0 in | Wt 161.2 lb

## 2016-10-02 DIAGNOSIS — I493 Ventricular premature depolarization: Secondary | ICD-10-CM

## 2016-10-02 DIAGNOSIS — M542 Cervicalgia: Secondary | ICD-10-CM

## 2016-10-02 DIAGNOSIS — E876 Hypokalemia: Secondary | ICD-10-CM | POA: Diagnosis not present

## 2016-10-02 DIAGNOSIS — I2510 Atherosclerotic heart disease of native coronary artery without angina pectoris: Secondary | ICD-10-CM

## 2016-10-02 DIAGNOSIS — R079 Chest pain, unspecified: Secondary | ICD-10-CM

## 2016-10-02 DIAGNOSIS — I10 Essential (primary) hypertension: Secondary | ICD-10-CM

## 2016-10-02 NOTE — Patient Instructions (Addendum)
Medication Instructions:  Your physician recommends that you continue on your current medications as directed. Please refer to the Current Medication list given to you today.   Labwork: None ordered  Testing/Procedures: Your physician has requested that you have a lexiscan myoview. For further information please visit [email or web address] Please follow instruction sheet, as given.    Follow-Up: Your physician recommends that you schedule a follow-up appointment in: 3 MONTHS WITH DR. Katrinka Blazing    Any Other Special Instructions Will Be Listed Below (If Applicable).  Pharmacologic Stress Electrocardiogram A pharmacologic stress electrocardiogram is a heart (cardiac) test that uses nuclear imaging to evaluate the blood supply to your heart. This test may also be called a pharmacologic stress electrocardiography. Pharmacologic means that a medicine is used to increase your heart rate and blood pressure.  This stress test is done to find areas of poor blood flow to the heart by determining the extent of coronary artery disease (CAD). Some people exercise on a treadmill, which naturally increases the blood flow to the heart. For those people unable to exercise on a treadmill, a medicine is used. This medicine stimulates your heart and will cause your heart to beat harder and more quickly, as if you were exercising.  Pharmacologic stress tests can help determine:  The adequacy of blood flow to your heart during increased levels of activity in order to clear you for discharge home.  The extent of coronary artery blockage caused by CAD.  Your prognosis if you have suffered a heart attack.  The effectiveness of cardiac procedures done, such as an angioplasty, which can increase the circulation in your coronary arteries.  Causes of chest pain or pressure. LET [facility] CARE PROVIDER KNOW ABOUT:  Any allergies you have.  All medicines you are taking, including vitamins, herbs, eye drops, creams,  and over-the-counter medicines.  Previous problems you or members of your family have had with the use of anesthetics.  Any blood disorders you have.  Previous surgeries you have had.  Medical conditions you have.  Possibility of pregnancy, if this applies.  If you are currently breastfeeding. RISKS AND COMPLICATIONS Generally, this is a safe procedure. However, as with any procedure, complications can occur. Possible complications include:  You develop pain or pressure in the following areas:  Chest.  Jaw or neck.  Between your shoulder blades.  Radiating down your left arm.  Headache.  Dizziness or light-headedness.  Shortness of breath.  Increased or irregular heartbeat.  Low blood pressure.  Nausea or vomiting.  Flushing.  Redness going up the arm and slight pain during injection of medicine.  Heart attack (rare). BEFORE THE PROCEDURE   Avoid all forms of caffeine for 24 hours before your test or as directed by your health care provider. This includes coffee, tea (even decaffeinated tea), caffeinated sodas, chocolate, cocoa, and certain pain medicines.  Follow your health care provider's instructions regarding eating and drinking before the test.  Take your medicines as directed at regular times with water unless instructed otherwise. Exceptions may include:  If you have diabetes, ask how you are to take your insulin or pills. It is common to adjust insulin dosing the morning of the test.  If you are taking beta-blocker medicines, it is important to talk to your health care provider about these medicines well before the date of your test. Taking beta-blocker medicines may interfere with the test. In some cases, these medicines need to be changed or stopped 24 hours or more before the test.  If you wear a nitroglycerin patch, it may need to be removed prior to the test. Ask your health care provider if the patch should be removed before the test.  If you  use an inhaler for any breathing condition, bring it with you to the test.  If you are an outpatient, bring a snack so you can eat right after the stress phase of the test.  Do not smoke for 4 hours prior to the test or as directed by your health care provider.  Do not apply lotions, powders, creams, or oils on your chest prior to the test.  Wear comfortable shoes and clothing. Let your health care provider know if you were unable to complete or follow the preparations for your test. PROCEDURE   Multiple patches (electrodes) will be put on your chest. If needed, small areas of your chest may be shaved to get better contact with the electrodes. Once the electrodes are attached to your body, multiple wires will be attached to the electrodes, and your heart rate will be monitored.  An IV access will be started. A nuclear trace (isotope) is given. The isotope may be given intravenously, or it may be swallowed. Nuclear refers to several types of radioactive isotopes, and the nuclear isotope lights up the arteries so that the nuclear images are clear. The isotope is absorbed by your body. This results in low radiation exposure.  A resting nuclear image is taken to show how your heart functions at rest.  A medicine is given through the IV access.  A second scan is done about 1 hour after the medicine injection and determines how your heart functions under stress.  During this stress phase, you will be connected to an electrocardiogram machine. Your blood pressure and oxygen levels will be monitored. AFTER THE PROCEDURE   Your heart rate and blood pressure will be monitored after the test.  You may return to your normal schedule, including diet,activities, and medicines, unless your health care provider tells you otherwise. This information is not intended to replace advice given to you by your health care provider. Make sure you discuss any questions you have with your health care  provider. Document Released: 01/20/2009 Document Revised: 09/08/2013 Document Reviewed: 05/11/2013 Elsevier Interactive Patient Education  2017 [employer]   If you need a refill on your cardiac medications before your next appointment, please call your pharmacy.

## 2016-10-02 NOTE — Progress Notes (Signed)
Cardiology Office Note    Date:  10/02/2016   ID:  [name], DOB 25-Sep-1945, MRN [id number]  PCP:  MCDIARMID,TODD D, MD  Cardiologist: Dr. Katrinka BlazingSmith   CC: chest pain/ neck pain   History of Present Illness:  [name] is a 71 y.o. 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  She had chest pain in 2008 and underwent coronary angiography which showed angiographically patent coronary arteries and normal LV function. Noted to have contrast dye allergy at that time.    She was last seen by Dr. Katrinka BlazingSmith in 10/2015 for follow up. Her atenolol was increased for symptomatic PVCs. She was started on ASA 81mg  daily. Labs were ordered which showed hypokalemia. Sprio was started and K adjusted until K in normal ranges.   Today she presents to clinic for evaluation of chest pain and neck pain. She started noticing a burning chest pain that comes and goes that is a 3/10 at its worst. Not related to exertion and comes on sporadically. Chest burning lasts from 10-30 seconds. Nothing makes it better or worse. She is also have left sided neck pain that is unrelated to chest pain. Neck pain is worse when laying on her left side. Nothing makes it better or worse. She has had some dyspnea on exertion and noticed she is more short of breath with less exertion. Palpitations are better on increase atenolol. No LE edema, orthopnea or PND. No dizziness or syncope. No blood in stool or urine.    Past Medical History:  Diagnosis Date  . Adjustment disorder with mixed anxiety and depressed mood 10/28/2014  . ANAL FISSURE, HX OF 11/17/2009   Qualifier: Diagnosis of  By: McDiarmid MD, Tawanna Coolerodd    . Anemia   . ANEMIA, MILD, HX OF 11/21/2009   Qualifier: Diagnosis of  By: McDiarmid MD, Tawanna Coolerodd    . ARTHRITIS, ACROMIOCLAVICULAR 10/11/2005   Qualifier: Diagnosis of  By: McDiarmid MD, Tawanna Coolerodd    . DEGENERATIVE DISC DISEASE,  CERVICAL SPINE 11/27/2007   Qualifier: Diagnosis of  By: McDiarmid MD, Tawanna Coolerodd    . DEPRESSION, HX OF 11/17/2009   Qualifier: Diagnosis of  By: McDiarmid MD, Tawanna Coolerodd    . 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 GASTROESOPHAGEAL REFLUX DISEASE 11/17/2009   Qualifier: Diagnosis of  By: McDiarmid MD, Tawanna Coolerodd    . HIATAL HERNIA 07/08/1997   Annotation: Dx on abdominal ultrasound Qualifier: Diagnosis of  By: McDiarmid MD, Tawanna Coolerodd    . History of colon polyps 11/20/2010   Dx in 2008 by Dr Charna ElizabethJyothi Mann.    . History of pneumonia 05/17/2011  . History of pneumonia 05/17/2011   Infiltrate developed on CXR with fever during hospitalization 05/15/11 for N/V secondary to to Eosinophilic gastroenteritis.  Suspect Aspiration pneumonitis.    . Hyperlipidemia 09/24/2014  . HYPOKALEMIA, HX OF 11/17/2009   Presumed secondary to lymphocytic colitis.    . Irregular heart beat   . Jaw pain 10/28/2014  . 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 Moderate obesity 11/17/2009   Qualifier: Diagnosis of  By: McDiarmid MD, Tawanna Coolerodd    . OPHTHALMIC MIGRAINES, HX OF 06/08/2008   Qualifier: Diagnosis of  By: McDiarmid MD, Tawanna Coolerodd    . Osteopenia 12/14/2014   T socre lumbar in 2015 = (-) 1.8. FRAX 5% major fracture 10 year risk.  0.5% 10 year risk hip fracture.    . Paresthesia  of lower limb 01/16/2011  . Personal history of other disorders of nervous system and sense organs 11/17/2009   Centricity Description: BENIGN POSITIONAL VERTIGO, HX OF Qualifier: Diagnosis of  By: McDiarmid MD, Tawanna Cooler   Centricity Description: CARPAL TUNNEL SYNDROME, BILATERAL, HX OF Qualifier: History of  By: McDiarmid MD, Tawanna Cooler    . Premature ventricular contractions 11/17/2009   Qualifier: Diagnosis of  By: McDiarmid MD, Tawanna Cooler    . 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 Right ankle swelling 04/06/2015  . Right knee pain 04/29/2014  . SINUSITIS 11/17/2009   Qualifier: History of  By: McDiarmid MD, Tawanna Cooler    . TRIGGER FINGER, RIGHT THUMB 05/09/2001   Qualifier: History of  By: McDiarmid MD, Tawanna Cooler      . TROCHANTERIC BURSITIS, RIGHT 11/17/2009   Qualifier: History of  By: McDiarmid MD, Tawanna Cooler      Past Surgical History:  Procedure Laterality Date  . ABDOMINAL HYSTERECTOMY  1988   partial  . APPENDECTOMY    . BIOPSY BREAST  09/2001  . BREAST SURGERY     extraction  . CARDIAC CATHETERIZATION  12/2006   Dr Lacretia Nicks. Elsie Lincoln. No obstruction.  Normal LV  . CARPAL TUNNEL RELEASE     bilateral wrists.   . COLONOSCOPY W/ BIOPSIES  09/2006   Dx withDr Arty Baumgartner (GI): Dx with  Lymphocytic Colitis on biopsies. No polyps or masses.   Marland Kitchen DILATION AND CURETTAGE OF UTERUS  1988  . EYE SURGERY     terygium  . GUM SURGERY    . HERNIA REPAIR     umbilical, repaired twice.   . TONSILLECTOMY    . TRIGGER FINGER RELEASE     Release of stenosising tenosynovitis of thumbs  . TUBAL LIGATION  1975  . UGI Barium  1999   normal  . Ultrasound of gall bladder with CCK-injection  1999   normal    Current Medications: Outpatient Medications Prior to Visit  Medication Sig Dispense Refill  . aspirin 81 MG tablet Take 1 tablet (81 mg total) by mouth daily. 30 tablet   . atenolol (TENORMIN) 25 MG tablet TAKE 1 TABLET (25 MG TOTAL) BY MOUTH DAILY. 90 tablet 0  . atenolol (TENORMIN) 50 MG tablet Take 1 tablet (50 mg total) by mouth daily. 90 tablet 3  . Calcium Carb-Cholecalciferol (CALCIUM 600 + D PO) Take 1 tablet by mouth daily.     . Coenzyme Q10 (COQ-10) 50 MG CAPS Take 1 capsule by mouth 2 (two) times daily.    . COLLAGEN PO Take 1 capsule by mouth daily.     Marland Kitchen EVENING PRIMROSE OIL PO Take 1 capsule by mouth daily.    . famotidine-calcium carbonate-magnesium hydroxide (PEPCID COMPLETE) 10-800-165 MG CHEW chewable tablet Chew 1 tablet by mouth daily as needed (heartburn/acid reflux).     Marland Kitchen loperamide (IMODIUM A-D) 2 MG tablet Take 1 tablet (2 mg total) by mouth 4 (four) times daily as needed for diarrhea or loose stools. 30 tablet 0  . Misc Natural Products (GRAPE SEED COMPLEX PO) Take 1 capsule by mouth 2 (two)  times daily.    . Multiple Vitamin (MULTIVITAMIN WITH MINERALS) TABS tablet Take 1 tablet by mouth daily.    . Omega-3 Fatty Acids (FISH OIL PO) Take 1 capsule by mouth 2 (two) times daily.     . potassium chloride SA (K-DUR,KLOR-CON) 20 MEQ tablet Take 40 mEq by mouth 2 (two) times daily.    . Probiotic Product (PROBIOTIC DAILY PO) Take 1  tablet by mouth daily.    . Red Yeast Rice 600 MG CAPS Take 1 capsule by mouth 2 (two) times daily.      No facility-administered medications prior to visit.      Allergies:   Cefuroxime; Azithromycin; Contrast media [iodinated diagnostic agents]; Oxycodone; Phenergan [promethazine hcl]; Shellfish allergy; Statins; Amoxicillin; and Tramadol   Social History   Social History  . Marital status: Legally Separated    Spouse name: N/A  . Number of children: 3  . Years of education: 12   Occupational History  . retired [employer]   Social History Main Topics  . Smoking status: Never Smoker  . Smokeless tobacco: Never Used  . Alcohol use 0.5 oz/week    1 drink(s) per week  . Drug use: No  . Sexual activity: Not on file   Other Topics Concern  . Not on file   Social History Narrative   Divorced.  Ex-Husband Cheral Almas   3 grown children   5 brothers and 3 sisters   Occupation: retired, previously an Manufacturing engineer at [employer]   No pets   Education: college   Heterosexual   Owns a car   Patient's Cell phone [phone]   Renting home   Never Smoked   Drug use-no   Alcohol use: drinks infrequently   Exercise: No regular exercise routine   Always wears seatbelts   No hx of STD   Sun exposure: rarely   Religion: Jehovah's Witness - NO BLOOD PRODUCTS                                               Family History:  The patient's family history includes Cancer in her mother; Hearing loss in her mother; Heart disease in her father; Stroke in her brother and father.      ROS:   Please see the  history of present illness.    ROS All other systems reviewed and are negative.   PHYSICAL EXAM:   VS:  BP (!) 144/80   Pulse 64   Ht 5\' 4"  (1.626 m)   Wt 161 lb 3.2 oz (73.1 kg)   BMI 27.67 kg/m    GEN: Well nourished, well developed, in no acute distress  HEENT: normal  Neck: no JVD, carotid bruits, or masses Cardiac: RRR; no murmurs, rubs, or gallops,no edema  Respiratory:  clear to auscultation bilaterally, normal work of breathing GI: soft, nontender, nondistended, + BS MS: no deformity or atrophy  Skin: warm and dry, no rash Neuro:  Alert and Oriented x 3, Strength and sensation are intact Psych: euthymic mood, full affect   Wt Readings from Last 3 Encounters:  10/02/16 161 lb 3.2 oz (73.1 kg)  03/29/16 173 lb (78.5 kg)  03/21/16 173 lb (78.5 kg)      Studies/Labs Reviewed:   EKG:  EKG is ordered today.  The ekg ordered today demonstrates NSR with non specific ST flattening similar to previous.   Recent Labs: 11/03/2015: ALT 14 12/26/2015: BUN 9; Creat 0.54; Potassium 4.1; Sodium 141   Lipid Panel    Component Value Date/Time   CHOL 244 (H) 11/03/2015 0922   TRIG 54 11/03/2015 0922   HDL 59 11/03/2015 0922   CHOLHDL 4.1 11/03/2015 0922   VLDL 11 11/03/2015 0922   LDLCALC 174 (H)  11/03/2015 0922   LDLDIRECT 119 06/21/2015 0947    Additional studies/ records that were reviewed today include:  Cath 2008 FINAL IMPRESSION:  1. Angiographically patent coronary arteries.  2. Normal LV systolic function.  3. Successful AngioSeal closure of the right femoral artery.  4. No allergies manifested during this case with her previous history      of dye allergy.   PLAN:  The patient will be ambulated in 1-2 hours and discharged and followed up as an outpatient.  We started her on Crestor 20 mg a day for cholesterol-lowering; and we will plan to follow her along with the excellent care Dr. Leslee Home.    ASSESSMENT & PLAN:   Chest pain: atypical for cardiac  chest pain and neck pain sounds MSK. With worsening DOE and 3V CAD noted on CT scan in 2014, will order lexiscan myoview  HTN: BP borderline controlled today. No changes made  HLD: continue statin   PVCs: well controlled on atenolol 50mg  daily.   Hypokalemia: improved on spiro and K supplementation   Medication Adjustments/Labs and Tests Ordered: Current medicines are reviewed at length with the patient today.  Concerns regarding medicines are outlined above.  Medication changes, Labs and Tests ordered today are listed in the Patient Instructions below. Patient Instructions  Medication Instructions:  Your physician recommends that you continue on your current medications as directed. Please refer to the Current Medication list given to you today.   Labwork: None ordered  Testing/Procedures: Your physician has requested that you have a lexiscan myoview. For further information please visit [email or web address] Please follow instruction sheet, as given.    Follow-Up: Your physician recommends that you schedule a follow-up appointment in: 3 MONTHS WITH DR. Katrinka Blazing    Any Other Special Instructions Will Be Listed Below (If Applicable).  Pharmacologic Stress Electrocardiogram A pharmacologic stress electrocardiogram is a heart (cardiac) test that uses nuclear imaging to evaluate the blood supply to your heart. This test may also be called a pharmacologic stress electrocardiography. Pharmacologic means that a medicine is used to increase your heart rate and blood pressure.  This stress test is done to find areas of poor blood flow to the heart by determining the extent of coronary artery disease (CAD). Some people exercise on a treadmill, which naturally increases the blood flow to the heart. For those people unable to exercise on a treadmill, a medicine is used. This medicine stimulates your heart and will cause your heart to beat harder and more quickly, as if you were exercising.    Pharmacologic stress tests can help determine:  The adequacy of blood flow to your heart during increased levels of activity in order to clear you for discharge home.  The extent of coronary artery blockage caused by CAD.  Your prognosis if you have suffered a heart attack.  The effectiveness of cardiac procedures done, such as an angioplasty, which can increase the circulation in your coronary arteries.  Causes of chest pain or pressure. LET [facility] CARE PROVIDER KNOW ABOUT:  Any allergies you have.  All medicines you are taking, including vitamins, herbs, eye drops, creams, and over-the-counter medicines.  Previous problems you or members of your family have had with the use of anesthetics.  Any blood disorders you have.  Previous surgeries you have had.  Medical conditions you have.  Possibility of pregnancy, if this applies.  If you are currently breastfeeding. RISKS AND COMPLICATIONS Generally, this is a safe procedure. However, as with any procedure, complications  can occur. Possible complications include:  You develop pain or pressure in the following areas:  Chest.  Jaw or neck.  Between your shoulder blades.  Radiating down your left arm.  Headache.  Dizziness or light-headedness.  Shortness of breath.  Increased or irregular heartbeat.  Low blood pressure.  Nausea or vomiting.  Flushing.  Redness going up the arm and slight pain during injection of medicine.  Heart attack (rare). BEFORE THE PROCEDURE   Avoid all forms of caffeine for 24 hours before your test or as directed by your health care provider. This includes coffee, tea (even decaffeinated tea), caffeinated sodas, chocolate, cocoa, and certain pain medicines.  Follow your health care provider's instructions regarding eating and drinking before the test.  Take your medicines as directed at regular times with water unless instructed otherwise. Exceptions may include:  If you  have diabetes, ask how you are to take your insulin or pills. It is common to adjust insulin dosing the morning of the test.  If you are taking beta-blocker medicines, it is important to talk to your health care provider about these medicines well before the date of your test. Taking beta-blocker medicines may interfere with the test. In some cases, these medicines need to be changed or stopped 24 hours or more before the test.  If you wear a nitroglycerin patch, it may need to be removed prior to the test. Ask your health care provider if the patch should be removed before the test.  If you use an inhaler for any breathing condition, bring it with you to the test.  If you are an outpatient, bring a snack so you can eat right after the stress phase of the test.  Do not smoke for 4 hours prior to the test or as directed by your health care provider.  Do not apply lotions, powders, creams, or oils on your chest prior to the test.  Wear comfortable shoes and clothing. Let your health care provider know if you were unable to complete or follow the preparations for your test. PROCEDURE   Multiple patches (electrodes) will be put on your chest. If needed, small areas of your chest may be shaved to get better contact with the electrodes. Once the electrodes are attached to your body, multiple wires will be attached to the electrodes, and your heart rate will be monitored.  An IV access will be started. A nuclear trace (isotope) is given. The isotope may be given intravenously, or it may be swallowed. Nuclear refers to several types of radioactive isotopes, and the nuclear isotope lights up the arteries so that the nuclear images are clear. The isotope is absorbed by your body. This results in low radiation exposure.  A resting nuclear image is taken to show how your heart functions at rest.  A medicine is given through the IV access.  A second scan is done about 1 hour after the medicine injection  and determines how your heart functions under stress.  During this stress phase, you will be connected to an electrocardiogram machine. Your blood pressure and oxygen levels will be monitored. AFTER THE PROCEDURE   Your heart rate and blood pressure will be monitored after the test.  You may return to your normal schedule, including diet,activities, and medicines, unless your health care provider tells you otherwise. This information is not intended to replace advice given to you by your health care provider. Make sure you discuss any questions you have with your health care provider.  Document Released: 01/20/2009 Document Revised: 09/08/2013 Document Reviewed: 05/11/2013 Elsevier Interactive Patient Education  2017 [employer].   If you need a refill on your cardiac medications before your next appointment, please call your pharmacy.      Signed, Cline Crock, PA-C  10/02/2016 3:25 PM    [facility] [address] [place], [place], [place]  [address] Phone: [phone]; Fax: [phone]

## 2016-10-02 NOTE — Telephone Encounter (Signed)
Pt states that x 3 days she has had left side neck pain and CP that feels like a burning sensation.  This comes a goes 3-4 times a day and lasts about 2-3 minutes.  Denies dizziness, lightheadedness, SOB or swelling.  Does not have Nitro and unable to check vitals.  Pt had one episode of nausea and said it only lasted a couple minutes.  Thinks she feels a "lump" at the base of her neck.  Spoke with Bary CastillaKaty Thompson, PA-C and she said ok to add pt to her schedule today.  Advised pt to come in and see Katy at 3pm.  Pt aware to arrive early for check in.  Pt appreciative for assistance.

## 2016-10-02 NOTE — Telephone Encounter (Signed)
New message   Pt verbalized that she has been having neck pain on the left side and heart burn  Pt c/o of Chest Pain: STAT if CP now or developed within 24 hours  1. Are you having CP right now? no  2. Are you experiencing any other symptoms (ex. SOB, nausea, vomiting, sweating)? Nausea one day 3. How long have you been experiencing CP? 3 days  4. Is your CP continuous or coming and going? coming and going  5. Have you taken Nitroglycerin? no?

## 2016-10-05 ENCOUNTER — Telehealth (HOSPITAL_COMMUNITY): Payer: Self-pay | Admitting: *Deleted

## 2016-10-05 NOTE — Telephone Encounter (Signed)
Patient given detailed instructions per Myocardial Perfusion Study Information Sheet for the test on  10/09/16. Patient notified to arrive 15 minutes early and that it is imperative to arrive on time for appointment to keep from having the test rescheduled.  If you need to cancel or reschedule your appointment, please call the office within 24 hours of your appointment. Failure to do so may result in a cancellation of your appointment, and a $50 no show fee. Patient verbalized understanding. [name]    

## 2016-10-09 ENCOUNTER — Ambulatory Visit (HOSPITAL_COMMUNITY): Payer: Medicare Other | Attending: Internal Medicine

## 2016-10-09 DIAGNOSIS — R079 Chest pain, unspecified: Secondary | ICD-10-CM

## 2016-10-09 DIAGNOSIS — I10 Essential (primary) hypertension: Secondary | ICD-10-CM | POA: Diagnosis not present

## 2016-10-09 DIAGNOSIS — R002 Palpitations: Secondary | ICD-10-CM | POA: Insufficient documentation

## 2016-10-09 DIAGNOSIS — R0609 Other forms of dyspnea: Secondary | ICD-10-CM | POA: Insufficient documentation

## 2016-10-09 DIAGNOSIS — M542 Cervicalgia: Secondary | ICD-10-CM | POA: Insufficient documentation

## 2016-10-09 DIAGNOSIS — R0789 Other chest pain: Secondary | ICD-10-CM | POA: Insufficient documentation

## 2016-10-09 DIAGNOSIS — I2510 Atherosclerotic heart disease of native coronary artery without angina pectoris: Secondary | ICD-10-CM | POA: Diagnosis not present

## 2016-10-09 DIAGNOSIS — R9439 Abnormal result of other cardiovascular function study: Secondary | ICD-10-CM | POA: Insufficient documentation

## 2016-10-09 DIAGNOSIS — R110 Nausea: Secondary | ICD-10-CM | POA: Diagnosis not present

## 2016-10-09 LAB — MYOCARDIAL PERFUSION IMAGING
LV dias vol: 61 mL (ref 46–106)
LV sys vol: 14 mL
Peak HR: 104 {beats}/min
RATE: 0.26
Rest HR: 70 {beats}/min
SDS: 10
SRS: 3
SSS: 13
TID: 0.67

## 2016-10-09 MED ORDER — TECHNETIUM TC 99M TETROFOSMIN IV KIT
32.6000 | PACK | Freq: Once | INTRAVENOUS | Status: AC | PRN
  Administered 2016-10-09: 32.6 via INTRAVENOUS
  Filled 2016-10-09: qty 33

## 2016-10-09 MED ORDER — TECHNETIUM TC 99M TETROFOSMIN IV KIT
10.2000 | PACK | Freq: Once | INTRAVENOUS | Status: AC | PRN
  Administered 2016-10-09: 10.2 via INTRAVENOUS
  Filled 2016-10-09: qty 11

## 2016-10-09 MED ORDER — REGADENOSON 0.4 MG/5ML IV SOLN
0.4000 mg | Freq: Once | INTRAVENOUS | Status: AC
  Administered 2016-10-09: 0.4 mg via INTRAVENOUS

## 2016-10-11 ENCOUNTER — Telehealth: Payer: Self-pay | Admitting: Interventional Cardiology

## 2016-10-11 NOTE — Telephone Encounter (Signed)
Mrs. Ike BeneOdom is returning a call .  Thanks

## 2016-10-11 NOTE — Telephone Encounter (Signed)
PT AWARE OF MYOVIEW RESULTS./CY 

## 2016-10-16 DIAGNOSIS — K52832 Lymphocytic colitis: Secondary | ICD-10-CM | POA: Diagnosis not present

## 2016-10-16 DIAGNOSIS — Z1211 Encounter for screening for malignant neoplasm of colon: Secondary | ICD-10-CM | POA: Diagnosis not present

## 2016-11-08 ENCOUNTER — Other Ambulatory Visit: Payer: Self-pay | Admitting: Family Medicine

## 2016-11-29 ENCOUNTER — Ambulatory Visit (INDEPENDENT_AMBULATORY_CARE_PROVIDER_SITE_OTHER): Payer: Medicare Other | Admitting: Family Medicine

## 2016-11-29 ENCOUNTER — Encounter: Payer: Self-pay | Admitting: Family Medicine

## 2016-11-29 VITALS — BP 148/72 | HR 62 | Temp 98.5°F | Wt 160.0 lb

## 2016-11-29 DIAGNOSIS — E7800 Pure hypercholesterolemia, unspecified: Secondary | ICD-10-CM | POA: Diagnosis not present

## 2016-11-29 DIAGNOSIS — R1314 Dysphagia, pharyngoesophageal phase: Secondary | ICD-10-CM

## 2016-11-29 DIAGNOSIS — R600 Localized edema: Secondary | ICD-10-CM | POA: Diagnosis not present

## 2016-11-29 DIAGNOSIS — R634 Abnormal weight loss: Secondary | ICD-10-CM | POA: Diagnosis not present

## 2016-11-29 DIAGNOSIS — I10 Essential (primary) hypertension: Secondary | ICD-10-CM

## 2016-11-29 DIAGNOSIS — F4323 Adjustment disorder with mixed anxiety and depressed mood: Secondary | ICD-10-CM

## 2016-11-29 NOTE — Patient Instructions (Signed)
Dr McDiarmid will call you if your tests are not good. Otherwise he will send you a letter.  If you sign up for MyChart online, you will be able to see your test results once Dr McDiarmid has reviewed them.  If you do not hear from us with in 2 weeks please call our office  I will send a note to Dr Loreta AveMann about your weight loss, difficulty swallowing, and loud bowel sounds.

## 2016-11-30 ENCOUNTER — Encounter: Payer: Self-pay | Admitting: Family Medicine

## 2016-11-30 DIAGNOSIS — R1310 Dysphagia, unspecified: Secondary | ICD-10-CM | POA: Insufficient documentation

## 2016-11-30 NOTE — Assessment & Plan Note (Signed)
New problem Intermittent dysphagia after swallow of liquids, nonprogressive Pain in right lateral neck adjacent to posterior lateral cricoid region.   Likely just a motility issue with aging, but complaint accompanied by document weight loss over last 9 months with pain is concerning.  Patient with history of eosinophilic gastroenteritis 2012.  Patient with hx of GERD.   Patient planning on seeing Dr Loreta AveMann (GI) soon.  We sent a message to Dr Loreta AveMann to let her know of the dysphagia concern so the patient and her can discuss at next visit.   Discussed "Red Flags" requiring reevaluation, like progression to difficulty swallowing solids, food getting stuck, continued unintentional weight loss.

## 2016-11-30 NOTE — Assessment & Plan Note (Signed)
new to my practice problem with no further workup. Suspect mild venous insufficiency.  Recommend low salt diet with leg elevation periodically thruoghtout day and wearing OTC support stockings during day.  Recommend exercise involving legs.

## 2016-11-30 NOTE — Assessment & Plan Note (Signed)
New problem 20% patients with unintentional weight loss without cause found after investigation Appetitie naturally diminishes with age related to gastric hormone.  Food intake also deminishes with age due to decrease activity, lowered basal metabolic rate, and loss muscle mass.   Possible diagnostic labs or imaging: CMET, LDH,  CBC and Ferritin, ESR, C- reactive protein, vitamin B12, Folate, Vitamin D,  ANA, TSH, FOBT, +/- urinalysis with culture, CXR, Abdomina 2 Viex Xray,  Abdominal US, Thoracic/Abdominal/Pelvic CT with contrast.   * Common etiology  Common Treatments - Treat depression / anxiety - Assess Swallow function

## 2016-11-30 NOTE — Assessment & Plan Note (Signed)
Taking Red yeast rice Lipid panel drawn. Primary prevention

## 2016-11-30 NOTE — Assessment & Plan Note (Signed)
Need focus on stressors, anxiety and depression next visit.

## 2016-11-30 NOTE — Progress Notes (Signed)
Subjective:     Patient ID: [name], female   DOB: 16-Aug-1946, 71 y.o.   MRN: [id number] [name] is alone Sources of clinical information for visit is/are patient and past medical records. Nursing assessment for this office visit was reviewed with the patient for accuracy and revision.  HPI   HYPERTENSION  Disease Monitoring: Blood pressure range-not measuring at home Chest pain- no      Dyspnea- no Takes daily baby aspirin Medications: Compliance- taking atenolol, Klor-con  Associated symptoms: Lightheadedness- no   Edema- trace at ankles bilaterally    HYPERLIPIDEMIA  Disease Monitoring: See symptoms for Hypertension  Medications: taking OTC Red Yeast Rice 1 caps BID RUQ pain- no  Muscle aches- no   Problems swallowing - Onset aout 3 months ago - pain in right lateral anterior neck persistent that worsens with swallow - "feels like a chicken bone stuck in throat" - Last year patient had a chicken bone stuck in her throat which she was able to self-remove. - Swallowing difficulty intermittent to liquids - Cold liquids are more likely to be difficult to swallow- no difficulty with swallowing solids.  No difficulty swallowing tablets.  - Denies melena/ BRBPR - (+) weight loss that is unintentional - No vomiting - No hsitory of smoking.  Drinks alcohol about twice a year on Holidays.    Shooting Pains - Sharp pinprick pains - Over a year ago onset.  No increase in frequency over time.  Occurs randomly thruout day.  Can go many days without an occurrence. Last about a second. Location of pains in proximal limbs but occasionally on trunk and shoulder.  None on head/face.    Chronic Diarrhea - Presence for several years - Takes famotidine for GERD symptoms.  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   Chronic back pains/headaches - Longstanding issue - both thoracic and lumbar pain - no radiation below knees or elbows.  - No weakness in arms or limbs.  No new problems with bladder.  Maintaining fcal continence.    Weight Loss 13 pounds weight loss since 03/2016, unintentional  Constitutional symptoms: No  Medications cause: No  Eating: No  Poor Appetite:  No  Late onset psychosis:  No  Swallowing pxs: Yes   Oral pxs: No - Good dentition and appliances Limited money: No  Enteric pxs: Yes, history of eosinophilic gastoenteritis 2012 and lymphocytic colitis in 2008.  Has GERD Emotional Depression or Anxiety: Anxiety Low salt or low fat diet: No  Stones, social problems (e.g., isolation, inability to obtain preferred foods): Uncertain if pt is socially isolated since recent divorce after many years of marriage. .   Symptoms:  General Thirst: No  Fever: No  Night Sweats:No   Rigors: No  Fatigue: No    HEENT Headache: Yes  Oral sores / bad teeth: No  If dentures, well-fitting: No  Cardiovascular Abdominal pain with eating: No  Heart Failure Hx: No  Respiratory Pulmonary Disease Hx: No  Cough: No  Gastrointestinal Indigestion/heartburn: Yes  Epigastric Pain: No  Hematemesis: No  Nausea and/or Vomiting: No  (Meds associated with nausea and vomiting: Abx, bisphosphonates, digoxin, dopamine  agonists, metformin, SS/NRIs, Statins, TCAs): NO  Melena: No  Hematochezia: No  Abdomina Swelling: No  Bloating: No  Diarrhea: Yes, chronic Constipation: No  Genitourinary Flank pain: No  Suprapubic pain: No  Dysuria: No  Frequency: No  Urinary hesitancy: No  New urinary incontinence: No  Hematuria: No  Vaginal bleeding: No  Breast Mass or other change: No  Endocrine Polydipsia: No  Goiter: No  Hematologic Anemia History: No   Swollen lymph nodes: No  Musculoskeletal Shoulder stiffness:  No  Muscle strength decrease: No  Joint Swelling/pain: No  Neuropsychologic Prolonged sadness: No Loss of pleasure: No  Paranoia: No  Anxiousness / fearfullness: Yes   Cancer Screening History Breast Cancer: No  Cervical Cancer: No  Colorectal Cancer: No  Lung Cancer: No   Review of Systems  See HPI above   PMH Smoking Status noted    Objective:   Physical Exam VS reviewed GEN: Alert, Cooperative, Groomed, NAD HEENT: PERRL;  No cervical LAN, No thyromegaly, No palpable masses, no abnormalilty in area of discomfort on right anterior neck adjacent to tracheal just below thyroid cartilage. No overlying skin changes in area.  No tenderness to palp.   COR: RRR, No M/G/R, No JVD, Normal PMI size and location LUNGS: BCTA, No Acc mm use, speaking in full sentences ABDOMEN: (+)BS, soft, NT, ND, No HSM, No palpable masses EXT: No peripheral leg edema. Feet without deformity or lesions. Palpable bilateral pedal pulses.  SKIN: No lesion nor rashes of face/trunk/extremities Neuro: Oriented to person, place, and time, normal muscle tone, no tremor DTR: Bilateral Bicep 2+, Bilateral Triceps 2+, Bilateral Knees 2+, Bilateral Ankles 1+ Gait: Normal speed, No significant path deviation, Step through +,  Psych: Normal affect/thought/speech/language     Assessment:     See problem list    Plan:     See problem list

## 2016-11-30 NOTE — Assessment & Plan Note (Signed)
Established problem Controlled Continue Atenolol

## 2016-12-06 ENCOUNTER — Other Ambulatory Visit: Payer: Self-pay | Admitting: Interventional Cardiology

## 2016-12-06 DIAGNOSIS — I493 Ventricular premature depolarization: Secondary | ICD-10-CM

## 2016-12-13 ENCOUNTER — Other Ambulatory Visit: Payer: Medicare Other

## 2016-12-13 DIAGNOSIS — E7800 Pure hypercholesterolemia, unspecified: Secondary | ICD-10-CM

## 2016-12-13 DIAGNOSIS — R634 Abnormal weight loss: Secondary | ICD-10-CM

## 2016-12-14 ENCOUNTER — Other Ambulatory Visit: Payer: Self-pay | Admitting: Family Medicine

## 2016-12-14 ENCOUNTER — Telehealth: Payer: Self-pay | Admitting: Family Medicine

## 2016-12-14 DIAGNOSIS — E876 Hypokalemia: Secondary | ICD-10-CM

## 2016-12-14 LAB — COMPREHENSIVE METABOLIC PANEL
ALT: 9 IU/L (ref 0–32)
AST: 18 IU/L (ref 0–40)
Albumin/Globulin Ratio: 1.5 (ref 1.2–2.2)
Albumin: 3.7 g/dL (ref 3.5–4.8)
Alkaline Phosphatase: 86 IU/L (ref 39–117)
BUN/Creatinine Ratio: 7 — ABNORMAL LOW (ref 12–28)
BUN: 5 mg/dL — ABNORMAL LOW (ref 8–27)
Bilirubin Total: 0.4 mg/dL (ref 0.0–1.2)
CO2: 27 mmol/L (ref 18–29)
Calcium: 8.6 mg/dL — ABNORMAL LOW (ref 8.7–10.3)
Chloride: 100 mmol/L (ref 96–106)
Creatinine, Ser: 0.69 mg/dL (ref 0.57–1.00)
GFR calc Af Amer: 101 mL/min/{1.73_m2} (ref >59)
GFR calc non Af Amer: 88 mL/min/{1.73_m2} (ref >59)
Globulin, Total: 2.4 g/dL (ref 1.5–4.5)
Glucose: 84 mg/dL (ref 65–99)
Potassium: 2.1 mmol/L — CL (ref 3.5–5.2)
Sodium: 144 mmol/L (ref 134–144)
Total Protein: 6.1 g/dL (ref 6.0–8.5)

## 2016-12-14 LAB — CBC WITH DIFFERENTIAL/PLATELET
Basophils Absolute: 0 10*3/uL (ref 0.0–0.2)
Basos: 1 %
EOS (ABSOLUTE): 0.2 10*3/uL (ref 0.0–0.4)
Eos: 5 %
Hematocrit: 28.4 % — ABNORMAL LOW (ref 34.0–46.6)
Hemoglobin: 9.5 g/dL — ABNORMAL LOW (ref 11.1–15.9)
Immature Grans (Abs): 0 10*3/uL (ref 0.0–0.1)
Immature Granulocytes: 0 %
Lymphocytes Absolute: 1.1 10*3/uL (ref 0.7–3.1)
Lymphs: 33 %
MCH: 30.7 pg (ref 26.6–33.0)
MCHC: 33.5 g/dL (ref 31.5–35.7)
MCV: 92 fL (ref 79–97)
Monocytes Absolute: 0.4 10*3/uL (ref 0.1–0.9)
Monocytes: 12 %
Neutrophils Absolute: 1.6 10*3/uL (ref 1.4–7.0)
Neutrophils: 49 %
Platelets: 324 10*3/uL (ref 150–379)
RBC: 3.09 x10E6/uL — ABNORMAL LOW (ref 3.77–5.28)
RDW: 14 % (ref 12.3–15.4)
WBC: 3.2 10*3/uL — ABNORMAL LOW (ref 3.4–10.8)

## 2016-12-14 LAB — LIPID PANEL
Chol/HDL Ratio: 4.4 ratio units (ref 0.0–4.4)
Cholesterol, Total: 239 mg/dL — ABNORMAL HIGH (ref 100–199)
HDL: 54 mg/dL (ref >39)
LDL Calculated: 171 mg/dL — ABNORMAL HIGH (ref 0–99)
Triglycerides: 69 mg/dL (ref 0–149)
VLDL Cholesterol Cal: 14 mg/dL (ref 5–40)

## 2016-12-14 LAB — TSH: TSH: 0.35 u[IU]/mL — ABNORMAL LOW (ref 0.450–4.500)

## 2016-12-14 MED ORDER — POTASSIUM CHLORIDE CRYS ER 20 MEQ PO TBCR: 40.0000 meq | EXTENDED_RELEASE_TABLET | Freq: Three times a day (TID) | ORAL | 0 refills | Status: DC

## 2016-12-14 NOTE — Telephone Encounter (Signed)
I spoke with [name] about her low serum potassium. She reports not having taken her potassium as "I should" because of being busy. She denies palpitations, syncope, near-syncope, weakness, excessive fatigue.  Recommended that she take her klorcon 20 meq tablets, two tablets tid with food for next 4 days.  I asked that she come into [facility] on Monday 12/17/16 for repeat BMET and serum Magnesium blood draw.   Advised her that should she experience any of symptoms listed above to go to th ED immediately for evaluation.

## 2016-12-17 ENCOUNTER — Other Ambulatory Visit: Payer: Medicare Other

## 2016-12-17 DIAGNOSIS — E0590 Thyrotoxicosis, unspecified without thyrotoxic crisis or storm: Secondary | ICD-10-CM

## 2016-12-17 DIAGNOSIS — R634 Abnormal weight loss: Secondary | ICD-10-CM

## 2016-12-17 DIAGNOSIS — E876 Hypokalemia: Secondary | ICD-10-CM | POA: Diagnosis not present

## 2016-12-17 NOTE — Addendum Note (Signed)
Addended byPerley Jain, Soundra Lampley D on: 12/17/2016 09:30 AM   Modules accepted: Orders

## 2016-12-18 LAB — BASIC METABOLIC PANEL
BUN/Creatinine Ratio: 8 — ABNORMAL LOW (ref 12–28)
BUN: 5 mg/dL — ABNORMAL LOW (ref 8–27)
CO2: 23 mmol/L (ref 18–29)
Calcium: 9 mg/dL (ref 8.7–10.3)
Chloride: 105 mmol/L (ref 96–106)
Creatinine, Ser: 0.59 mg/dL (ref 0.57–1.00)
GFR calc Af Amer: 107 mL/min/{1.73_m2} (ref >59)
GFR calc non Af Amer: 93 mL/min/{1.73_m2} (ref >59)
Glucose: 78 mg/dL (ref 65–99)
Potassium: 3.6 mmol/L (ref 3.5–5.2)
Sodium: 144 mmol/L (ref 134–144)

## 2016-12-18 LAB — MAGNESIUM: Magnesium: 1.8 mg/dL (ref 1.6–2.3)

## 2016-12-18 LAB — T4, FREE: Free T4: 1.21 ng/dL (ref 0.82–1.77)

## 2016-12-18 LAB — TSH: TSH: 0.429 u[IU]/mL — ABNORMAL LOW (ref 0.450–4.500)

## 2016-12-19 DIAGNOSIS — E0590 Thyrotoxicosis, unspecified without thyrotoxic crisis or storm: Secondary | ICD-10-CM | POA: Insufficient documentation

## 2016-12-19 HISTORY — DX: Thyrotoxicosis, unspecified without thyrotoxic crisis or storm: E05.90

## 2016-12-19 NOTE — Assessment & Plan Note (Signed)
New problem Will check FT3 next lab draw.

## 2016-12-20 ENCOUNTER — Telehealth: Payer: Self-pay

## 2016-12-20 NOTE — Telephone Encounter (Signed)
Agree or go back to other pharmacy.

## 2016-12-20 NOTE — Telephone Encounter (Signed)
Patient calling inform Dr. Katrinka Blazing and his nurse that she recently had her Atenolol RX [facility] [address] which is closer to her home.She states the pharmacy told her that they get there Atenolol from a different manufacturer then [facility]  [address]. The "new manufacturer" Atenolol she is currently taking is causing her to have PVC's for 20-30 min after she takes the medicine which is a symptom that she was not experiencing before. I told her she should contact the [facility] [address] to find out what manufacturer they get their atenolol from and see if [facility] [address] can find the same medication. I told her that I would forward her concerns to Dr. Katrinka Blazing and his nurse for advisory. She was agreeable to plan

## 2016-12-21 NOTE — Addendum Note (Signed)
Addended byPerley Jain, Areli Jowett D on: 12/21/2016 08:30 AM   Modules accepted: Orders

## 2016-12-21 NOTE — Progress Notes (Signed)
Serum potassium up to 3.6 from 2.1.   Please contact [name] to let her know her serum potassium is back in the normal range.  She should take two tablets of her potassium every day to keep her serum potassium from falling again.

## 2016-12-22 NOTE — Progress Notes (Deleted)
Cardiology Office Note    Date:  12/22/2016   ID:  [name], DOB [birth year], MRN [id number]  PCP:  Etta Grandchild, MD  Cardiologist: Lesleigh Noe, MD   No chief complaint on file.   History of Present Illness:  [name] is a 72 y.o. female with a history of PVCs, HLD, HTN, incidental finding of 3V CAD by CT scan (2014), recent chest and neck pain low risk Myoview January 2018.    Past Medical History:  Diagnosis Date  . Adjustment disorder with mixed anxiety and depressed mood 10/28/2014  . ANAL FISSURE, HX OF 11/17/2009   Qualifier: Diagnosis of  By: McDiarmid MD, Tawanna Cooler    . Anemia   . ANEMIA, MILD, HX OF 11/21/2009   Qualifier: Diagnosis of  By: McDiarmid MD, Tawanna Cooler    . 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 DEPRESSION, HX OF 11/17/2009   Qualifier: Diagnosis of  By: McDiarmid MD, Tawanna Cooler    . 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 GASTROESOPHAGEAL REFLUX DISEASE 11/17/2009   Qualifier: Diagnosis of  By: McDiarmid MD, Tawanna Cooler    . HIATAL HERNIA 07/08/1997   Annotation: Dx on abdominal ultrasound Qualifier: Diagnosis of  By: McDiarmid MD, Tawanna Cooler    . History of colon polyps 11/20/2010   Dx in 2008 by Dr Charna Elizabeth.    . History of pneumonia 05/17/2011  . History of pneumonia 05/17/2011   Infiltrate developed on CXR with fever during hospitalization 05/15/11 for N/V secondary to to Eosinophilic gastroenteritis.  Suspect Aspiration pneumonitis.    . Hyperlipidemia 09/24/2014  . Hyperthyroidism, subclinical 12/19/2016  . Hypokalemia 08/03/2015  . HYPOKALEMIA, HX OF 11/17/2009   Presumed secondary to lymphocytic colitis.    . Irregular heart beat   . Jaw pain 10/28/2014  . 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 Moderate obesity 11/17/2009   Qualifier: Diagnosis of  By: McDiarmid MD, Tawanna Cooler    . OPHTHALMIC MIGRAINES, HX OF  06/08/2008   Qualifier: Diagnosis of  By: McDiarmid MD, Tawanna Cooler    . Osteopenia 12/14/2014   T socre lumbar in 2015 = (-) 1.8. FRAX 5% major fracture 10 year risk.  0.5% 10 year risk hip fracture.    . Paresthesia of lower limb 01/16/2011  . Personal history of other disorders of nervous system and sense organs 11/17/2009   Centricity Description: BENIGN POSITIONAL VERTIGO, HX OF Qualifier: Diagnosis of  By: McDiarmid MD, Tawanna Cooler   Centricity Description: CARPAL TUNNEL SYNDROME, BILATERAL, HX OF Qualifier: History of  By: McDiarmid MD, Tawanna Cooler    . Premature ventricular contractions 11/17/2009   Qualifier: Diagnosis of  By: McDiarmid MD, Tawanna Cooler    . 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 Right ankle swelling 04/06/2015  . Right knee pain 04/29/2014  . SINUSITIS 11/17/2009   Qualifier: History of  By: McDiarmid MD, Tawanna Cooler    . TRIGGER FINGER, RIGHT THUMB 05/09/2001   Qualifier: History of  By: McDiarmid MD, Tawanna Cooler    . TROCHANTERIC BURSITIS, RIGHT 11/17/2009   Qualifier: History of  By: McDiarmid MD, Tawanna Cooler      Past Surgical History:  Procedure Laterality Date  . ABDOMINAL HYSTERECTOMY  1988   partial  . APPENDECTOMY    . BIOPSY BREAST  09/2001  . BREAST SURGERY     extraction  . CARDIAC CATHETERIZATION  12/2006   Dr Lacretia Nicks. Elsie Lincoln. No obstruction.  Normal LV  . CARPAL TUNNEL RELEASE     bilateral wrists.   . COLONOSCOPY W/ BIOPSIES  09/2006   Dx withDr Arty Baumgartner (GI): Dx with  Lymphocytic Colitis on biopsies. No polyps or masses.   Marland Kitchen DILATION AND CURETTAGE OF UTERUS  1988  . EYE SURGERY     terygium  . GUM SURGERY    . HERNIA REPAIR     umbilical, repaired twice.   . TONSILLECTOMY    . TRIGGER FINGER RELEASE     Release of stenosising tenosynovitis of thumbs  . TUBAL LIGATION  1975  . UGI Barium  1999   normal  . Ultrasound of gall bladder with CCK-injection  1999   normal    Current Medications: Outpatient Medications Prior to Visit  Medication Sig Dispense Refill  . aspirin 81 MG tablet Take 1  tablet (81 mg total) by mouth daily. 30 tablet   . atenolol (TENORMIN) 50 MG tablet TAKE 1 TABLET BY MOUTH EVERY DAY 90 tablet 2  . Calcium Carb-Cholecalciferol (CALCIUM 600 + D PO) Take 1 tablet by mouth daily.     . Coenzyme Q10 (COQ-10) 50 MG CAPS Take 1 capsule by mouth 2 (two) times daily.    . COLLAGEN PO Take 1 capsule by mouth daily.     Marland Kitchen EVENING PRIMROSE OIL PO Take 1 capsule by mouth daily.    . famotidine-calcium carbonate-magnesium hydroxide (PEPCID COMPLETE) 10-800-165 MG CHEW chewable tablet Chew 1 tablet by mouth daily as needed (heartburn/acid reflux).     Marland Kitchen loperamide (IMODIUM A-D) 2 MG tablet Take 1 tablet (2 mg total) by mouth 4 (four) times daily as needed for diarrhea or loose stools. 30 tablet 0  . Misc Natural Products (GRAPE SEED COMPLEX PO) Take 1 capsule by mouth 2 (two) times daily.    . Multiple Vitamin (MULTIVITAMIN WITH MINERALS) TABS tablet Take 1 tablet by mouth daily.    . Omega-3 Fatty Acids (FISH OIL PO) Take 1 capsule by mouth 2 (two) times daily.     . potassium chloride SA (KLOR-CON M20) 20 MEQ tablet Take 2 tablets (40 mEq total) by mouth daily. 360 tablet 0  . Probiotic Product (PROBIOTIC DAILY PO) Take 1 tablet by mouth daily.    . Red Yeast Rice 600 MG CAPS Take 1 capsule by mouth 2 (two) times daily.      No facility-administered medications prior to visit.      Allergies:   Cefuroxime; Azithromycin; Contrast media [iodinated diagnostic agents]; Oxycodone; Phenergan [promethazine hcl]; Shellfish allergy; Statins; Amoxicillin; and Tramadol   Social History   Social History  . Marital status: Legally Separated    Spouse name: N/A  . Number of children: 3  . Years of education: 12   Occupational History  . retired [employer]   Social History Main Topics  . Smoking status: Never Smoker  . Smokeless tobacco: Never Used  . Alcohol use 0.5 oz/week    1 drink(s) per week  . Drug use: No  . Sexual activity: Not on file    Other Topics Concern  . Not on file   Social History Narrative   Divorced.  Ex-Husband Cheral Almas   3 grown children   5 brothers and 3 sisters   Occupation: retired, previously an Manufacturing engineer at [employer]   No pets   Education: college   Heterosexual   Owns  a car   Patient's Cell phone [phone]   Renting home   Never Smoked   Drug use-no   Alcohol use: drinks infrequently   Exercise: No regular exercise routine   Always wears seatbelts   No hx of STD   Sun exposure: rarely   Religion: Jehovah's Witness - NO BLOOD PRODUCTS                                               Family History:  The patient's ***family history includes Cancer in her mother; Hearing loss in her mother; Heart disease in her father; Stroke in her brother and father.   ROS:   Please see the history of present illness.    ***  All other systems reviewed and are negative.   PHYSICAL EXAM:   VS:  There were no vitals taken for this visit.   GEN: Well nourished, well developed, in no acute distress  HEENT: normal  Neck: no JVD, carotid bruits, or masses Cardiac: ***RRR; no murmurs, rubs, or gallops,no edema  Respiratory:  clear to auscultation bilaterally, normal work of breathing GI: soft, nontender, nondistended, + BS MS: no deformity or atrophy  Skin: warm and dry, no rash Neuro:  Alert and Oriented x 3, Strength and sensation are intact Psych: euthymic mood, full affect  Wt Readings from Last 3 Encounters:  11/29/16 160 lb (72.6 kg)  10/09/16 161 lb (73 kg)  10/02/16 161 lb 3.2 oz (73.1 kg)      Studies/Labs Reviewed:   EKG:  EKG  ***  Recent Labs: 12/13/2016: ALT 9; Platelets 324 12/17/2016: BUN 5; Creatinine, Ser 0.59; Magnesium 1.8; Potassium 3.6; Sodium 144; TSH 0.429   Lipid Panel    Component Value Date/Time   CHOL 239 (H) 12/13/2016 1024   TRIG 69 12/13/2016 1024   HDL 54 12/13/2016 1024   CHOLHDL 4.4 12/13/2016 1024   CHOLHDL 4.1 11/03/2015  0922   VLDL 11 11/03/2015 0922   LDLCALC 171 (H) 12/13/2016 1024   LDLDIRECT 119 06/21/2015 0947    Additional studies/ records that were reviewed today include:  ***    ASSESSMENT:    1. Coronary artery calcification seen on CAT scan   2. Essential hypertension   3. Premature ventricular contractions   4. Pure hypercholesterolemia      PLAN:  In order of problems listed above:  1. ***    Medication Adjustments/Labs and Tests Ordered: Current medicines are reviewed at length with the patient today.  Concerns regarding medicines are outlined above.  Medication changes, Labs and Tests ordered today are listed in the Patient Instructions below. There are no Patient Instructions on file for this visit.   Signed, Lesleigh Noe, MD  12/22/2016 3:10 PM    [facility] [address] [place], [place], [place]  [address] Phone: [phone]; Fax: [phone]

## 2016-12-24 ENCOUNTER — Ambulatory Visit: Payer: Medicare Other | Admitting: Interventional Cardiology

## 2017-01-09 NOTE — Progress Notes (Signed)
Cardiology Office Note    Date:  01/10/2017   ID:  Madison, Spencer Jan 07, 1946, MRN [id number]  PCP:  Madison Grandchild, MD  Cardiologist: Lesleigh Noe, MD   Chief Complaint  Patient presents with  . Hospitalization Follow-up    HBP    History of Present Illness:  [name] is a 71 y.o. 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  No cardiac complaints. A CT scan demonstrated calcification. Stress Cardiolite has been performed and does not demonstrate any evidence of ischemia as recently as January 2018. She denies dyspnea.  Past Medical History:  Diagnosis Date  . Adjustment disorder with mixed anxiety and depressed mood 10/28/2014  . ANAL FISSURE, HX OF 11/17/2009   Qualifier: Diagnosis of  By: McDiarmid MD, Tawanna Cooler    . Anemia   . ANEMIA, MILD, HX OF 11/21/2009   Qualifier: Diagnosis of  By: McDiarmid MD, Tawanna Cooler    . 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 DEPRESSION, HX OF 11/17/2009   Qualifier: Diagnosis of  By: McDiarmid MD, Tawanna Cooler    . 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 GASTROESOPHAGEAL REFLUX DISEASE 11/17/2009   Qualifier: Diagnosis of  By: McDiarmid MD, Tawanna Cooler    . HIATAL HERNIA 07/08/1997   Annotation: Dx on abdominal ultrasound Qualifier: Diagnosis of  By: McDiarmid MD, Tawanna Cooler    . History of colon polyps 11/20/2010   Dx in 2008 by Dr Charna Elizabeth.    . History of pneumonia 05/17/2011  . History of pneumonia 05/17/2011   Infiltrate developed on CXR with fever during hospitalization 05/15/11 for N/V secondary to to Eosinophilic gastroenteritis.  Suspect Aspiration pneumonitis.    . Hyperlipidemia 09/24/2014  . Hyperthyroidism, subclinical 12/19/2016  . Hypokalemia 08/03/2015  . HYPOKALEMIA, HX OF 11/17/2009   Presumed secondary to lymphocytic colitis.    . Irregular heart beat   . Jaw pain 10/28/2014  . 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 Moderate obesity 11/17/2009   Qualifier: Diagnosis of  By: McDiarmid MD, Tawanna Cooler    . OPHTHALMIC MIGRAINES, HX OF 06/08/2008   Qualifier: Diagnosis of  By: McDiarmid MD, Tawanna Cooler    . Osteopenia 12/14/2014   T socre lumbar in 2015 = (-) 1.8. FRAX 5% major fracture 10 year risk.  0.5% 10 year risk hip fracture.    . Paresthesia of lower limb 01/16/2011  . Personal history of other disorders of nervous system and sense organs 11/17/2009   Centricity Description: BENIGN POSITIONAL VERTIGO, HX OF Qualifier: Diagnosis of  By: McDiarmid MD, Tawanna Cooler   Centricity Description: CARPAL TUNNEL SYNDROME, BILATERAL, HX OF Qualifier: History of  By: McDiarmid MD, Tawanna Cooler    . Premature ventricular contractions 11/17/2009   Qualifier: Diagnosis of  By: McDiarmid MD, Tawanna Cooler    . 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 Right ankle swelling 04/06/2015  . Right knee pain 04/29/2014  . SINUSITIS 11/17/2009   Qualifier: History of  By: McDiarmid MD, Tawanna Cooler    . TRIGGER FINGER, RIGHT THUMB 05/09/2001   Qualifier: History of  By: McDiarmid MD, Tawanna Cooler    . TROCHANTERIC BURSITIS, RIGHT 11/17/2009   Qualifier: History of  By: McDiarmid MD, Tawanna Cooler      Past Surgical History:  Procedure Laterality Date  . ABDOMINAL HYSTERECTOMY  1988   partial  . APPENDECTOMY    . BIOPSY BREAST  09/2001  . BREAST SURGERY     extraction  . CARDIAC CATHETERIZATION  12/2006   Dr Lacretia Nicks. Elsie Lincoln. No obstruction.  Normal LV  . CARPAL TUNNEL RELEASE     bilateral wrists.   . COLONOSCOPY W/ BIOPSIES  09/2006   Dx withDr Arty Baumgartner (GI): Dx with  Lymphocytic Colitis on biopsies. No polyps or masses.   Marland Kitchen DILATION AND CURETTAGE OF UTERUS  1988  . EYE SURGERY     terygium  . GUM SURGERY    . HERNIA REPAIR     umbilical, repaired twice.   . TONSILLECTOMY    . TRIGGER FINGER RELEASE     Release of stenosising tenosynovitis of thumbs  . TUBAL  LIGATION  1975  . UGI Barium  1999   normal  . Ultrasound of gall bladder with CCK-injection  1999   normal    Current Medications: Outpatient Medications Prior to Visit  Medication Sig Dispense Refill  . aspirin 81 MG tablet Take 1 tablet (81 mg total) by mouth daily. 30 tablet   . atenolol (TENORMIN) 50 MG tablet TAKE 1 TABLET BY MOUTH EVERY DAY 90 tablet 2  . Coenzyme Q10 (COQ-10) 50 MG CAPS Take 1 capsule by mouth 2 (two) times daily. (with Grape Seed Complex)    . Multiple Vitamin (MULTIVITAMIN WITH MINERALS) TABS tablet Take 1 tablet by mouth daily.    . Omega-3 Fatty Acids (FISH OIL PO) Take 1 capsule by mouth 2 (two) times daily.     . potassium chloride SA (KLOR-CON M20) 20 MEQ tablet Take 40 mEq by mouth 2 (two) times daily.  360 tablet 0  . Red Yeast Rice 600 MG CAPS Take 1 capsule by mouth 2 (two) times daily.     . Calcium Carb-Cholecalciferol (CALCIUM 600 + D PO) Take 1 tablet by mouth daily.     . COLLAGEN PO Take 1 capsule by mouth daily.     Marland Kitchen EVENING PRIMROSE OIL PO Take 1 capsule by mouth daily.    . famotidine-calcium carbonate-magnesium hydroxide (PEPCID COMPLETE) 10-800-165 MG CHEW chewable tablet Chew 1 tablet by mouth daily as needed (heartburn/acid reflux).     Marland Kitchen loperamide (IMODIUM A-D) 2 MG tablet Take 1 tablet (2 mg total) by mouth 4 (four) times daily as needed for diarrhea or loose stools. (Patient not taking: Reported on 01/10/2017) 30 tablet 0  . Misc Natural Products (GRAPE SEED COMPLEX PO) Take 1 capsule by mouth 2 (two) times daily.    . Probiotic Product (PROBIOTIC DAILY PO) Take 1 tablet by mouth daily.     No facility-administered medications prior to visit.      Allergies:   Cefuroxime; Azithromycin; Contrast media [iodinated diagnostic agents]; Oxycodone; Phenergan [promethazine hcl]; Shellfish allergy; Statins; Amoxicillin; and Tramadol   Social History   Social History  . Marital status: Legally Separated    Spouse name: N/A  . Number of  children: 3  . Years of education: 12   Occupational History  . retired [employer]   Social History Main Topics  . Smoking status: Never Smoker  . Smokeless tobacco: Never Used  . Alcohol use 0.5 oz/week    1 drink(s) per week  . Drug use: No  . Sexual activity: Not Asked   Other Topics Concern  . None   Social History Narrative  Divorced.  Ex-Husband Cheral Almas   3 grown children   5 brothers and 3 sisters   Occupation: retired, previously an Manufacturing engineer at [employer]   No pets   Education: college   Heterosexual   Owns a car   Patient's Cell phone [phone]   Renting home   Never Smoked   Drug use-no   Alcohol use: drinks infrequently   Exercise: No regular exercise routine   Always wears seatbelts   No hx of STD   Sun exposure: rarely   Religion: Jehovah's Witness - NO BLOOD PRODUCTS                                               Family History:  The patient's family history includes Cancer in her mother; Hearing loss in her mother; Heart disease in her father; Stroke in her brother and father.   ROS:   Please see the history of present illness.    Dyspnea on exertion irregular heartbeat fatigue appetite change fluctuating weight sometimes 5 much is 10 pounds weak over week. Diarrhea and depression. Dizziness and easy bruising. Back pain, joint swelling, difficulty with balance, and headaches.  All other systems reviewed and are negative.   PHYSICAL EXAM:   VS:  BP (!) 170/76 (BP Location: Left Arm)   Pulse 65   Ht  (1.626 m)   Wt 158 lb 12.8 oz (72 kg)   BMI 27.26 kg/m    GEN: Well nourished, well developed, in no acute distress  HEENT: normal  Neck: no JVD, carotid bruits, or masses Cardiac: RRR; no murmurs, rubs, or gallops,no edema  Respiratory:  clear to auscultation bilaterally, normal work of breathing GI: soft, nontender, nondistended, + BS MS: no deformity or atrophy  Skin: warm and dry, no  rash Neuro:  Alert and Oriented x 3, Strength and sensation are intact Psych: euthymic mood, full affect  Wt Readings from Last 3 Encounters:  01/10/17 158 lb 12.8 oz (72 kg)  11/29/16 160 lb (72.6 kg)  10/09/16 161 lb (73 kg)      Studies/Labs Reviewed:   EKG:  EKG  Not repeated  Recent Labs: 12/13/2016: ALT 9; Platelets 324 12/17/2016: BUN 5; Creatinine, Ser 0.59; Magnesium 1.8; Potassium 3.6; Sodium 144; TSH 0.429   Lipid Panel    Component Value Date/Time   CHOL 239 (H) 12/13/2016 1024   TRIG 69 12/13/2016 1024   HDL 54 12/13/2016 1024   CHOLHDL 4.4 12/13/2016 1024   CHOLHDL 4.1 11/03/2015 0922   VLDL 11 11/03/2015 0922   LDLCALC 171 (H) 12/13/2016 1024   LDLDIRECT 119 06/21/2015 0947    Additional studies/ records that were reviewed today include:  Stress nuclear study January 2018: Study Highlights     Nuclear stress EF: 78%.  ST segment depression was noted during stress in the V4 and V5 leads.  Defect 1: There is a medium defect of mild severity present in the basal inferior and mid inferior location.  This is a low risk study.  The left ventricular ejection fraction is hyperdynamic (>65%).      ASSESSMENT:    1. Coronary artery calcification seen on CAT scan   2. Essential hypertension   3. Family history of intracranial aneurysms   4. Premature ventricular contractions   5. Other hyperlipidemia      PLAN:  In order of problems listed above:  1. No ischemia noted on myocardial scintigraphy done in January. 2. Elevated. 2 g sodium diet. Tarry at 140/90 mmHg or less. Record blood pressures over the next several weeks and call results. May need up titration of therapy. 3. Not addressed 4. Not currently present a problem. 5. LDL cholesterol is extremely elevated but she refuses therapy with statins.  Monitor blood pressure. 2 g sodium diet. Weight loss. Monitor blood pressure over the next 2-3 weeks. Medication adjustment may be necessary.  One-year follow-up.  Medication Adjustments/Labs and Tests Ordered: Current medicines are reviewed at length with the patient today.  Concerns regarding medicines are outlined above.  Medication changes, Labs and Tests ordered today are listed in the Patient Instructions below. Patient Instructions  Medication Instructions:  None  Labwork: None  Testing/Procedures: None  Follow-Up: Your physician wants you to follow-up in: 1 year with Dr. Katrinka Blazing. You will receive a reminder letter in the mail two months in advance. If you don't receive a letter, please call our office to schedule the follow-up appointment.   Any Other Special Instructions Will Be Listed Below (If Applicable).  Monitor your blood pressure and call us in about 2 weeks with those readings.  Your target is 140/90 or less.    If you need a refill on your cardiac medications before your next appointment, please call your pharmacy.      Signed, Lesleigh Noe, MD  01/10/2017 2:41 PM    [facility] [address] [place], [place], [place]  [address] Phone: [phone]; Fax: [phone]

## 2017-01-10 ENCOUNTER — Encounter: Payer: Self-pay | Admitting: Interventional Cardiology

## 2017-01-10 ENCOUNTER — Ambulatory Visit (INDEPENDENT_AMBULATORY_CARE_PROVIDER_SITE_OTHER): Payer: Medicare Other | Admitting: Interventional Cardiology

## 2017-01-10 VITALS — BP 170/76 | HR 65 | Ht 64.0 in | Wt 158.8 lb

## 2017-01-10 DIAGNOSIS — E784 Other hyperlipidemia: Secondary | ICD-10-CM

## 2017-01-10 DIAGNOSIS — I493 Ventricular premature depolarization: Secondary | ICD-10-CM

## 2017-01-10 DIAGNOSIS — I10 Essential (primary) hypertension: Secondary | ICD-10-CM

## 2017-01-10 DIAGNOSIS — Z8249 Family history of ischemic heart disease and other diseases of the circulatory system: Secondary | ICD-10-CM

## 2017-01-10 DIAGNOSIS — E7849 Other hyperlipidemia: Secondary | ICD-10-CM

## 2017-01-10 DIAGNOSIS — I2510 Atherosclerotic heart disease of native coronary artery without angina pectoris: Secondary | ICD-10-CM

## 2017-01-10 NOTE — Patient Instructions (Signed)
Medication Instructions:  None  Labwork: None  Testing/Procedures: None  Follow-Up: Your physician wants you to follow-up in: 1 year with Dr. Katrinka Blazing. You will receive a reminder letter in the mail two months in advance. If you don't receive a letter, please call our office to schedule the follow-up appointment.   Any Other Special Instructions Will Be Listed Below (If Applicable).  Monitor your blood pressure and call us in about 2 weeks with those readings.  Your target is 140/90 or less.    If you need a refill on your cardiac medications before your next appointment, please call your pharmacy.

## 2017-01-28 ENCOUNTER — Ambulatory Visit: Payer: Medicare Other | Admitting: Interventional Cardiology

## 2017-01-31 ENCOUNTER — Telehealth: Payer: Self-pay | Admitting: Interventional Cardiology

## 2017-01-31 NOTE — Telephone Encounter (Signed)
Walk In pt Form-BP Readings dropped off-placed in [place] box.

## 2017-02-01 DIAGNOSIS — H2512 Age-related nuclear cataract, left eye: Secondary | ICD-10-CM | POA: Diagnosis not present

## 2017-02-01 DIAGNOSIS — H353131 Nonexudative age-related macular degeneration, bilateral, early dry stage: Secondary | ICD-10-CM | POA: Diagnosis not present

## 2017-02-01 DIAGNOSIS — H1851 Endothelial corneal dystrophy: Secondary | ICD-10-CM | POA: Diagnosis not present

## 2017-02-01 DIAGNOSIS — H26491 Other secondary cataract, right eye: Secondary | ICD-10-CM | POA: Diagnosis not present

## 2017-02-01 DIAGNOSIS — H35371 Puckering of macula, right eye: Secondary | ICD-10-CM | POA: Diagnosis not present

## 2017-02-04 ENCOUNTER — Telehealth: Payer: Self-pay | Admitting: Interventional Cardiology

## 2017-02-04 DIAGNOSIS — I10 Essential (primary) hypertension: Secondary | ICD-10-CM

## 2017-02-04 MED ORDER — HYDROCHLOROTHIAZIDE 12.5 MG PO CAPS: 12.5000 mg | ORAL_CAPSULE | Freq: Every day | ORAL | 3 refills | Status: DC

## 2017-02-04 NOTE — Telephone Encounter (Signed)
Pt dropped off BP readings for Dr. Katrinka BlazingSmith to review.  Dr. Katrinka BlazingSmith reviewed and recommended pt start HCTZ 12.5mg  once daily and to come back for lab work in 7-10 days. Pt verbalized understanding and was in agreement with this plan. Pt will come for labs on 5-31/18.

## 2017-02-12 ENCOUNTER — Telehealth: Payer: Self-pay | Admitting: Interventional Cardiology

## 2017-02-14 ENCOUNTER — Other Ambulatory Visit: Payer: Medicare Other

## 2017-02-15 NOTE — Telephone Encounter (Signed)
Pt states since starting HCTZ she has been more fatigued and just generally "feels bad".  States BPs have improved.   5/26- 129/70 5/27- 125/87 5/28-  108/73 Denies any other sx, denies swelling.  Advised I would send message to Dr. Katrinka BlazingSmith for review and advisement.

## 2017-02-15 NOTE — Telephone Encounter (Signed)
Spoke with pt and she's in agreement to keep appt for labs on Monday.  Pt appreciative for call.

## 2017-02-15 NOTE — Telephone Encounter (Signed)
New message        Pt c/o medication issue:  1. Name of Medication: HCTZ 2. How are you currently taking this medication (dosage and times per day)? 12.5 daily 3. Are you having a reaction (difficulty breathing--STAT)? no 4. What is your medication issue? Medication is making pt dizzy and weak.  She has felt bad since starting it.  Please call

## 2017-02-15 NOTE — Telephone Encounter (Signed)
Check BMET 

## 2017-02-18 ENCOUNTER — Other Ambulatory Visit: Payer: Medicare Other | Admitting: *Deleted

## 2017-02-18 DIAGNOSIS — I10 Essential (primary) hypertension: Secondary | ICD-10-CM | POA: Diagnosis not present

## 2017-02-19 ENCOUNTER — Other Ambulatory Visit: Payer: Medicare Other | Admitting: *Deleted

## 2017-02-19 ENCOUNTER — Telehealth: Payer: Self-pay | Admitting: Interventional Cardiology

## 2017-02-19 ENCOUNTER — Telehealth: Payer: Self-pay | Admitting: Cardiology

## 2017-02-19 DIAGNOSIS — E876 Hypokalemia: Secondary | ICD-10-CM | POA: Diagnosis not present

## 2017-02-19 LAB — BASIC METABOLIC PANEL
BUN/Creatinine Ratio: 15 (ref 12–28)
BUN/Creatinine Ratio: 17 (ref 12–28)
BUN: 16 mg/dL (ref 8–27)
BUN: 17 mg/dL (ref 8–27)
CO2: 26 mmol/L (ref 18–29)
CO2: 29 mmol/L (ref 18–29)
Calcium: 9 mg/dL (ref 8.7–10.3)
Calcium: 9.2 mg/dL (ref 8.7–10.3)
Chloride: 95 mmol/L — ABNORMAL LOW (ref 96–106)
Chloride: 98 mmol/L (ref 96–106)
Creatinine, Ser: 1.07 mg/dL — ABNORMAL HIGH (ref 0.57–1.00)
Creatinine, Ser: 1 mg/dL (ref 0.57–1.00)
GFR calc Af Amer: 60 mL/min/{1.73_m2} (ref >59)
GFR calc Af Amer: 65 mL/min/{1.73_m2} (ref >59)
GFR calc non Af Amer: 52 mL/min/{1.73_m2} — ABNORMAL LOW (ref >59)
GFR calc non Af Amer: 57 mL/min/{1.73_m2} — ABNORMAL LOW (ref >59)
Glucose: 94 mg/dL (ref 65–99)
Glucose: 96 mg/dL (ref 65–99)
Potassium: 2.4 mmol/L — CL (ref 3.5–5.2)
Potassium: 2.5 mmol/L — ABNORMAL LOW (ref 3.5–5.2)
Sodium: 139 mmol/L (ref 134–144)
Sodium: 141 mmol/L (ref 134–144)

## 2017-02-19 NOTE — Telephone Encounter (Signed)
[facility] call stating that patient's potassium was 2.4 and creatinine 1.07.  Patient was supposed to be taking potassium  BID but says that her PCP told her to only take BID .  Instructed her to take potassium now and then repeat with at 9am.  Then come in for a repeat stat BMET at 12 noon.  Further potassium instructions based of those lab results.  Patient repeated instructions back to me and understands.

## 2017-02-19 NOTE — Addendum Note (Signed)
Addended by: Julio SicksBOWERS, JENNIFER L on: 02/19/2017 07:51 AM   Modules accepted: Orders

## 2017-02-19 NOTE — Telephone Encounter (Signed)
Walk In Pt Form- patient has requested to be called-placed in Dr.Smith doc box.

## 2017-02-20 ENCOUNTER — Other Ambulatory Visit: Payer: Medicare Other

## 2017-02-20 ENCOUNTER — Telehealth: Payer: Self-pay | Admitting: Interventional Cardiology

## 2017-02-20 DIAGNOSIS — E876 Hypokalemia: Secondary | ICD-10-CM

## 2017-02-20 NOTE — Telephone Encounter (Signed)
Spoke with pt and advised her of lab results and recommendations per Dr. Katrinka BlazingSmith.  Pt verbalized understanding and was in agreement with this plan.  Pt will have labs drawn 02/27/17.

## 2017-02-20 NOTE — Telephone Encounter (Signed)
New message      Pt would like to know how she is suppose to take her Potassium

## 2017-02-27 ENCOUNTER — Other Ambulatory Visit: Payer: Medicare Other | Admitting: *Deleted

## 2017-02-27 DIAGNOSIS — E876 Hypokalemia: Secondary | ICD-10-CM | POA: Diagnosis not present

## 2017-02-28 ENCOUNTER — Telehealth: Payer: Self-pay | Admitting: *Deleted

## 2017-02-28 DIAGNOSIS — E876 Hypokalemia: Secondary | ICD-10-CM

## 2017-02-28 LAB — BASIC METABOLIC PANEL
BUN/Creatinine Ratio: 11 — ABNORMAL LOW (ref 12–28)
BUN: 11 mg/dL (ref 8–27)
CO2: 25 mmol/L (ref 20–29)
Calcium: 9.1 mg/dL (ref 8.7–10.3)
Chloride: 106 mmol/L (ref 96–106)
Creatinine, Ser: 1.03 mg/dL — ABNORMAL HIGH (ref 0.57–1.00)
GFR calc Af Amer: 63 mL/min/{1.73_m2} (ref >59)
GFR calc non Af Amer: 55 mL/min/{1.73_m2} — ABNORMAL LOW (ref >59)
Glucose: 85 mg/dL (ref 65–99)
Potassium: 2.7 mmol/L — ABNORMAL LOW (ref 3.5–5.2)
Sodium: 144 mmol/L (ref 134–144)

## 2017-02-28 MED ORDER — POTASSIUM CHLORIDE CRYS ER 20 MEQ PO TBCR: 20.0000 meq | EXTENDED_RELEASE_TABLET | Freq: Two times a day (BID) | ORAL | 0 refills | Status: DC

## 2017-02-28 MED ORDER — SPIRONOLACTONE 25 MG PO TABS: 25.0000 mg | ORAL_TABLET | Freq: Every day | ORAL | 3 refills | Status: DC

## 2017-02-28 NOTE — Telephone Encounter (Signed)
-----  Message from Belva Crome, MD sent at 02/28/2017 12:38 PM EDT ----- Let the patient know the potassium is still low. We need to get her on the medication and will help keep it up. Spironolactone 25 mg per day. Continue K Dur 20 mEq twice a day. Repeat be met in 5-7 days. A copy will be sent to McDiarmid, Blane Ohara, MD

## 2017-02-28 NOTE — Addendum Note (Signed)
Addended by: Julio SicksBOWERS, JENNIFER L on: 02/28/2017 01:56 PM   Modules accepted: Orders

## 2017-02-28 NOTE — Telephone Encounter (Signed)
Spoke with pt and went over lab results and recommendations per Dr. Katrinka BlazingSmith.  Pt states she has been taking K+ 20mEq BID.  Advised her to continue this dose.  Sent prescription to preferred pharmacy.  Pt will come for labs on 6/21. Pt verbalized understanding and was in agreement with this plan.

## 2017-03-01 ENCOUNTER — Telehealth: Payer: Self-pay | Admitting: Interventional Cardiology

## 2017-03-01 NOTE — Telephone Encounter (Signed)
Spoke with pt and she was concerned about starting the Cleda DaubSpiro as she didn't realized it was a diuretic or that she needed one. Advised pt that we stopped HCTZ which is also a diuretic so she did need to take it.  Pt verbalized understanding and was in agreement with this plan.  Pt appreciative for call.

## 2017-03-01 NOTE — Telephone Encounter (Signed)
Patient calling, states that she has some questions about her new medication spironolactone. Thanks.

## 2017-03-07 ENCOUNTER — Other Ambulatory Visit: Payer: Medicare Other

## 2017-03-08 ENCOUNTER — Other Ambulatory Visit: Payer: Medicare Other | Admitting: *Deleted

## 2017-03-08 DIAGNOSIS — E876 Hypokalemia: Secondary | ICD-10-CM

## 2017-03-09 LAB — BASIC METABOLIC PANEL
BUN/Creatinine Ratio: 11 — ABNORMAL LOW (ref 12–28)
BUN: 10 mg/dL (ref 8–27)
CO2: 21 mmol/L (ref 20–29)
Calcium: 9.5 mg/dL (ref 8.7–10.3)
Chloride: 105 mmol/L (ref 96–106)
Creatinine, Ser: 0.89 mg/dL (ref 0.57–1.00)
GFR calc Af Amer: 75 mL/min/{1.73_m2} (ref >59)
GFR calc non Af Amer: 65 mL/min/{1.73_m2} (ref >59)
Glucose: 76 mg/dL (ref 65–99)
Potassium: 3 mmol/L — ABNORMAL LOW (ref 3.5–5.2)
Sodium: 141 mmol/L (ref 134–144)

## 2017-03-11 ENCOUNTER — Telehealth: Payer: Self-pay | Admitting: *Deleted

## 2017-03-11 DIAGNOSIS — E876 Hypokalemia: Secondary | ICD-10-CM

## 2017-03-11 MED ORDER — POTASSIUM CHLORIDE CRYS ER 20 MEQ PO TBCR: EXTENDED_RELEASE_TABLET | ORAL | 0 refills | Status: DC

## 2017-03-11 NOTE — Telephone Encounter (Signed)
Spoke with pt and went over recommendations and new orders per Dr. Katrinka BlazingSmith. Pt verbalized understanding and was in agreement with this plan.  Spoke with Katrina in the lab and she said [facility].  She will take requisition sheets down once ordered.  Spoke with pt and made her aware [facility] of coming into our office. Pt appreciative for call.

## 2017-03-11 NOTE — Telephone Encounter (Signed)
-----   Message from Lyn RecordsHenry W Smith, MD sent at 03/11/2017  1:21 PM EDT ----- Regarding: Low potassium Increase the K Dur to 60 meq daily, 40 AM and 20 PM. Needs BMET 7 days. Needs also serum cortisol, aldosterone, and urinary sodium and aldosterone ratio

## 2017-03-14 ENCOUNTER — Ambulatory Visit (INDEPENDENT_AMBULATORY_CARE_PROVIDER_SITE_OTHER): Payer: Medicare Other | Admitting: *Deleted

## 2017-03-14 ENCOUNTER — Encounter: Payer: Self-pay | Admitting: *Deleted

## 2017-03-14 VITALS — BP 136/64 | HR 61 | Temp 98.6°F | Ht 64.0 in | Wt 153.4 lb

## 2017-03-14 DIAGNOSIS — Z0000 Encounter for general adult medical examination without abnormal findings: Secondary | ICD-10-CM | POA: Diagnosis not present

## 2017-03-14 NOTE — Progress Notes (Signed)
Subjective:   [name] is a 71 y.o. female who presents for Medicare Annual (Subsequent) preventive examination.  Cardiac Risk Factors include: advanced age (>33men, >55 women);dyslipidemia;hypertension     Objective:     Vitals: BP 136/64 (BP Location: Left Arm, Patient Position: Sitting, Cuff Size: Normal)   Pulse 61   Temp 98.6 F (37 C) (Oral)   Ht 5\' 4"  (1.626 m)   Wt 153 lb 6.4 oz (69.6 kg)   SpO2 98%   BMI 26.33 kg/m   Body mass index is 26.33 kg/m.   Tobacco History  Smoking Status  . Never Smoker  Smokeless Tobacco  . Never Used     Patient has never smoked and has no plans to start.   Past Medical History:  Diagnosis Date  . Adjustment disorder with mixed anxiety and depressed mood 10/28/2014  . ANAL FISSURE, HX OF 11/17/2009   Qualifier: Diagnosis of  By: McDiarmid MD, Tawanna Cooler    . Anemia   . ANEMIA, MILD, HX OF 11/21/2009   Qualifier: Diagnosis of  By: McDiarmid MD, Tawanna Cooler    . 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 DEPRESSION, HX OF 11/17/2009   Qualifier: Diagnosis of  By: McDiarmid MD, Tawanna Cooler    . 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 GASTROESOPHAGEAL REFLUX DISEASE 11/17/2009   Qualifier: Diagnosis of  By: McDiarmid MD, Tawanna Cooler    . HIATAL HERNIA 07/08/1997   Annotation: Dx on abdominal ultrasound Qualifier: Diagnosis of  By: McDiarmid MD, Tawanna Cooler    . History of colon polyps 11/20/2010   Dx in 2008 by Dr Charna Elizabeth.    . History of pneumonia 05/17/2011  . History of pneumonia 05/17/2011   Infiltrate developed on CXR with fever during hospitalization 05/15/11 for N/V secondary to to Eosinophilic gastroenteritis.  Suspect Aspiration pneumonitis.    . Hyperlipidemia 09/24/2014  . Hyperthyroidism, subclinical 12/19/2016  . Hypokalemia 08/03/2015  . HYPOKALEMIA, HX OF 11/17/2009   Presumed secondary to  lymphocytic colitis.    . Irregular heart beat   . Jaw pain 10/28/2014  . 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 Moderate obesity 11/17/2009   Qualifier: Diagnosis of  By: McDiarmid MD, Tawanna Cooler    . OPHTHALMIC MIGRAINES, HX OF 06/08/2008   Qualifier: Diagnosis of  By: McDiarmid MD, Tawanna Cooler    . Osteopenia 12/14/2014   T socre lumbar in 2015 = (-) 1.8. FRAX 5% major fracture 10 year risk.  0.5% 10 year risk hip fracture.    . Paresthesia of lower limb 01/16/2011  . Personal history of other disorders of nervous system and sense organs 11/17/2009   Centricity Description: BENIGN POSITIONAL VERTIGO, HX OF Qualifier: Diagnosis of  By: McDiarmid MD, Tawanna Cooler   Centricity Description: CARPAL TUNNEL SYNDROME, BILATERAL, HX OF Qualifier: History of  By: McDiarmid MD, Tawanna Cooler    . Premature ventricular contractions 11/17/2009   Qualifier: Diagnosis of  By: McDiarmid MD, Tawanna Cooler    . 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 Right ankle swelling 04/06/2015  . Right knee pain 04/29/2014  . SINUSITIS 11/17/2009   Qualifier: History of  By: McDiarmid MD, Tawanna Cooler    . TRIGGER FINGER, RIGHT THUMB 05/09/2001   Qualifier: History of  By: McDiarmid MD, Tawanna Cooler    . TROCHANTERIC BURSITIS, RIGHT 11/17/2009   Qualifier: History of  By: McDiarmid MD,  Todd     Past Surgical History:  Procedure Laterality Date  . ABDOMINAL HYSTERECTOMY  1988   partial  . APPENDECTOMY    . BIOPSY BREAST  09/2001  . BREAST SURGERY     extraction  . CARDIAC CATHETERIZATION  12/2006   Dr Lacretia Nicks. [facility]. No obstruction.  Normal LV  . CARPAL TUNNEL RELEASE     bilateral wrists.   . COLONOSCOPY W/ BIOPSIES  09/2006   Dx withDr Arty Baumgartner (GI): Dx with  Lymphocytic Colitis on biopsies. No polyps or masses.   Marland Kitchen DILATION AND CURETTAGE OF UTERUS  1988  . EYE SURGERY     terygium  . GUM SURGERY    . HERNIA REPAIR     umbilical, repaired twice.   . TONSILLECTOMY    . TRIGGER FINGER RELEASE     Release of stenosising tenosynovitis of thumbs  . TUBAL LIGATION  1975  . UGI Barium   1999   normal  . Ultrasound of gall bladder with CCK-injection  1999   normal   Family History  Problem Relation Age of Onset  . Cancer Mother        Breast  . Hearing loss Mother   . Heart disease Father   . Stroke Father   . Heart attack Father   . Diabetes Father   . Hypertension Sister   . Fibroids Daughter   . HIV/AIDS Brother   . Hypertension Sister   . Hypertension Sister   . Hypertension Brother   . Asthma Brother   . Hypertension Brother   . Hypertension Brother   . Hypertension Brother   . Hypertension Brother    History  Sexual Activity  . Sexual activity: No    Outpatient Encounter Prescriptions as of 03/14/2017  Medication Sig  . acetaminophen (TYLENOL) 325 MG tablet Take two (2) tablets (650 mg total) by mouth every four to six (4 to 6) hours as needed for moderate pain or headache.  Marland Kitchen aspirin 81 MG tablet Take 1 tablet (81 mg total) by mouth daily.  Marland Kitchen atenolol (TENORMIN) 50 MG tablet TAKE 1 TABLET BY MOUTH EVERY DAY  . Coenzyme Q10 (COQ-10) 50 MG CAPS Take 1 capsule by mouth 2 (two) times daily. (with Grape Seed Complex)  . COLLAGEN PO Take 1 tablet by mouth daily.  . famotidine (PEPCID) 20 MG tablet Take 20 mg by mouth daily as needed for heartburn or indigestion.  . Multiple Vitamin (MULTIVITAMIN WITH MINERALS) TABS tablet Take 1 tablet by mouth daily.  . Omega-3 Fatty Acids (FISH OIL PO) Take 1 capsule by mouth 2 (two) times daily.   Marland Kitchen OVER THE COUNTER MEDICATION Take 700 mg by mouth 2 (two) times daily. APPLE PECTIN - INTESTINAL SUPPORT  . potassium chloride SA (KLOR-CON M20) 20 MEQ tablet Take 2 tablets ( ) by mouth in the morning and 1 tablet by mouth in the evening  . Red Yeast Rice 600 MG CAPS Take 1 capsule by mouth 2 (two) times daily.   Marland Kitchen spironolactone (ALDACTONE) 25 MG tablet Take 1 tablet (25 mg total) by mouth daily.   No facility-administered encounter medications on file as of 03/14/2017.     Activities of Daily Living In your  present state of health, do you have any difficulty performing the following activities: 03/14/2017  Hearing? N  Vision? N  Difficulty concentrating or making decisions? Y  Walking or climbing stairs? Y  Dressing or bathing? N  Doing errands, shopping? N  Preparing Food  and eating ? N  Using the Toilet? N  In the past six months, have you accidently leaked urine? Y  Do you have problems with loss of bowel control? Y  Managing your Medications? N  Managing your Finances? Y  Housekeeping or managing your Housekeeping? N  Some recent data might be hidden   Home Safety:  My home has a working smoke alarm:  Yes X 1           My home throw rugs have been fastened down to the floor or removed:  Non-slip backs I have a non-slip surface or non-slip mats in the bathtub and shower:  Yes        All my home's stairs have handrails, including any outdoor stairs  .first floor apt with 4 steps with handrail        My home's floors, stairs and hallways are free from clutter, wires and cords:  Yes     I have animals in my home  No I wear seatbelts consistently:  Yes    Patient Care Team: McDiarmid, Leighton Roach, MD as PCP - General Lyn Records, MD as Consulting Physician (Cardiology) Charna Elizabeth, MD (Gastroenterology) Francena Hanly, MD (Orthopedic Surgery) Dutch Quint, MD (Oral Surgery) Ernesto Rutherford, MD as Consulting Physician (Ophthalmology) Lyn Records, MD as Consulting Physician (Cardiology)    Assessment:     Exercise Activities and Dietary recommendations Current Exercise Habits: Home exercise routine;Structured exercise class, Type of exercise: stretching;strength training/weights (water aerobics), Time (Minutes): 30, Frequency (Times/Week): 4, Weekly Exercise (Minutes/Week): 120, Intensity: Moderate, Exercise limited by: Other - see comments ("Tires easily")  Goals    . Eat more fruits and vegetables- 3 vegetables a day 2 fruits a day    . Exercise 3x per week (30 min per time)       Patient has been exercising 4 days per week and will either continue this or increase to 5 days per week.  Fall Risk Fall Risk  03/14/2017 03/14/2017 11/29/2016 03/21/2016 08/18/2015  Falls in the past year? - Yes No Yes No  Number falls in past yr: (No Data) 2 or more - 1 -  Injury with Fall? - Yes - No -  Risk Factor Category  - High Fall Risk - - -  Risk for fall due to : - History of fall(s) - - -  Risk for fall due to (comments): - - - - -   Depression Screen PHQ 2/9 Scores 03/14/2017 11/29/2016 03/21/2016 08/18/2015  PHQ - 2 Score 4 0 0 0  PHQ- 9 Score 18 - - -  Exception Documentation (No Data) - - -  Offered patient to meet with BH/IC directly after visit and patient declined 2/2 lack of time. Would like Scientist, water quality to call her. Will forward to In-house LCSW.  TUG Test:  Done in 10 seconds. Patient used one hand to sit back down only. Patient wearing flip flops. Discussed wearing closed, well-fitting shoes to decrease risk of fall.  Falls prevention discussed in detail and literature given.  Cognitive Function: Mini-Cog  Passed with score 5/5   Cognitive Function MMSE - Mini Mental State Exam 12/05/2011  Orientation to time 5  Orientation to Place 5  Registration 3  Attention/ Calculation 5  Recall 3  Language- name 2 objects 2  Language- repeat 1  Language- follow 3 step command 3  Language- read & follow direction 1  Write a sentence 1  Copy design 1  Total score  30        Immunization History  Administered Date(s) Administered  . Influenza Split 08/23/2011, 09/08/2012  . Influenza Whole 08/16/2009  . Influenza,inj,Quad PF,36+ Mos 09/15/2013, 05/26/2015  . Influenza-Unspecified 07/01/2014, 07/16/2016  . PPD Test 02/02/2015  . Pneumococcal Conjugate-13 10/28/2014  . Pneumococcal Polysaccharide-23 01/15/2011  . Td 05/04/2008  . Zoster 11/28/2005   Screening Tests Health Maintenance  Topic Date Due  . COLONOSCOPY  09/17/2016  . INFLUENZA VACCINE  04/17/2017   . TETANUS/TDAP  05/04/2018  . MAMMOGRAM  05/18/2018  . DEXA SCAN  Completed  . Hepatitis C Screening  Completed  . PNA vac Low Risk Adult  Completed      Plan:     Patient with complaints of poor appetite. PHQ-9 score of 18. Would like in-house LCSW to call her. Has questions re asthma, sickle cell trait and questionable herpes. First available appt made with PCP to discuss.  I have personally reviewed and noted the following in the patient's chart:   . Medical and social history . Use of alcohol, tobacco or illicit drugs  . Current medications and supplements . Functional ability and status . Nutritional status . Physical activity . Advanced directives . List of other physicians . Hospitalizations, surgeries, and ER visits in previous 12 months . Vitals . Screenings to include cognitive, depression, and falls . Referrals and appointments  In addition, I have reviewed and discussed with patient certain preventive protocols, quality metrics, and best practice recommendations. A written personalized care plan for preventive services as well as general preventive health recommendations were provided to patient.     Fredderick SeveranceDUCATTE, LAURENZE L, RN  03/14/2017

## 2017-03-14 NOTE — Patient Instructions (Addendum)
[name],  Thank you for taking time to come for yourMedicare Wellness Visit. I appreciate your ongoing commitment to your health goals. Please review the following plan we discussed and let me know if I can assist you in the future.   These are the goals we discussed:  Goals    . Eat more fruits and vegetables- 3 vegetables a day 2 fruits a day    . Exercise 3x per week (30 min per time)        Fall Prevention in the Home Falls can cause injuries. They can happen to people of all ages. There are many things you can do to make your home safe and to help prevent falls. What can I do on the outside of my home?  Regularly fix the edges of walkways and driveways and fix any cracks.  Remove anything that might make you trip as you walk through a door, such as a raised step or threshold.  Trim any bushes or trees on the path to your home.  Use bright outdoor lighting.  Clear any walking paths of anything that might make someone trip, such as rocks or tools.  Regularly check to see if handrails are loose or broken. Make sure that both sides of any steps have handrails.  Any raised decks and porches should have guardrails on the edges.  Have any leaves, snow, or ice cleared regularly.  Use sand or salt on walking paths during winter.  Clean up any spills in your garage right away. This includes oil or grease spills. What can I do in the bathroom?  Use night lights.  Install grab bars by the toilet and in the tub and shower. Do not use towel bars as grab bars.  Use non-skid mats or decals in the tub or shower.  If you need to sit down in the shower, use a plastic, non-slip stool.  Keep the floor dry. Clean up any water that spills on the floor as soon as it happens.  Remove soap buildup in the tub or shower regularly.  Attach bath mats securely with double-sided non-slip rug tape.  Do not have throw rugs and other things on the floor that can make you trip. What can I do  in the bedroom?  Use night lights.  Make sure that you have a light by your bed that is easy to reach.  Do not use any sheets or blankets that are too big for your bed. They should not hang down onto the floor.  Have a firm chair that has side arms. You can use this for support while you get dressed.  Do not have throw rugs and other things on the floor that can make you trip. What can I do in the kitchen?  Clean up any spills right away.  Avoid walking on wet floors.  Keep items that you use a lot in easy-to-reach places.  If you need to reach something above you, use a strong step stool that has a grab bar.  Keep electrical cords out of the way.  Do not use floor polish or wax that makes floors slippery. If you must use wax, use non-skid floor wax.  Do not have throw rugs and other things on the floor that can make you trip. What can I do with my stairs?  Do not leave any items on the stairs.  Make sure that there are handrails on both sides of the stairs and use them. Fix handrails that  are broken or loose. Make sure that handrails are as long as the stairways.  Check any carpeting to make sure that it is firmly attached to the stairs. Fix any carpet that is loose or worn.  Avoid having throw rugs at the top or bottom of the stairs. If you do have throw rugs, attach them to the floor with carpet tape.  Make sure that you have a light switch at the top of the stairs and the bottom of the stairs. If you do not have them, ask someone to add them for you. What else can I do to help prevent falls?  Wear shoes that: ? Do not have high heels. ? Have rubber bottoms. ? Are comfortable and fit you well. ? Are closed at the toe. Do not wear sandals.  If you use a stepladder: ? Make sure that it is fully opened. Do not climb a closed stepladder. ? Make sure that both sides of the stepladder are locked into place. ? Ask someone to hold it for you, if possible.  Clearly mark  and make sure that you can see: ? Any grab bars or handrails. ? First and last steps. ? Where the edge of each step is.  Use tools that help you move around (mobility aids) if they are needed. These include: ? Canes. ? Walkers. ? Scooters. ? Crutches.  Turn on the lights when you go into a dark area. Replace any light bulbs as soon as they burn out.  Set up your furniture so you have a clear path. Avoid moving your furniture around.  If any of your floors are uneven, fix them.  If there are any pets around you, be aware of where they are.  Review your medicines with your doctor. Some medicines can make you feel dizzy. This can increase your chance of falling. Ask your doctor what other things that you can do to help prevent falls. This information is not intended to replace advice given to you by your health care provider. Make sure you discuss any questions you have with your health care provider. Document Released: 06/30/2009 Document Revised: 02/09/2016 Document Reviewed: 10/08/2014 Elsevier Interactive Patient Education  2018 [place] Maintenance, Female Adopting a healthy lifestyle and getting preventive care can go a long way to promote health and wellness. Talk with your health care provider about what schedule of regular examinations is right for you. This is a good chance for you to check in with your provider about disease prevention and staying healthy. In between checkups, there are plenty of things you can do on your own. Experts have done a lot of research about which lifestyle changes and preventive measures are most likely to keep you healthy. Ask your health care provider for more information. Weight and diet Eat a healthy diet  Be sure to include plenty of vegetables, fruits, low-fat dairy products, and lean protein.  Do not eat a lot of foods high in solid fats, added sugars, or salt.  Get regular exercise. This is one of the most important things  you can do for your health. ? Most adults should exercise for at least 150 minutes each week. The exercise should increase your heart rate and make you sweat (moderate-intensity exercise). ? Most adults should also do strengthening exercises at least twice a week. This is in addition to the moderate-intensity exercise.  Maintain a healthy weight  Body mass index (BMI) is a measurement that can be used to identify  possible weight problems. It estimates body fat based on height and weight. Your health care provider can help determine your BMI and help you achieve or maintain a healthy weight.  For females 64 years of age and older: ? A BMI below 18.5 is considered underweight. ? A BMI of 18.5 to 24.9 is normal. ? A BMI of 25 to 29.9 is considered overweight. ? A BMI of 30 and above is considered obese.  Watch levels of cholesterol and blood lipids  You should start having your blood tested for lipids and cholesterol at 72 years of age, then have this test every 5 years.  You may need to have your cholesterol levels checked more often if: ? Your lipid or cholesterol levels are high. ? You are older than 71 years of age. ? You are at high risk for heart disease.  Cancer screening Lung Cancer  Lung cancer screening is recommended for adults 39-64 years old who are at high risk for lung cancer because of a history of smoking.  A yearly low-dose CT scan of the lungs is recommended for people who: ? Currently smoke. ? Have quit within the past 15 years. ? Have at least a 30-pack-year history of smoking. A pack year is smoking an average of one pack of cigarettes a day for 1 year.  Yearly screening should continue until it has been 15 years since you quit.  Yearly screening should stop if you develop a health problem that would prevent you from having lung cancer treatment.  Breast Cancer  Practice breast self-awareness. This means understanding how your breasts normally appear and  feel.  It also means doing regular breast self-exams. Let your health care provider know about any changes, no matter how small.  If you are in your 20s or 30s, you should have a clinical breast exam (CBE) by a health care provider every 1-3 years as part of a regular health exam.  If you are 38 or older, have a CBE every year. Also consider having a breast X-ray (mammogram) every year.  If you have a family history of breast cancer, talk to your health care provider about genetic screening.  If you are at high risk for breast cancer, talk to your health care provider about having an MRI and a mammogram every year.  Breast cancer gene (BRCA) assessment is recommended for women who have family members with BRCA-related cancers. BRCA-related cancers include: ? Breast. ? Ovarian. ? Tubal. ? Peritoneal cancers.  Results of the assessment will determine the need for genetic counseling and BRCA1 and BRCA2 testing.  Cervical Cancer Your health care provider may recommend that you be screened regularly for cancer of the pelvic organs (ovaries, uterus, and vagina). This screening involves a pelvic examination, including checking for microscopic changes to the surface of your cervix (Pap test). You may be encouraged to have this screening done every 3 years, beginning at age 40.  For women ages 71-65, health care providers may recommend pelvic exams and Pap testing every 3 years, or they may recommend the Pap and pelvic exam, combined with testing for human papilloma virus (HPV), every 5 years. Some types of HPV increase your risk of cervical cancer. Testing for HPV may also be done on women of any age with unclear Pap test results.  Other health care providers may not recommend any screening for nonpregnant women who are considered low risk for pelvic cancer and who do not have symptoms. Ask your health care provider  if a screening pelvic exam is right for you.  If you have had past treatment for  cervical cancer or a condition that could lead to cancer, you need Pap tests and screening for cancer for at least 20 years after your treatment. If Pap tests have been discontinued, your risk factors (such as having a new sexual partner) need to be reassessed to determine if screening should resume. Some women have medical problems that increase the chance of getting cervical cancer. In these cases, your health care provider may recommend more frequent screening and Pap tests.  Colorectal Cancer  This type of cancer can be detected and often prevented.  Routine colorectal cancer screening usually begins at 72 years of age and continues through 72 years of age.  Your health care provider may recommend screening at an earlier age if you have risk factors for colon cancer.  Your health care provider may also recommend using home test kits to check for hidden blood in the stool.  A small camera at the end of a tube can be used to examine your colon directly (sigmoidoscopy or colonoscopy). This is done to check for the earliest forms of colorectal cancer.  Routine screening usually begins at age 75.  Direct examination of the colon should be repeated every 5-10 years through 72 years of age. However, you may need to be screened more often if early forms of precancerous polyps or small growths are found.  Skin Cancer  Check your skin from head to toe regularly.  Tell your health care provider about any new moles or changes in moles, especially if there is a change in a mole's shape or color.  Also tell your health care provider if you have a mole that is larger than the size of a pencil eraser.  Always use sunscreen. Apply sunscreen liberally and repeatedly throughout the day.  Protect yourself by wearing long sleeves, pants, a wide-brimmed hat, and sunglasses whenever you are outside.  Heart disease, diabetes, and high blood pressure  High blood pressure causes heart disease and increases  the risk of stroke. High blood pressure is more likely to develop in: ? People who have blood pressure in the high end of the normal range (130-139/85-89 mm Hg). ? People who are overweight or obese. ? People who are African American.  If you are 6-101 years of age, have your blood pressure checked every 3-5 years. If you are 85 years of age or older, have your blood pressure checked every year. You should have your blood pressure measured twice-once when you are at a hospital or clinic, and once when you are not at a hospital or clinic. Record the average of the two measurements. To check your blood pressure when you are not at a hospital or clinic, you can use: ? An automated blood pressure machine at a pharmacy. ? A home blood pressure monitor.  If you are between 40 years and 92 years old, ask your health care provider if you should take aspirin to prevent strokes.  Have regular diabetes screenings. This involves taking a blood sample to check your fasting blood sugar level. ? If you are at a normal weight and have a low risk for diabetes, have this test once every three years after 72 years of age. ? If you are overweight and have a high risk for diabetes, consider being tested at a younger age or more often. Preventing infection Hepatitis B  If you have a higher risk for  hepatitis B, you should be screened for this virus. You are considered at high risk for hepatitis B if: ? You were born in a country where hepatitis B is common. Ask your health care provider which countries are considered high risk. ? Your parents were born in a high-risk country, and you have not been immunized against hepatitis B (hepatitis B vaccine). ? You have HIV or AIDS. ? You use needles to inject street drugs. ? You live with someone who has hepatitis B. ? You have had sex with someone who has hepatitis B. ? You get hemodialysis treatment. ? You take certain medicines for conditions, including cancer, organ  transplantation, and autoimmune conditions.  Hepatitis C  Blood testing is recommended for: ? Everyone born from [birth year] through 1966. ? Anyone with known risk factors for hepatitis C.  Sexually transmitted infections (STIs)  You should be screened for sexually transmitted infections (STIs) including gonorrhea and chlamydia if: ? You are sexually active and are younger than 72 years of age. ? You are older than 72 years of age and your health care provider tells you that you are at risk for this type of infection. ? Your sexual activity has changed since you were last screened and you are at an increased risk for chlamydia or gonorrhea. Ask your health care provider if you are at risk.  If you do not have HIV, but are at risk, it may be recommended that you take a prescription medicine daily to prevent HIV infection. This is called pre-exposure prophylaxis (PrEP). You are considered at risk if: ? You are sexually active and do not regularly use condoms or know the HIV status of your partner(s). ? You take drugs by injection. ? You are sexually active with a partner who has HIV.  Talk with your health care provider about whether you are at high risk of being infected with HIV. If you choose to begin PrEP, you should first be tested for HIV. You should then be tested every 3 months for as long as you are taking PrEP. Pregnancy  If you are premenopausal and you may become pregnant, ask your health care provider about preconception counseling.  If you may become pregnant, take 400 to 800 micrograms (mcg) of folic acid every day.  If you want to prevent pregnancy, talk to your health care provider about birth control (contraception). Osteoporosis and menopause  Osteoporosis is a disease in which the bones lose minerals and strength with aging. This can result in serious bone fractures. Your risk for osteoporosis can be identified using a bone density scan.  If you are 42 years of age or  older, or if you are at risk for osteoporosis and fractures, ask your health care provider if you should be screened.  Ask your health care provider whether you should take a calcium or vitamin D supplement to lower your risk for osteoporosis.  Menopause may have certain physical symptoms and risks.  Hormone replacement therapy may reduce some of these symptoms and risks. Talk to your health care provider about whether hormone replacement therapy is right for you. Follow these instructions at home:  Schedule regular health, dental, and eye exams.  Stay current with your immunizations.  Do not use any tobacco products including cigarettes, chewing tobacco, or electronic cigarettes.  If you are pregnant, do not drink alcohol.  If you are breastfeeding, limit how much and how often you drink alcohol.  Limit alcohol intake to no more than 1 drink  per day for nonpregnant women. One drink equals 12 ounces of beer, 5 ounces of wine, or 1 ounces of hard liquor.  Do not use street drugs.  Do not share needles.  Ask your health care provider for help if you need support or information about quitting drugs.  Tell your health care provider if you often feel depressed.  Tell your health care provider if you have ever been abused or do not feel safe at home. This information is not intended to replace advice given to you by your health care provider. Make sure you discuss any questions you have with your health care provider. Document Released: 03/19/2011 Document Revised: 02/09/2016 Document Reviewed: 06/07/2015 Elsevier Interactive Patient Education  [employer].

## 2017-03-15 ENCOUNTER — Telehealth: Payer: Self-pay | Admitting: Licensed Clinical Social Worker

## 2017-03-15 NOTE — Progress Notes (Signed)
[facility], [facility] reference patient's PHQ-9 scores indication of depression.   Called patient ref. The above consult to see if she wanted [facility].  Unable to leave a voice message.  Plan: LCSW will attempt to call patient again in 3 to 5 days.  Sammuel Hineseborah Moore, LCSW Licensed Clinical Social Worker [facility]   [phone] 9:49 AM

## 2017-03-21 ENCOUNTER — Telehealth: Payer: Self-pay | Admitting: Licensed Clinical Social Worker

## 2017-03-21 ENCOUNTER — Other Ambulatory Visit: Payer: Medicare Other

## 2017-03-21 DIAGNOSIS — E876 Hypokalemia: Secondary | ICD-10-CM | POA: Diagnosis not present

## 2017-03-21 NOTE — Progress Notes (Signed)
[facility] reference patient's PHQ-9 scores indication of depression.   Called patient ref. The above consult to see if she wanted to [facility]   Patient stated she is busy right now, on her way out.  She will call LCSW at a later time  Plan: LCSW will wait for return call from patient or f/u with patient during next office visit with Dr. Perley JainMcDiarmid   Sammuel Hineseborah Story Conti, LCSW Licensed Clinical Social Worker [facility]   [phone] 9:49 AM

## 2017-03-22 ENCOUNTER — Telehealth: Payer: Self-pay | Admitting: *Deleted

## 2017-03-22 DIAGNOSIS — E878 Other disorders of electrolyte and fluid balance, not elsewhere classified: Secondary | ICD-10-CM

## 2017-03-22 LAB — BASIC METABOLIC PANEL
BUN/Creatinine Ratio: 14 (ref 12–28)
BUN: 13 mg/dL (ref 8–27)
CO2: 17 mmol/L — ABNORMAL LOW (ref 20–29)
Calcium: 9.8 mg/dL (ref 8.7–10.3)
Chloride: 108 mmol/L — ABNORMAL HIGH (ref 96–106)
Creatinine, Ser: 0.95 mg/dL (ref 0.57–1.00)
GFR calc Af Amer: 70 mL/min/{1.73_m2} (ref >59)
GFR calc non Af Amer: 60 mL/min/{1.73_m2} (ref >59)
Glucose: 86 mg/dL (ref 65–99)
Potassium: 4.1 mmol/L (ref 3.5–5.2)
Sodium: 139 mmol/L (ref 134–144)

## 2017-03-22 LAB — CORTISOL: Cortisol: 10.9 ug/dL

## 2017-03-22 NOTE — Telephone Encounter (Signed)
Spoke with pt and went over results and recommendations per Dr. Katrinka BlazingSmith.  Pt agreeable to come for labs on 7/24.  Pt would like to f/u with PCP in regards to possible acid base problem.  She already has an appt on 7/19 and will speak to him about it at that time.  Advised pt to call if any issues.  Pt appreciative for call.

## 2017-03-22 NOTE — Telephone Encounter (Signed)
-----  Message from Belva Crome, MD sent at 03/22/2017  7:46 AM EDT ----- Let the patient know the potassium is finally into the normal range of 4.1. Continue current therapy. Blood work now suggests the presence of an acid base problem. Bicarbonate is 17. It has been gradually decreasing over the past month. Recommend repeat be met in 2 weeks and nephrology consult. Or, have this problem worked up and managed by her primary physician, Dr. McDiarmid A copy will be sent to McDiarmid, Blane Ohara, MD

## 2017-03-22 NOTE — Addendum Note (Signed)
Addended by: Julio SicksBOWERS, Maxtyn Nuzum L on: 03/22/2017 10:22 AM   Modules accepted: Orders

## 2017-04-01 ENCOUNTER — Encounter: Payer: Self-pay | Admitting: *Deleted

## 2017-04-01 NOTE — Progress Notes (Signed)
I have reviewed this visit and discussed with Lauren Ducatte, RN, BSN, and agree with her documentation.   

## 2017-04-04 ENCOUNTER — Encounter: Payer: Self-pay | Admitting: Family Medicine

## 2017-04-04 ENCOUNTER — Ambulatory Visit (INDEPENDENT_AMBULATORY_CARE_PROVIDER_SITE_OTHER): Payer: Medicare Other | Admitting: Family Medicine

## 2017-04-04 ENCOUNTER — Encounter: Payer: Self-pay | Admitting: Licensed Clinical Social Worker

## 2017-04-04 VITALS — BP 112/62 | HR 73 | Temp 98.9°F | Ht 62.5 in | Wt 153.0 lb

## 2017-04-04 DIAGNOSIS — E876 Hypokalemia: Secondary | ICD-10-CM

## 2017-04-04 DIAGNOSIS — F39 Unspecified mood [affective] disorder: Secondary | ICD-10-CM | POA: Diagnosis not present

## 2017-04-04 DIAGNOSIS — Z79899 Other long term (current) drug therapy: Secondary | ICD-10-CM

## 2017-04-04 DIAGNOSIS — I2510 Atherosclerotic heart disease of native coronary artery without angina pectoris: Secondary | ICD-10-CM

## 2017-04-04 DIAGNOSIS — F341 Dysthymic disorder: Secondary | ICD-10-CM | POA: Insufficient documentation

## 2017-04-04 DIAGNOSIS — Z9181 History of falling: Secondary | ICD-10-CM | POA: Insufficient documentation

## 2017-04-04 DIAGNOSIS — Z0100 Encounter for examination of eyes and vision without abnormal findings: Secondary | ICD-10-CM | POA: Diagnosis not present

## 2017-04-04 DIAGNOSIS — M542 Cervicalgia: Secondary | ICD-10-CM | POA: Insufficient documentation

## 2017-04-04 DIAGNOSIS — R1314 Dysphagia, pharyngoesophageal phase: Secondary | ICD-10-CM | POA: Diagnosis not present

## 2017-04-04 DIAGNOSIS — G479 Sleep disorder, unspecified: Secondary | ICD-10-CM | POA: Insufficient documentation

## 2017-04-04 DIAGNOSIS — I10 Essential (primary) hypertension: Secondary | ICD-10-CM

## 2017-04-04 DIAGNOSIS — R413 Other amnesia: Secondary | ICD-10-CM | POA: Insufficient documentation

## 2017-04-04 DIAGNOSIS — M8589 Other specified disorders of bone density and structure, multiple sites: Secondary | ICD-10-CM | POA: Diagnosis not present

## 2017-04-04 HISTORY — DX: History of falling: Z91.81

## 2017-04-04 HISTORY — DX: Unspecified mood (affective) disorder: F39

## 2017-04-04 HISTORY — DX: Sleep disorder, unspecified: G47.9

## 2017-04-04 HISTORY — DX: Dysthymic disorder: F34.1

## 2017-04-04 NOTE — Assessment & Plan Note (Addendum)
New complaint though had complained of memory difficulties in 2013. Patient passed [facility] at [facility] few weeks ago [facility] for [facility] testing Suspect Mood Disorder is contributing to her perception of her memory problem.
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
## 2017-04-04 NOTE — Assessment & Plan Note (Signed)
New problem New (+) PHQ9 score elevation since 11/2016.  History of sadness and anxiousness noted in problem list in 2011.  Symptoms became most prominent in 2016 after [name]'s marriage ended. 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  No history of psychotropic medication treatments in past. Given the persistence of her mood symptoms, perhaps at a lower level than they have been in the last several months, Adjustment Disorder is no longer an appropriate classification for [name]'s mood condition.    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  She is not currently suicidal.  She said she would contact our office if she thought of hurting herself.   [name] is interested in counseling, so will begin there.  [facility] a antidepressant SSRI. She is

## 2017-04-04 NOTE — Assessment & Plan Note (Signed)
Established problem Occasional dysphagia to liquids. No red flags for malignancy. Referral to [facility], Dr Adela LankArmbruster for evaluation and treatment.

## 2017-04-04 NOTE — Assessment & Plan Note (Signed)
Two falls in last [facility] for comprensive evaluation.  Pt taking > 800 IU vitamin D daily.

## 2017-04-04 NOTE — Assessment & Plan Note (Signed)
Established problem Uncontrollled Will evaluate more in future.  Currently, poor sleep hygiene is clear contributor.  Sleep hygiene discussed

## 2017-04-04 NOTE — Assessment & Plan Note (Addendum)
Established problem Controlled Given decline in serum HCO3 with start of Spironolactone in June 2018,  Basic Metabolic Panel:    Component Value Date/Time   NA 142 04/04/2017 1140   K 4.1 04/04/2017 1140   CL 107 (H) 04/04/2017 1140   CO2 21 04/04/2017 1140   BUN 13 04/04/2017 1140   CREATININE 0.98 04/04/2017 1140   CREATININE 0.54 (L) 12/26/2015 1156   GLUCOSE 104 (H) 04/04/2017 1140   GLUCOSE 94 12/26/2015 1156   CALCIUM 9.5 04/04/2017 1140  Improved serum bicarbonate. SK+ WNL.  Will continue Atenolol and Spironolactone for now.

## 2017-04-04 NOTE — Patient Instructions (Addendum)
We are checking blood work to see if your Spironolactone medication may be causing problems.  Come back to see Dr Dilpreet F[facility] to see check on your memory.   Dr Savoy Somerville will make a referral to [facility] for your swallowing problem and follow up colonoscopy.   We will set up a Bone Scan (DEXA scan) to see if your bones are getting weaker.
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
## 2017-04-05 LAB — BASIC METABOLIC PANEL
BUN/Creatinine Ratio: 13 (ref 12–28)
BUN: 13 mg/dL (ref 8–27)
CO2: 21 mmol/L (ref 20–29)
Calcium: 9.5 mg/dL (ref 8.7–10.3)
Chloride: 107 mmol/L — ABNORMAL HIGH (ref 96–106)
Creatinine, Ser: 0.98 mg/dL (ref 0.57–1.00)
GFR calc Af Amer: 67 mL/min/{1.73_m2} (ref >59)
GFR calc non Af Amer: 58 mL/min/{1.73_m2} — ABNORMAL LOW (ref >59)
Glucose: 104 mg/dL — ABNORMAL HIGH (ref 65–99)
Potassium: 4.1 mmol/L (ref 3.5–5.2)
Sodium: 142 mmol/L (ref 134–144)

## 2017-04-05 LAB — MAGNESIUM: Magnesium: 1.8 mg/dL (ref 1.6–2.3)

## 2017-04-08 ENCOUNTER — Telehealth: Payer: Self-pay | Admitting: Interventional Cardiology

## 2017-04-08 ENCOUNTER — Encounter: Payer: Self-pay | Admitting: Family Medicine

## 2017-04-08 NOTE — Telephone Encounter (Signed)
Spoke with pt and advised ok not to come to our office for labs since she had them drawn at PCP office Friday.  Pt appreciative for call.  Will route to Dr. Katrinka BlazingSmith to make him aware f/u labs are available in [facility].  Pt with recent Hypokalemia.  K+ 4.1 on last two lab draws.

## 2017-04-08 NOTE — Telephone Encounter (Signed)
Does not need labs done here.

## 2017-04-08 NOTE — Telephone Encounter (Signed)
Great!

## 2017-04-08 NOTE — Telephone Encounter (Signed)
New message    Pt had labs done at Dr [facility]Diarmid [facility] does she need to have them  Redone tomorrow, if not please cancel appt for tomorrow and let her know

## 2017-04-09 ENCOUNTER — Other Ambulatory Visit: Payer: Medicare Other

## 2017-04-11 ENCOUNTER — Ambulatory Visit (INDEPENDENT_AMBULATORY_CARE_PROVIDER_SITE_OTHER): Payer: Medicare Other | Admitting: Licensed Clinical Social Worker

## 2017-04-11 DIAGNOSIS — F39 Unspecified mood [affective] disorder: Secondary | ICD-10-CM

## 2017-04-11 NOTE — Progress Notes (Signed)
Total time:40 minutes Type of Service: Integrated Behavioral Health F/U visit  Interpretor:No.    SUBJECTIVE: [name] is a 71 y.o. female  Patient was referred by McDiarmid for: assistance with managing depression symptoms.   Patient reports the following concerns: stress from her daughter living with her and feeling of loss and frustration from separation with husband 3 years ago.   Reason for follow-up: Continue brief intervention to address symptoms associated with depression.  Patient presented well groomed, pleasant and engaged in conversation. No screening completed during this visit.  LIFE CONTEXT:  Family & Social: daughter lives with patient, patient is planning to move in with younger daughter in Sept  School/ Work: retired from [employer]  Life changes: life transitions, difficult with memory, family stressors   GOALS:  Patient will reduce symptoms of: depression and stress, , and increase knowledge and/or ability [id number]:coping skills, self-management skills and stress reduction,   Intervention: Motivational Interviewing, Behavioral Therapy (Relaxed breathing). Education on grief.   Issues discussed: family stressors, normalizing grief and loss associated with life transitions, options to relieve and manage stress, advantages and disadvantages of moving our of her apartment.       ASSESSMENT:Patient currently experiencing depression.  Symptoms exacerbated by life transitions and family stressors.  Patient may benefit from, and is in agreement to receive further assessment and brief therapeutic interventions to assist with managing her symptoms.   PLAN: 1. Patient will F/U with LCSW in two weeks 2. Behavioral recommendations: relaxed breathing/ calming techniques 3. Referral: none at this time  Sammuel Hineseborah Moore, LCSW Licensed Clinical Social Worker [facility]   [phone] 10:13 AM

## 2017-04-25 ENCOUNTER — Ambulatory Visit (INDEPENDENT_AMBULATORY_CARE_PROVIDER_SITE_OTHER): Payer: Medicare Other | Admitting: Licensed Clinical Social Worker

## 2017-04-25 DIAGNOSIS — F439 Reaction to severe stress, unspecified: Secondary | ICD-10-CM

## 2017-04-25 NOTE — Progress Notes (Signed)
Total time: 30 Minutes Type of Service: Integrated Behavioral Health F/U visit  Interpretor:No.    SUBJECTIVE: [name] is a 71 y.o. female  Patient was referred by McDiarmid for: assistance with managing depression symptoms.   Patient reports the following concerns: stress from her daughter living with her and feeling of loss and frustration.  Reason for follow-up: Continue brief intervention to address symptoms associated with depression.  Patient presented well groomed, pleasant and engaged in conversation. Patient is excited thinking of moving out of her apartment. Reports overall she is feeling better.  This is also evident by  PHQ-9 score today of 9, indication of mild depression.  Which is decrease from 20 about two weeks ago.    LIFE CONTEXT:  Family & Social: daughter lives with patient, patient is planning to move in with younger daughter in Sept  School/ Work: retired from [employer]  Life changes: life transitions, difficult with memory, family stressors   GOALS:  Patient will reduce symptoms of: depression and stress, , and increase knowledge and/or ability ZO:[id number]:coping skills, self-management skills and stress reduction,   Intervention: Motivational Interviewing, Brief CBT, and Solution-Focused Strategies.  Issues discussed: family stressors, normalizing feelings with life transitions, overwhelming feeling with preparing to move.        ASSESSMENT:Patient currently experiencing depression.  Symptoms exacerbated by life transitions and family stressors.  Patient may benefit from, and is in agreement to receive further assessment and brief therapeutic interventions to assist with managing her symptoms.   PLAN: 1. Patient will F/U with LCSW in two weeks 2. Behavioral recommendations: relaxed breathing/ calming techniques 3. Referral: none at this time  Sammuel Hineseborah Banner Huckaba, LCSW Licensed Clinical Social Worker [facility]   [phone] 10:37 AM

## 2017-05-03 ENCOUNTER — Other Ambulatory Visit: Payer: Self-pay | Admitting: Family Medicine

## 2017-05-03 DIAGNOSIS — Z1231 Encounter for screening mammogram for malignant neoplasm of breast: Secondary | ICD-10-CM

## 2017-05-09 ENCOUNTER — Ambulatory Visit (INDEPENDENT_AMBULATORY_CARE_PROVIDER_SITE_OTHER): Payer: Medicare Other | Admitting: Licensed Clinical Social Worker

## 2017-05-09 ENCOUNTER — Ambulatory Visit (INDEPENDENT_AMBULATORY_CARE_PROVIDER_SITE_OTHER): Payer: Medicare Other | Admitting: Family Medicine

## 2017-05-09 DIAGNOSIS — W19XXXA Unspecified fall, initial encounter: Secondary | ICD-10-CM | POA: Diagnosis not present

## 2017-05-09 DIAGNOSIS — F341 Dysthymic disorder: Secondary | ICD-10-CM

## 2017-05-09 DIAGNOSIS — N393 Stress incontinence (female) (male): Secondary | ICD-10-CM

## 2017-05-09 DIAGNOSIS — G3184 Mild cognitive impairment, so stated: Secondary | ICD-10-CM

## 2017-05-09 DIAGNOSIS — F439 Reaction to severe stress, unspecified: Secondary | ICD-10-CM

## 2017-05-10 ENCOUNTER — Encounter: Payer: Self-pay | Admitting: Family Medicine

## 2017-05-10 DIAGNOSIS — G3184 Mild cognitive impairment, so stated: Secondary | ICD-10-CM | POA: Insufficient documentation

## 2017-05-10 DIAGNOSIS — W19XXXA Unspecified fall, initial encounter: Secondary | ICD-10-CM

## 2017-05-10 DIAGNOSIS — N393 Stress incontinence (female) (male): Secondary | ICD-10-CM

## 2017-05-10 HISTORY — DX: Stress incontinence (female) (male): N39.3

## 2017-05-10 HISTORY — DX: Unspecified fall, initial encounter: W19.XXXA

## 2017-05-10 NOTE — Progress Notes (Signed)
Total time: 30 Minutes Type of Service: Integrated Behavioral Health F/U visit  Interpretor:No.    SUBJECTIVE: [name] is a 71 y.o. female  Patient was referred by McDiarmid several weeks ago for: assistance with managing symptoms of depression .   Patient continues to reports stress from her daughter living with her, stress of moving in with her younger daugher and feeling of frustration over loss of thoughts while talking.  Reason for follow-up: Continue brief intervention to address symptoms of depression.  Patient presented well groomed, pleasant and engaged in conversation.  Reports she is feeling a bit anxious about her future move.  This is also evident by  PHQ-9 score today of 10, indication of moderat depression.  Which is slightly increased from previous visit.   LIFE CONTEXT:  Family & Social: daughter lives with patient, patient is planning to move in with younger daughter in Sept  School/ Work: retired from [employer]  Life changes: life transitions, difficult with memory, family stressors   GOALS:  Patient will reduce symptoms of: depression and stress, , and increase knowledge and/or ability HB:[id number] skills, self-management skills and stress reduction,   Intervention: Motivational Interviewing, reflective listening and Solution-Focused Strategies.  Issues discussed: family stressors, normalizing overwhelming feeling with moving,  ambivalence of moving in with daughter      ASSESSMENT:Patient currently experiencing symptoms of mild to moderated  depression.  Symptoms continue to be exacerbated by life transitions and family stressors.  Patient may benefit from, and is in agreement to continue further assessment and brief therapeutic interventions to assist with managing her symptoms.   PLAN: 1. Patient will F/U with LCSW in two weeks after her move. 2. Behavioral recommendations: relaxed breathing/ calming techniques,  3. Referral: none at this time  Sammuel Hines,  LCSW Licensed Clinical Social Worker [facility]   [phone] 9:13 AM

## 2017-05-10 NOTE — Assessment & Plan Note (Addendum)
New diagnosis. MoCA score abnormal at 24 / 30 with no impairments in iADLs / ADLs. 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  Given nonamnestic quality of test results, [name]'s test score may improve in future should her anxious dysthymia condition improves.  Discussed role of diet, exercise, group and social activities in preservation of cognition.  Repeat MoCA in 1-2 years or prn

## 2017-05-10 NOTE — Assessment & Plan Note (Addendum)
Trip over curb.  Was in between cataract surgeries. No injuries.  Need vitamin D assessment nextov.  Discussed leg strengthening exercises for home.

## 2017-05-10 NOTE — Assessment & Plan Note (Signed)
Established condition Depressed mood with sub-MDD associated symptoms. No thoughts of self harm.  Variable expression with linkage to stressors or lack there of in life. Involved in counseling with Alden Server, LCSW, MSW at our practive.  No medications at this time.  Will monitor response to counseling.
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
## 2017-06-03 ENCOUNTER — Ambulatory Visit
Admission: RE | Admit: 2017-06-03 | Discharge: 2017-06-03 | Disposition: A | Discharge status: Home / Self Care | Payer: Medicare Other | Source: Ambulatory Visit | Attending: Family Medicine | Admitting: Family Medicine

## 2017-06-03 DIAGNOSIS — Z1231 Encounter for screening mammogram for malignant neoplasm of breast: Secondary | ICD-10-CM

## 2017-06-03 DIAGNOSIS — M8589 Other specified disorders of bone density and structure, multiple sites: Secondary | ICD-10-CM

## 2017-06-03 DIAGNOSIS — Z780 Asymptomatic menopausal state: Secondary | ICD-10-CM | POA: Diagnosis not present

## 2017-06-06 DIAGNOSIS — H353131 Nonexudative age-related macular degeneration, bilateral, early dry stage: Secondary | ICD-10-CM | POA: Diagnosis not present

## 2017-06-06 DIAGNOSIS — H1851 Endothelial corneal dystrophy: Secondary | ICD-10-CM | POA: Diagnosis not present

## 2017-06-06 DIAGNOSIS — H35371 Puckering of macula, right eye: Secondary | ICD-10-CM | POA: Diagnosis not present

## 2017-06-06 DIAGNOSIS — H2512 Age-related nuclear cataract, left eye: Secondary | ICD-10-CM | POA: Diagnosis not present

## 2017-06-06 DIAGNOSIS — Z961 Presence of intraocular lens: Secondary | ICD-10-CM | POA: Diagnosis not present

## 2017-06-10 ENCOUNTER — Other Ambulatory Visit: Payer: Self-pay | Admitting: Interventional Cardiology

## 2017-06-10 DIAGNOSIS — E876 Hypokalemia: Secondary | ICD-10-CM

## 2017-06-14 DIAGNOSIS — H2512 Age-related nuclear cataract, left eye: Secondary | ICD-10-CM | POA: Diagnosis not present

## 2017-06-24 DIAGNOSIS — H2512 Age-related nuclear cataract, left eye: Secondary | ICD-10-CM | POA: Diagnosis not present

## 2017-07-02 DIAGNOSIS — R197 Diarrhea, unspecified: Secondary | ICD-10-CM | POA: Diagnosis not present

## 2017-07-02 DIAGNOSIS — K52832 Lymphocytic colitis: Secondary | ICD-10-CM | POA: Diagnosis not present

## 2017-07-02 DIAGNOSIS — K219 Gastro-esophageal reflux disease without esophagitis: Secondary | ICD-10-CM | POA: Diagnosis not present

## 2017-07-09 DIAGNOSIS — K52832 Lymphocytic colitis: Secondary | ICD-10-CM | POA: Diagnosis not present

## 2017-07-09 DIAGNOSIS — Z1211 Encounter for screening for malignant neoplasm of colon: Secondary | ICD-10-CM | POA: Diagnosis not present

## 2017-07-09 DIAGNOSIS — R194 Change in bowel habit: Secondary | ICD-10-CM | POA: Diagnosis not present

## 2017-07-18 ENCOUNTER — Encounter: Payer: Self-pay | Admitting: Family Medicine

## 2017-07-23 ENCOUNTER — Encounter: Payer: Self-pay | Admitting: Family Medicine

## 2017-07-23 NOTE — Progress Notes (Unsigned)
OV Dr Loreta AveMann 07/09/17 08/07/17 planned colonoscopy for 10 year screening for CRC.

## 2017-08-12 ENCOUNTER — Telehealth: Payer: Self-pay | Admitting: Interventional Cardiology

## 2017-08-12 NOTE — Telephone Encounter (Signed)
The jaw and chest pain is so brief that cardiac etiology seems unlikely.  No specific workup unless this worsens.  I agree that the other complaints of sudden shaky, weak, and sweating episodes seem noncardiac.

## 2017-08-12 NOTE — Telephone Encounter (Signed)
Pt states that Home Health nurse with pt's insurance came by yesterday and spoke with pt and advised her to call us today.  Pt states about 3 weeks ago she had an episode where she was up cooking and suddenly felt very shaky, weak and was sweating.  Daughter helped pt to sit down, gave her orange juice.  Pt states she felt better after OJ and eating.  States this has happened before in the past but this episode was a little worse.  Last time this happened was back in August.  Same symptoms, pt sat down and drank OJ and felt better.  Advised pt this did not sound cardiac and she should contact her PCP and inquire about hypoglycemia.   Pt then mentioned that she has left side jaw pain and right side CP occasionally.  States this happens about once a month and only lasts about 1-2 mins.  States it does not bother or worry her.  The jaw pain and CP do not occur at the same time.  Denies SOB, lightheaded, dizziness or sweating with these episodes.  Last episode of jaw pain was yesterday.  Will route to Dr. Katrinka BlazingSmith for review and advisement.

## 2017-08-12 NOTE — Telephone Encounter (Signed)
Informed pt of information given by Dr. Katrinka BlazingSmith.  Pt verbalized understanding and was appreciative for call.

## 2017-08-12 NOTE — Telephone Encounter (Signed)
Patient calling, requests to speak with you about "something that happened to her." Patient would not go into detail.

## 2017-08-28 ENCOUNTER — Other Ambulatory Visit: Payer: Medicare Other

## 2017-08-30 ENCOUNTER — Other Ambulatory Visit: Payer: Self-pay | Admitting: Interventional Cardiology

## 2017-08-30 ENCOUNTER — Other Ambulatory Visit: Payer: Medicare Other

## 2017-08-30 DIAGNOSIS — E878 Other disorders of electrolyte and fluid balance, not elsewhere classified: Secondary | ICD-10-CM | POA: Diagnosis not present

## 2017-08-30 DIAGNOSIS — E876 Hypokalemia: Secondary | ICD-10-CM | POA: Diagnosis not present

## 2017-08-31 LAB — BASIC METABOLIC PANEL
BUN/Creatinine Ratio: 13 (ref 12–28)
BUN: 14 mg/dL (ref 8–27)
CO2: 21 mmol/L (ref 20–29)
Calcium: 9.5 mg/dL (ref 8.7–10.3)
Chloride: 106 mmol/L (ref 96–106)
Creatinine, Ser: 1.09 mg/dL — ABNORMAL HIGH (ref 0.57–1.00)
GFR calc Af Amer: 59 mL/min/{1.73_m2} — ABNORMAL LOW (ref >59)
GFR calc non Af Amer: 51 mL/min/{1.73_m2} — ABNORMAL LOW (ref >59)
Glucose: 81 mg/dL (ref 65–99)
Potassium: 4.4 mmol/L (ref 3.5–5.2)
Sodium: 138 mmol/L (ref 134–144)

## 2017-08-31 LAB — SODIUM, URINE, RANDOM: Sodium, Ur: 34 mmol/L

## 2017-09-01 ENCOUNTER — Other Ambulatory Visit: Payer: Self-pay | Admitting: Interventional Cardiology

## 2017-09-01 DIAGNOSIS — I493 Ventricular premature depolarization: Secondary | ICD-10-CM

## 2017-09-02 ENCOUNTER — Telehealth: Payer: Self-pay | Admitting: Family Medicine

## 2017-09-02 ENCOUNTER — Telehealth: Payer: Self-pay | Admitting: Interventional Cardiology

## 2017-09-02 NOTE — Telephone Encounter (Signed)
Pt went to [facility] to get her labs done and they sent her home with a jug to do a 24 hour urine test. [facility] said her heart dr ordered it and she called that office and he doesn't know anything about it. She is calling to see if McDiarmid ordered it for her to do or if its just a complete mishap. Please give this pt a call and tell her if she needs to continue this. Please advise

## 2017-09-02 NOTE — Telephone Encounter (Signed)
Follow up     Patient is returning call from the office

## 2017-09-02 NOTE — Telephone Encounter (Signed)
Will check with MD.  Jazmin Hartsell,CMA  

## 2017-09-02 NOTE — Telephone Encounter (Signed)
Informed pt of lab results. Pt verbalized understanding. 

## 2017-09-03 NOTE — Telephone Encounter (Signed)
I have not knowingly submitted any lab orders for [name].

## 2017-09-03 NOTE — Telephone Encounter (Signed)
Pt contacted and informed of pcp not ordering labs. Pt also inquired about her recent referral to a new GI MD. All questions were answered. Pt was very appreciative.

## 2017-09-24 ENCOUNTER — Telehealth: Payer: Self-pay | Admitting: Gastroenterology

## 2017-09-24 NOTE — Telephone Encounter (Signed)
Previous Gi records from Dr.Mann's office have been received and placed on Dr.Armbruster's desk for review.

## 2017-09-30 ENCOUNTER — Encounter: Payer: Self-pay | Admitting: Gastroenterology

## 2017-09-30 NOTE — Telephone Encounter (Signed)
Dr.Armbruster reviewed records and accepted pt for ov. Called pt but no ans and vm was not set up. Records will be placed in referral box and will try pt again later.

## 2017-10-15 DIAGNOSIS — H20012 Primary iridocyclitis, left eye: Secondary | ICD-10-CM | POA: Insufficient documentation

## 2017-10-15 HISTORY — DX: Primary iridocyclitis, left eye: H20.012

## 2017-10-21 ENCOUNTER — Encounter: Payer: Self-pay | Admitting: Family Medicine

## 2017-11-12 ENCOUNTER — Ambulatory Visit: Payer: Medicare Other | Admitting: Gastroenterology

## 2017-11-19 DIAGNOSIS — Z961 Presence of intraocular lens: Secondary | ICD-10-CM | POA: Diagnosis not present

## 2017-11-19 DIAGNOSIS — H20012 Primary iridocyclitis, left eye: Secondary | ICD-10-CM | POA: Diagnosis not present

## 2017-11-28 ENCOUNTER — Emergency Department (HOSPITAL_COMMUNITY)
Admission: EM | Admit: 2017-11-28 | Discharge: 2017-11-28 | Disposition: A | Discharge status: Home / Self Care | Payer: Medicare Other | Attending: Emergency Medicine | Admitting: Emergency Medicine

## 2017-11-28 ENCOUNTER — Encounter (HOSPITAL_COMMUNITY): Payer: Self-pay

## 2017-11-28 ENCOUNTER — Other Ambulatory Visit: Payer: Self-pay

## 2017-11-28 DIAGNOSIS — Z7982 Long term (current) use of aspirin: Secondary | ICD-10-CM | POA: Diagnosis not present

## 2017-11-28 DIAGNOSIS — I10 Essential (primary) hypertension: Secondary | ICD-10-CM | POA: Diagnosis not present

## 2017-11-28 DIAGNOSIS — J029 Acute pharyngitis, unspecified: Secondary | ICD-10-CM

## 2017-11-28 DIAGNOSIS — Z79899 Other long term (current) drug therapy: Secondary | ICD-10-CM | POA: Insufficient documentation

## 2017-11-28 DIAGNOSIS — R05 Cough: Secondary | ICD-10-CM | POA: Diagnosis not present

## 2017-11-28 LAB — RAPID STREP SCREEN (MED CTR MEBANE ONLY): Streptococcus, Group A Screen (Direct): NEGATIVE

## 2017-11-28 MED ORDER — LIDOCAINE VISCOUS 2 % MT SOLN: 10.0000 mL | OROMUCOSAL | 0 refills | Status: DC | PRN

## 2017-11-28 NOTE — ED Triage Notes (Signed)
Patient complains of intermittent sore throat x 2 years. States last night the pain was worse with swallowing, no fever, NAD

## 2017-11-28 NOTE — ED Notes (Signed)
Pt stable, ambulatory, and verbalizes understanding of d/c instructions.  
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
## 2017-11-28 NOTE — Discharge Instructions (Signed)
Follow up with Dr. Perley JainMcDiarmid

## 2017-11-29 ENCOUNTER — Other Ambulatory Visit: Payer: Self-pay

## 2017-11-30 ENCOUNTER — Other Ambulatory Visit: Payer: Self-pay | Admitting: Interventional Cardiology

## 2017-11-30 DIAGNOSIS — I493 Ventricular premature depolarization: Secondary | ICD-10-CM

## 2017-11-30 LAB — CULTURE, GROUP A STREP (THRC)

## 2017-12-26 ENCOUNTER — Encounter: Payer: Self-pay | Admitting: Gastroenterology

## 2017-12-26 ENCOUNTER — Other Ambulatory Visit: Payer: Self-pay | Admitting: Interventional Cardiology

## 2017-12-26 ENCOUNTER — Ambulatory Visit: Payer: Medicare Other | Admitting: Gastroenterology

## 2017-12-26 VITALS — BP 128/76 | HR 70 | Ht 62.0 in | Wt 150.5 lb

## 2017-12-26 DIAGNOSIS — K529 Noninfective gastroenteritis and colitis, unspecified: Secondary | ICD-10-CM

## 2017-12-26 DIAGNOSIS — I493 Ventricular premature depolarization: Secondary | ICD-10-CM

## 2017-12-26 DIAGNOSIS — K52832 Lymphocytic colitis: Secondary | ICD-10-CM | POA: Diagnosis not present

## 2017-12-26 DIAGNOSIS — R1310 Dysphagia, unspecified: Secondary | ICD-10-CM

## 2017-12-26 DIAGNOSIS — Z1211 Encounter for screening for malignant neoplasm of colon: Secondary | ICD-10-CM

## 2017-12-26 MED ORDER — SUPREP BOWEL PREP KIT 17.5-3.13-1.6 GM/177ML PO SOLN: ORAL | 0 refills | Status: DC

## 2017-12-26 MED ORDER — LOPERAMIDE HCL 2 MG PO CAPS: 2.0000 mg | ORAL_CAPSULE | Freq: Every day | ORAL | 0 refills | Status: DC | PRN

## 2017-12-26 NOTE — Patient Instructions (Addendum)
If you are age 73 or older, your body mass index should be between 23-30. Your Body mass index is 27.53 kg/m. If this is out of the aforementioned range listed, please consider follow up with your Primary Care Provider.  If you are age 73 or younger, your body mass index should be between 19-25. Your Body mass index is 27.53 kg/m. If this is out of the aformentioned range listed, please consider follow up with your Primary Care Provider.   You have been scheduled for an endoscopy and colonoscopy. Please follow the written instructions given to you at your visit today. Please pick up your prep supplies at the pharmacy within the next 1-3 days. If you use inhalers (even only as needed), please bring them with you on the day of your procedure. Your physician has requested that you go to www.startemmi.com and enter the access code given to you at your visit today. This web site gives a general overview about your procedure. However, you should still follow specific instructions given to you by our office regarding your preparation for the procedure.  Please purchase the following medications over the counter and take as directed: Imodium: Take 1 to 2 tablets daily, as needed.  Increase or decrease as needed.  Thank you for entrusting me with your care and for choosing [facility], Dr. Ileene PatrickSteven Armbruster
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
## 2017-12-29 ENCOUNTER — Other Ambulatory Visit: Payer: Self-pay | Admitting: Interventional Cardiology

## 2017-12-29 DIAGNOSIS — E876 Hypokalemia: Secondary | ICD-10-CM

## 2018-01-16 ENCOUNTER — Encounter: Payer: Self-pay | Admitting: Gastroenterology

## 2018-01-16 ENCOUNTER — Telehealth: Payer: Self-pay | Admitting: Gastroenterology

## 2018-01-16 NOTE — Telephone Encounter (Signed)
Pt is requesting to speak with Dr. Adela Lank regarding endoscopy that she has scheduled on 01/29/18. She did not want to give me more details.

## 2018-01-17 NOTE — Telephone Encounter (Signed)
Her primary care referred her to [place] for symptoms of dysphagia which she endorsed to me at the last visit, in addition to symptoms of globus she had periodically. If her swallowing difficulty has completely resolved then she can cancel the EGD if she wishes. If her dysphagia persists then I would recommend she still have it. If she declines that is up to her. Will await colonoscopy. Regarding ENT consult, I will need to discuss symptoms with her when I next see her at time of procedure to determine if that is necessary.

## 2018-01-17 NOTE — Telephone Encounter (Signed)
Called patient to see if I could get a little more information from her. She states that she is "not having any more problems with my throat" and wishes to cancel the EGD. She does wish to proceed with the colonoscopy. I have not cancelled anything yet, did not know if you wanted to speak to her first.

## 2018-01-17 NOTE — Telephone Encounter (Signed)
Patient was requesting ENT referral instead of EGD.

## 2018-01-20 NOTE — Telephone Encounter (Signed)
Okay that's fine.    Thanks.

## 2018-01-20 NOTE — Telephone Encounter (Signed)
If it is okay with you, I am going to leave the [facility] as is for 5/15, with this patient scheduled for endocolon, just incase she changes her mind again and wishes to have it.

## 2018-01-29 ENCOUNTER — Encounter: Payer: Self-pay | Admitting: Gastroenterology

## 2018-01-29 ENCOUNTER — Ambulatory Visit (AMBULATORY_SURGERY_CENTER): Payer: Medicare Other | Admitting: Gastroenterology

## 2018-01-29 ENCOUNTER — Other Ambulatory Visit: Payer: Self-pay

## 2018-01-29 VITALS — BP 126/75 | HR 79 | Temp 97.8°F | Resp 17 | Ht 62.0 in | Wt 150.0 lb

## 2018-01-29 DIAGNOSIS — K529 Noninfective gastroenteritis and colitis, unspecified: Secondary | ICD-10-CM

## 2018-01-29 DIAGNOSIS — Z1211 Encounter for screening for malignant neoplasm of colon: Secondary | ICD-10-CM | POA: Diagnosis not present

## 2018-01-29 DIAGNOSIS — R1310 Dysphagia, unspecified: Secondary | ICD-10-CM | POA: Diagnosis present

## 2018-01-29 DIAGNOSIS — K2980 Duodenitis without bleeding: Secondary | ICD-10-CM

## 2018-01-29 DIAGNOSIS — R197 Diarrhea, unspecified: Secondary | ICD-10-CM

## 2018-01-29 DIAGNOSIS — K2950 Unspecified chronic gastritis without bleeding: Secondary | ICD-10-CM | POA: Diagnosis not present

## 2018-01-29 DIAGNOSIS — K317 Polyp of stomach and duodenum: Secondary | ICD-10-CM

## 2018-01-29 HISTORY — DX: Polyp of stomach and duodenum: K31.7

## 2018-01-29 MED ORDER — SODIUM CHLORIDE 0.9 % IV SOLN: 500.0000 mL | INTRAVENOUS | Status: DC

## 2018-01-29 NOTE — Op Note (Signed)
[facility] Patient Name: [name] Procedure Date: 01/29/2018 3:19 PM MRN: [id number] Endoscopist: Viviann Spare P. Armbruster MD, MD Age: 72 Referring MD:  Date of Birth: 06-30-1946 Gender: Female Account #: [id number] Procedure:                Colonoscopy Indications:              Chronic diarrhea, remote history of lymphocytic                            colitis Medicines:                Monitored Anesthesia Care Procedure:                Pre-Anesthesia Assessment:                           - Prior to the procedure, a History and Physical                            was performed, and patient medications and                            allergies were reviewed. The patient's tolerance of                            previous anesthesia was also reviewed. The risks                            and benefits of the procedure and the sedation                            options and risks were discussed with the patient.                            All questions were answered, and informed consent                            was obtained. Prior Anticoagulants: The patient has                            taken no previous anticoagulant or antiplatelet                            agents. ASA Grade Assessment: III - A patient with                            severe systemic disease. After reviewing the risks                            and benefits, the patient was deemed in                            satisfactory condition to undergo the procedure.  After obtaining informed consent, the colonoscope                            was passed under direct vision. Throughout the                            procedure, the patient's blood pressure, pulse, and                            oxygen saturations were monitored continuously. The                            Model PCF-H190DL [id number]) scope was introduced                            through the anus and advanced to the the  terminal                            ileum, with identification of the appendiceal                            orifice and IC valve. The colonoscopy was performed                            without difficulty. The patient tolerated the                            procedure well. The quality of the bowel                            preparation was adequate. The terminal ileum,                            ileocecal valve, appendiceal orifice, and rectum                            were photographed. Scope In: 3:42:39 PM Scope Out: 3:59:37 PM Scope Withdrawal Time: 0 hours 11 minutes 47 seconds  Total Procedure Duration: 0 hours 16 minutes 58 seconds  Findings:                 The perianal and digital rectal examinations were                            normal.                           The terminal ileum appeared normal.                           Internal hemorrhoids were found during retroflexion.                           There was some residual stool in the cecal cap -  most of this was flushed around and no obvious                            polyps in this area. A very small area of the cecum                            was not seen well due to residual stool. The exam                            was otherwise without abnormality. No polyps or                            overt inflamamtory changes.                           Biopsies for histology were taken with a cold                            forceps from the right colon, left colon and                            transverse colon for evaluation of microscopic                            colitis. Complications:            No immediate complications. Estimated blood loss:                            Minimal. Estimated Blood Loss:     Estimated blood loss was minimal. Impression:               - The examined portion of the ileum was normal.                           - Internal hemorrhoids.                           -  The examination was otherwise normal. No overt                            inflammatory changes                           - Biopsies were taken with a cold forceps from the                            right colon, left colon and transverse colon for                            evaluation of microscopic colitis. Recommendation:           - Patient has a contact number available for  emergencies. The signs and symptoms of potential                            delayed complications were discussed with the                            patient. Return to normal activities tomorrow.                            Written discharge instructions were provided to the                            patient.                           - Resume previous diet.                           - Continue present medications.                           - Continue scheduled immodium if that helps                            symptoms, titrate up as needed                           - Await pathology results with further                            recommendations. Viviann Spare P. Armbruster MD, MD 01/29/2018 4:04:17 PM This report has been signed electronically.

## 2018-01-29 NOTE — Patient Instructions (Signed)
YOU HAD AN ENDOSCOPIC PROCEDURE TODAY AT THE [facility]:   Refer to the procedure report that was given to you for any specific questions about what was found during the examination.  If the procedure report does not answer your questions, please call your gastroenterologist to clarify.  If you requested that your care partner not be given the details of your procedure findings, then the procedure report has been included in a sealed envelope for you to review at your convenience later.  YOU SHOULD EXPECT: Some feelings of bloating in the abdomen. Passage of more gas than usual.  Walking can help get rid of the air that was put into your GI tract during the procedure and reduce the bloating. If you had a lower endoscopy (such as a colonoscopy or flexible sigmoidoscopy) you may notice spotting of blood in your stool or on the toilet paper. If you underwent a bowel prep for your procedure, you may not have a normal bowel movement for a few days.  Please Note:  You might notice some irritation and congestion in your nose or some drainage.  This is from the oxygen used during your procedure.  There is no need for concern and it should clear up in a day or so.  SYMPTOMS TO REPORT IMMEDIATELY:   Following lower endoscopy (colonoscopy or flexible sigmoidoscopy):  Excessive amounts of blood in the stool  Significant tenderness or worsening of abdominal pains  Swelling of the abdomen that is new, acute  Fever of 100F or higher   Following upper endoscopy (EGD)  Vomiting of blood or coffee ground material  New chest pain or pain under the shoulder blades  Painful or persistently difficult swallowing  New shortness of breath  Fever of 100F or higher  Black, tarry-looking stools   Please see handout on Hemorrhoids. Continue Immodium and you may titrate up as needed if this helps.  For urgent or emergent issues, a gastroenterologist can be reached at any hour by calling [phone].   DIET:  We do recommend a small meal at first, but then you may proceed to your regular diet.  Drink plenty of fluids but you should avoid alcoholic beverages for 24 hours.  ACTIVITY:  You should plan to take it easy for the rest of today and you should NOT DRIVE or use heavy machinery until tomorrow (because of the sedation medicines used during the test).    FOLLOW UP: Our staff will call the number listed on your records the next business day following your procedure to check on you and address any questions or concerns that you may have regarding the information given to you following your procedure. If we do not reach you, we will leave a message.  However, if you are feeling well and you are not experiencing any problems, there is no need to return our call.  We will assume that you have returned to your regular daily activities without incident.  If any biopsies were taken you will be contacted by phone or by letter within the next 1-3 weeks.  Please call us at [phone] if you have not heard about the biopsies in 3 weeks.    SIGNATURES/CONFIDENTIALITY: You and/or your care partner have signed paperwork which will be entered into your electronic medical record.  These signatures attest to the fact that that the information above on your After Visit Summary has been reviewed and is understood.  Full responsibility of the confidentiality of this discharge  information lies with you and/or your care-partner.  Thank you for letting us take care of your healthcare needs today.

## 2018-01-29 NOTE — Progress Notes (Signed)
Pt verbalize after having her hysterectomy she had a hard time breathing after they pulled out her breathing tube.

## 2018-01-29 NOTE — Progress Notes (Signed)
Called to room to assist during endoscopic procedure.  Patient ID and intended procedure confirmed with present staff. Received instructions for my participation in the procedure from the performing physician.  

## 2018-01-29 NOTE — Op Note (Signed)
[facility] Patient Name: [name] Procedure Date: 01/29/2018 3:10 PM MRN: [id number] Endoscopist: Viviann Spare P. 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 The patient's tolerance of                            previous anesthesia was also reviewed. The risks                            and benefits of the procedure and the sedation                            options and risks were discussed with the patient.                            All questions were answered, and informed consent                            was obtained. Prior Anticoagulants: The patient has                            taken no previous anticoagulant or antiplatelet                            agents. ASA Grade Assessment: III - A patient with                            severe systemic disease. After reviewing the risks                            and benefits, the patient was deemed in                            satisfactory condition to undergo the procedure.                           -  Prior to the procedure, a History and Physical                            was performed, and patient medications and                            allergies were reviewed. The patient's tolerance of                            previous anesthesia was also reviewed. The risks                            and benefits of the procedure and the sedation                             options and risks were discussed with the patient.                            All questions were answered, and informed consent                            was obtained. Prior Anticoagulants: The patient has                            taken no previous anticoagulant or antiplatelet                            agents. ASA Grade Assessment: II - A patient with                            mild systemic disease. After reviewing the risks                            and benefits, the patient was deemed in                            satisfactory condition to undergo the procedure.                           After obtaining informed consent, the endoscope was                            passed under direct vision. Throughout the                            procedure, the patient's blood pressure, pulse, and                            oxygen saturations were monitored continuously.The                            upper GI endoscopy was accomplished without  difficulty. The patient tolerated the procedure                            well. The Endoscope was introduced through the                            mouth, and advanced to the second part of duodenum. Scope In: Scope Out: Findings:                 Esophagogastric landmarks were identified: the                            Z-line was found at 34 cm, the gastroesophageal                            junction was found at 34 cm and the upper extent of                            the gastric folds was found at 34 cm from the                            incisors.                           The exam of the esophagus was otherwise normal. No                            stenosis / stricture appreciated.                           A single roughly 20 mm sessile polypoid lesion was                            found in the gastric fundus. It was benign                            appearing, did not appear adenomatous. Biopsies                             were taken with a cold forceps for histology.                           The exam of the stomach was otherwise normal.                           Biopsies were taken with a cold forceps in the                            gastric body, at the incisura and in the gastric                            antrum for histology given prior histologic gastric  findings.                           The duodenal bulb and second portion of the                            duodenum were normal. Biopsies were taken with a                            cold forceps for histology to rule out enteritis. Complications:            No immediate complications. Estimated blood loss:                            Minimal. Estimated Blood Loss:     Estimated blood loss was minimal. Impression:               - Esophagogastric landmarks identified.                           - Normal esophagus - no clear pathology noted to                            cause dysphagia                           - A single gastric polypoid lesion. Biopsied.                           - Normal stomach otherwise - biopsied given history                            of eosinophilic gastritis                           - Normal duodenal bulb and second portion of the                            duodenum. Biopsied to rule out enteritis. Recommendation:           - Patient has a contact number available for                            emergencies. The signs and symptoms of potential                            delayed complications were discussed with the                            patient. Return to normal activities tomorrow.                            Written discharge instructions were provided to the                            patient.                           -  Resume previous diet.                           - Continue present medications.                           - Await pathology results. Viviann Spare P. Armbruster MD,  MD 01/29/2018 4:10:10 PM This report has been signed electronically.

## 2018-01-30 ENCOUNTER — Telehealth: Payer: Self-pay

## 2018-01-30 ENCOUNTER — Other Ambulatory Visit: Payer: Self-pay | Admitting: Interventional Cardiology

## 2018-01-30 DIAGNOSIS — E876 Hypokalemia: Secondary | ICD-10-CM

## 2018-01-30 NOTE — Telephone Encounter (Signed)
Mailbox full

## 2018-01-30 NOTE — Telephone Encounter (Signed)
  Follow up Call-  Call Nathian Stencil number 01/29/2018  Post procedure Call Sundi Slevin phone  # [phone]  Permission to leave phone message Yes  Some recent data might be hidden     Patient questions:  Do you have a fever, pain , or abdominal swelling? No. Pain Score  0 *  Have you tolerated food without any problems? Yes.    Have you been able to return to your normal activities? Yes.    Do you have any questions about your discharge instructions: Diet   No. Medications  No. Follow up visit  No.  Do you have questions or concerns about your Care? No.  Actions: * If pain score is 4 or above: No action needed, pain <4.

## 2018-02-06 ENCOUNTER — Other Ambulatory Visit: Payer: Self-pay

## 2018-02-06 MED ORDER — CHOLESTYRAMINE 4 G PO PACK: 4.0000 g | PACK | Freq: Four times a day (QID) | ORAL | 11 refills | Status: DC

## 2018-02-07 ENCOUNTER — Encounter: Payer: Self-pay | Admitting: Family Medicine

## 2018-02-13 DIAGNOSIS — Z961 Presence of intraocular lens: Secondary | ICD-10-CM | POA: Diagnosis not present

## 2018-02-13 DIAGNOSIS — H20022 Recurrent acute iridocyclitis, left eye: Secondary | ICD-10-CM | POA: Diagnosis not present

## 2018-02-14 ENCOUNTER — Telehealth: Payer: Self-pay

## 2018-02-14 NOTE — Telephone Encounter (Signed)
Pt called nurse line, states she has had increased allergy sx, particularly eye itching. Wanted to know if she can take loratadine. Pt advised plain loratadine should be fine to take, but to avoid anything with any kind of decongestant do to HTN. Shawna OrleansMeredith B Thomsen, RN

## 2018-02-25 ENCOUNTER — Other Ambulatory Visit: Payer: Self-pay | Admitting: Interventional Cardiology

## 2018-02-25 DIAGNOSIS — E876 Hypokalemia: Secondary | ICD-10-CM

## 2018-03-06 ENCOUNTER — Other Ambulatory Visit: Payer: Self-pay | Admitting: Interventional Cardiology

## 2018-03-06 DIAGNOSIS — I493 Ventricular premature depolarization: Secondary | ICD-10-CM

## 2018-03-24 ENCOUNTER — Telehealth: Payer: Self-pay | Admitting: Gastroenterology

## 2018-03-25 NOTE — Telephone Encounter (Signed)
Called [facility].  Patient would have to pay $285 for a 90 day supply of Questran packets; $95 for a 30 day supply.  Pt has follow up appt on 04-04-18 with SA.

## 2018-03-25 NOTE — Telephone Encounter (Signed)
Okay. We could try ordering colestid 1gm BID to see if that is any cheaper. 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 I will otherwise see her on 7/19 to discuss options

## 2018-03-26 NOTE — Telephone Encounter (Signed)
Called and spoke to pt. Offered to send new Rx for Colestid. She prefers to wait until her appt on 7-19 and will discuss options with Dr. Adela LankArmbruster then. She appreciated the call.

## 2018-04-04 ENCOUNTER — Ambulatory Visit: Payer: Medicare Other | Admitting: Gastroenterology

## 2018-04-09 ENCOUNTER — Encounter: Payer: Self-pay | Admitting: Gastroenterology

## 2018-04-09 ENCOUNTER — Ambulatory Visit: Payer: Medicare Other | Admitting: Gastroenterology

## 2018-04-09 VITALS — BP 120/70 | HR 68 | Ht 62.0 in | Wt 152.0 lb

## 2018-04-09 DIAGNOSIS — K219 Gastro-esophageal reflux disease without esophagitis: Secondary | ICD-10-CM

## 2018-04-09 DIAGNOSIS — K317 Polyp of stomach and duodenum: Secondary | ICD-10-CM | POA: Diagnosis not present

## 2018-04-09 DIAGNOSIS — K52831 Collagenous colitis: Secondary | ICD-10-CM

## 2018-04-09 NOTE — Patient Instructions (Addendum)
If you are age 73 or older, your body mass index should be between 23-30. Your Body mass index is 27.8 kg/m. If this is out of the aforementioned range listed, please consider follow up with your Primary Care Provider.  If you are age 73 or younger, your body mass index should be between 19-25. Your Body mass index is 27.8 kg/m. If this is out of the aformentioned range listed, please consider follow up with your Primary Care Provider.   Thank you for entrusting me with your care and for choosing [facility], Dr. Ileene PatrickSteven Armbruster
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
## 2018-04-10 ENCOUNTER — Encounter: Payer: Self-pay | Admitting: Family Medicine
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
## 2018-04-17 ENCOUNTER — Ambulatory Visit: Payer: Medicare Other | Admitting: Interventional Cardiology

## 2018-04-17 ENCOUNTER — Encounter: Payer: Self-pay | Admitting: Interventional Cardiology

## 2018-04-17 VITALS — BP 126/66 | HR 63 | Ht 62.0 in | Wt 152.4 lb

## 2018-04-17 DIAGNOSIS — G3184 Mild cognitive impairment, so stated: Secondary | ICD-10-CM | POA: Diagnosis not present

## 2018-04-17 DIAGNOSIS — I493 Ventricular premature depolarization: Secondary | ICD-10-CM

## 2018-04-17 DIAGNOSIS — I2510 Atherosclerotic heart disease of native coronary artery without angina pectoris: Secondary | ICD-10-CM | POA: Diagnosis not present

## 2018-04-17 DIAGNOSIS — I10 Essential (primary) hypertension: Secondary | ICD-10-CM

## 2018-04-17 NOTE — Patient Instructions (Signed)

## 2018-05-05 ENCOUNTER — Other Ambulatory Visit: Payer: Self-pay | Admitting: Family Medicine

## 2018-05-05 ENCOUNTER — Telehealth: Payer: Self-pay

## 2018-05-05 DIAGNOSIS — Z1231 Encounter for screening mammogram for malignant neoplasm of breast: Secondary | ICD-10-CM

## 2018-05-05 NOTE — Telephone Encounter (Signed)
Pt called nurse line, states she tried to schedule appointment for mammogram, but when she told the breast center that she is having some pain they advised her to call here. C/o pain in left breast that is sharp but come and goes. Question if she needs to be seen or go for dx mammogram. Call back [phone] Shawna OrleansMeredith B Brooklynn Brandenburg, RN

## 2018-05-06 NOTE — Telephone Encounter (Signed)
Please ask Ms [name] to [facility].  If the physician believes they need a diagnostic mammogram, they will order it.

## 2018-05-06 NOTE — Telephone Encounter (Signed)
Attempted to call patient but voicemail is full.  Will try again later today.  Minami Arriaga,CMA

## 2018-05-07 NOTE — Telephone Encounter (Signed)
Patient came into the office and made an appointment to be seen.  Jazmin Hartsell,CMA

## 2018-05-09 ENCOUNTER — Ambulatory Visit (INDEPENDENT_AMBULATORY_CARE_PROVIDER_SITE_OTHER): Payer: Medicare Other | Admitting: Family Medicine

## 2018-05-09 ENCOUNTER — Other Ambulatory Visit: Payer: Self-pay

## 2018-05-09 VITALS — BP 98/62 | HR 60 | Temp 98.3°F | Wt 149.0 lb

## 2018-05-09 DIAGNOSIS — N644 Mastodynia: Secondary | ICD-10-CM | POA: Diagnosis not present

## 2018-05-09 MED ORDER — ACETAMINOPHEN 500 MG PO TABS: 1000.0000 mg | ORAL_TABLET | Freq: Three times a day (TID) | ORAL | 0 refills | Status: DC | PRN

## 2018-05-09 NOTE — Assessment & Plan Note (Signed)
Left breast pain, seems most likely musculoskeletal.  Patient had recent work-up by cardiologist on zero 8/1 and was deemed to have good bill health.  Symptoms do not seem to be cardiac related.  Will trial Tylenol PRN for pain relief.  Strict return precautions given patient.  Will refer for screening mammography per patient request.

## 2018-05-09 NOTE — Progress Notes (Signed)
    Subjective:  Madison LippsJulia A Spencer is a 72 y.o. female who [facility] today with a chief complaint of left breast pain.   HPI:  Pain started on 2 days ago.  Described as sharp pain in left breast.  Did not radiate anywhere.  Pain lasted only moments and then went away.  Happened multiple times throughout the day.  Patient denies any trauma to the region.  Patient denies any substernal chest pain.  Did endorse some right neck pain.  However this is chronic.  The neck pain is not constant.  Patient denies any shortness of breath, fevers or chills, nausea vomiting, new onset diarrhea or constipation.  She has not had any breast discharge.  When pain occurred it happens at 9/10.  She did not try anything for relief.  Family history: Mother had breast cancer.  Patient concerned about possible breast cancer risk.  She would like to get screening mammogram.  Patient has very anxious affect.  Objective:  Physical Exam: BP 98/62   Pulse 60   Temp 98.3 F (36.8 C) (Oral)   Wt 149 lb (67.6 kg)   SpO2 98%   BMI 27.25 kg/m   Gen: NAD, resting comfortably CV: RRR with no murmurs appreciated Pulm: NWOB, CTAB with no crackles, wheezes, or rhonchi Breast: Left breast no nipple discharge, no overlying skin abnormalities, no palpable masses detected on exam, very tender over left lateral breast. No results found for this or any previous visit (from the past 72 hour(s)).   Assessment/Plan:  Breast pain Left breast pain, seems most likely musculoskeletal.  Patient had recent work-up by cardiologist on zero 8/1 and was deemed to have good bill health.  Symptoms do not seem to be cardiac related.  Will trial Tylenol PRN for pain relief.  Strict return precautions given patient.  Will refer for screening mammography per patient request.   Lab Orders  No laboratory test(s) ordered today    Meds ordered this encounter  Medications  . acetaminophen (TYLENOL) 500 MG tablet    Sig: Take 2 tablets  (1,000 mg total) by mouth every 8 (eight) hours as needed for moderate pain.    Dispense:  30 tablet    Refill:  0    Thomes DinningBrad Thompson, MD, MS FAMILY MEDICINE RESIDENT - PGY2 05/09/2018 9:10 AM

## 2018-05-09 NOTE — Patient Instructions (Signed)
It was a pleasure to see you today! Thank you for choosing [facility] for your primary care. [name] was seen for breast pain. It seems to be musculoskeletal in nature. Take 1000 mg of tylenol every 8 hours as needed for pain. Come back to clinic or call 911 if pain gets worse, does not go away in a few weeks, if you develop chest pain, shortness of breath or any you develop any other worrisome symptoms.   Best,  Thomes DinningBrad Xiana Carns, MD, MS FAMILY MEDICINE RESIDENT - PGY2 05/09/2018 8:56 AM

## 2018-05-10 ENCOUNTER — Other Ambulatory Visit: Payer: Self-pay | Admitting: Interventional Cardiology

## 2018-05-10 DIAGNOSIS — I493 Ventricular premature depolarization: Secondary | ICD-10-CM

## 2018-05-13 ENCOUNTER — Telehealth: Payer: Self-pay

## 2018-05-13 DIAGNOSIS — N644 Mastodynia: Secondary | ICD-10-CM

## 2018-05-13 NOTE — Telephone Encounter (Signed)
Patient called and asked about getting her mammogram. Imaging is saying she still needs to have a diagnostic mammogram and order needs to be put in. Patient states she is still having breast pain but it is less intense.  Call back is [phone].  Ples SpecterAlisa Ludmilla Mcgillis, RN [facility] RN)

## 2018-05-14 ENCOUNTER — Other Ambulatory Visit: Payer: Self-pay | Admitting: Family Medicine

## 2018-05-14 DIAGNOSIS — N644 Mastodynia: Secondary | ICD-10-CM

## 2018-05-14 NOTE — Telephone Encounter (Signed)
Please let patient know order for Diagnostic Mamm[facility].

## 2018-05-14 NOTE — Telephone Encounter (Signed)
Pt contacted and informed of orders in and the need to call to make an apt.

## 2018-05-15 ENCOUNTER — Telehealth: Payer: Self-pay | Admitting: Interventional Cardiology

## 2018-05-15 MED ORDER — SPIRONOLACTONE 25 MG PO TABS: 25.0000 mg | ORAL_TABLET | Freq: Every day | ORAL | 3 refills | Status: DC

## 2018-05-15 NOTE — Telephone Encounter (Signed)
Returned pts call re: refill of Spironolactone. Pt has been made aware that her refill has been sent into her pharmacy. Pt thanked me for the call.

## 2018-05-15 NOTE — Telephone Encounter (Signed)
New message         *STAT* If patient is at the pharmacy, call can be transferred to refill team.   1. Which medications need to be refilled? (please list name of each medication and dose if known) spironolactone (ALDACTONE) 25 MG tablet  2. Which pharmacy/location (including street and city if local pharmacy) is medication to be sent to? [facility]  3. Do they need a 30 day or 90 day supply? 30

## 2018-05-22 ENCOUNTER — Other Ambulatory Visit: Payer: Medicare Other

## 2018-05-29 ENCOUNTER — Ambulatory Visit
Admission: RE | Admit: 2018-05-29 | Discharge: 2018-05-29 | Disposition: A | Discharge status: Home / Self Care | Payer: Medicare Other | Source: Ambulatory Visit | Attending: Family Medicine | Admitting: Family Medicine

## 2018-05-29 ENCOUNTER — Ambulatory Visit: Payer: Medicare Other

## 2018-05-29 DIAGNOSIS — N644 Mastodynia: Secondary | ICD-10-CM

## 2018-06-04 NOTE — Progress Notes (Signed)
Pt has appointment for diagnostic mammogram on 06/12/18.

## 2018-06-09 IMAGING — MR MR MRA HEAD W/O CM
1 series · 19 of 48 positions shown · non-contrast
Comparison: Intracranial MRA 04/21/2015.

CLINICAL DATA: 69-year-old female with anterior headache for 3
weeks. Fall in [REDACTED] on sidewalk, did not lose consciousness. Family
history of intracranial aneurysms. Initial encounter.

EXAM:
MRA HEAD WITHOUT CONTRAST
TECHNIQUE: Angiographic images of the Circle of Willis were obtained using MRA
technique without intravenous contrast.

[Series 3: ax (id) 2 · axial · 1.0mm · 0.43mm/px · z∈[-64,+21]mm · 19 of 184 slices shown]
[im 1/184]
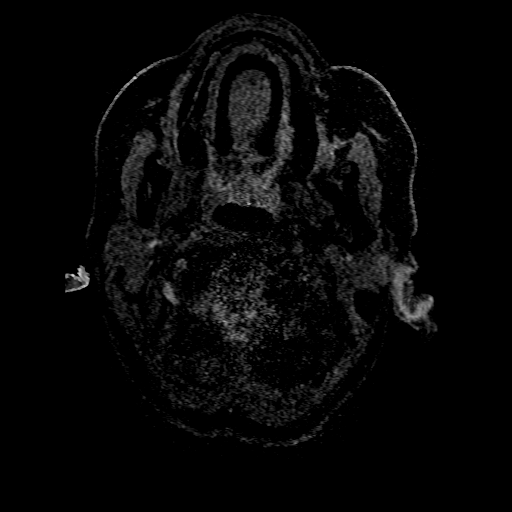
[im 4/184]
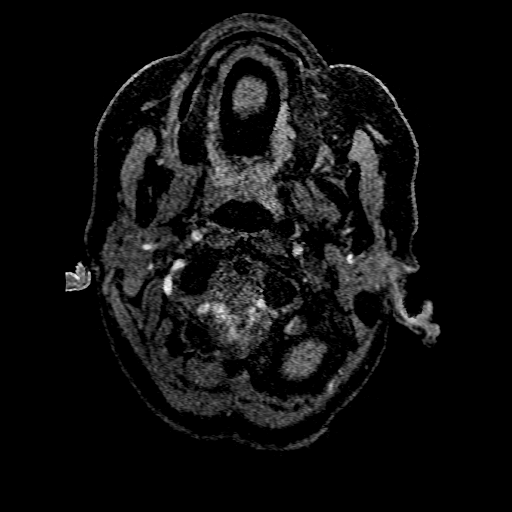
[im 8/184]
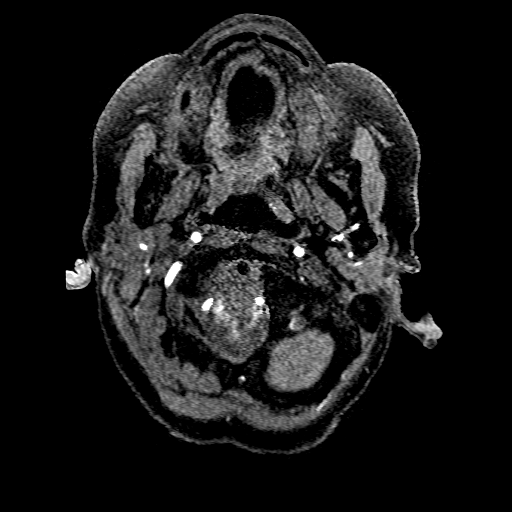
[im 12/184]
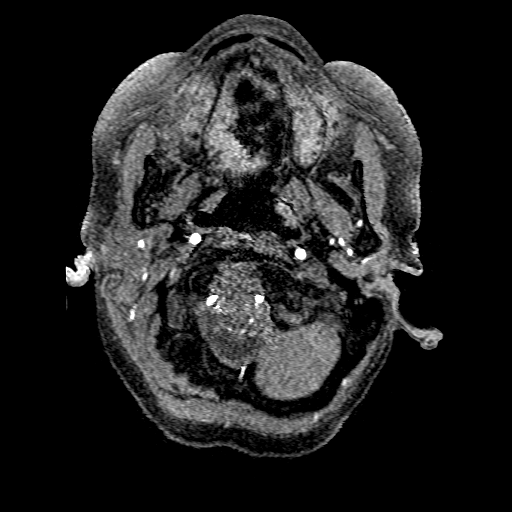
[im 16/184]
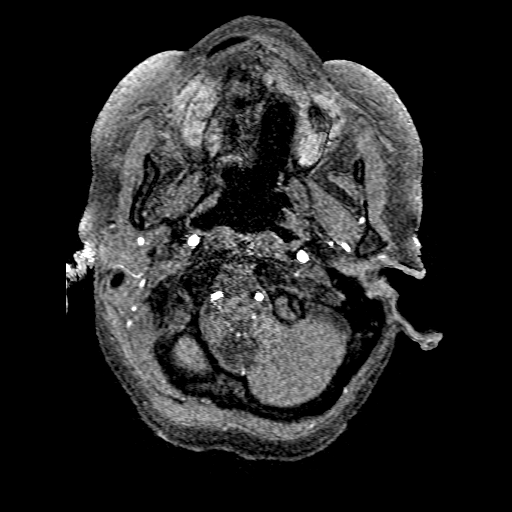
[im 20/184]
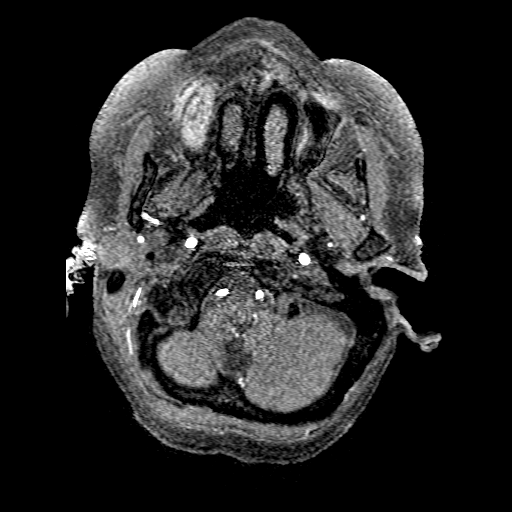
[im 24/184]
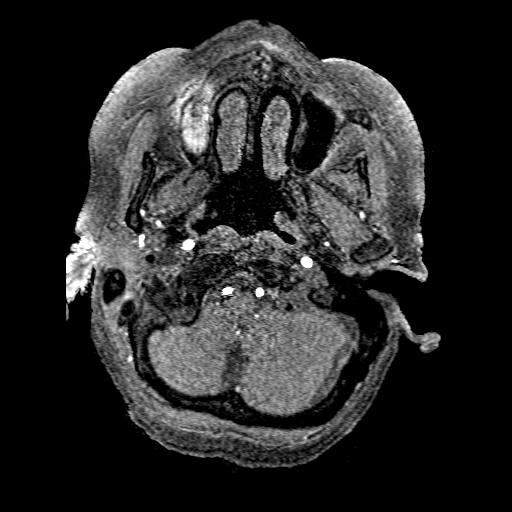
[im 28/184]
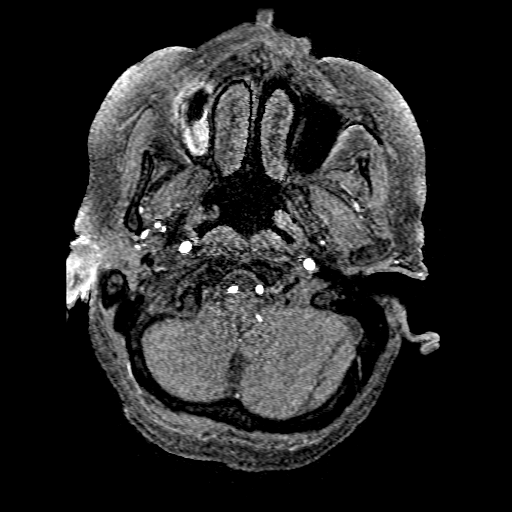
[im 32/184]
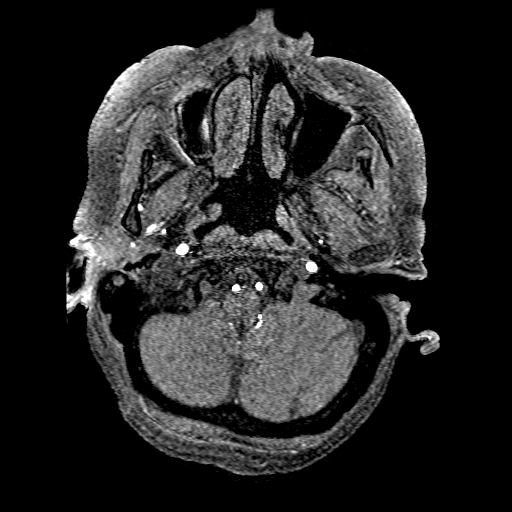
[im 36/184]
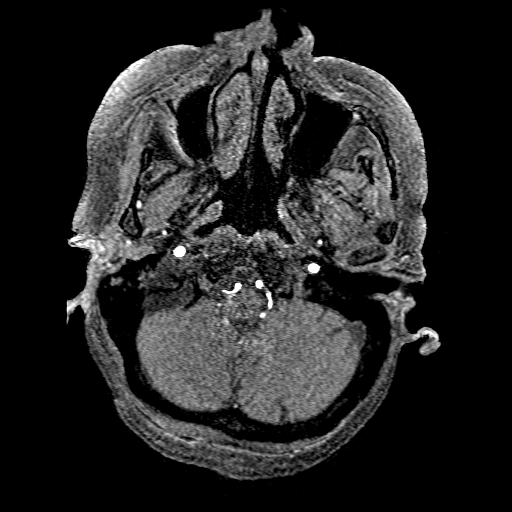
[im 39/184]
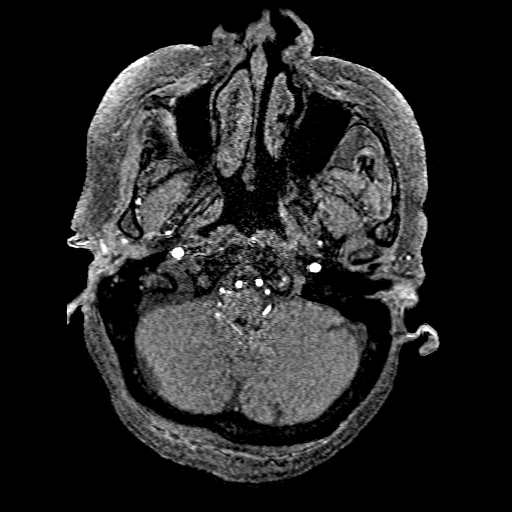
[im 59/184]
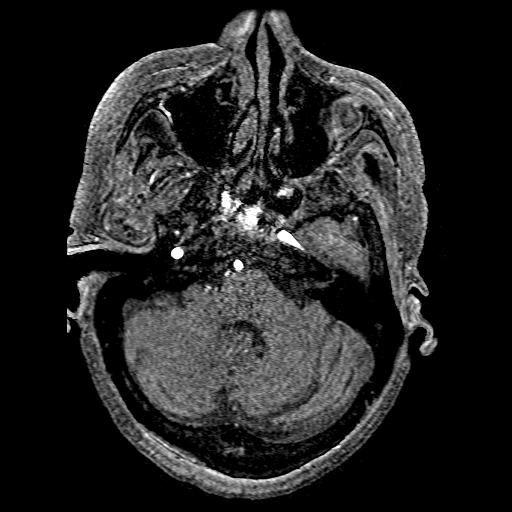
[im 82/184]
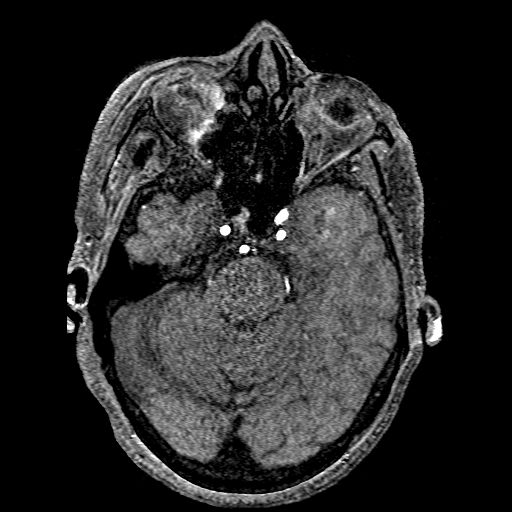
[im 94/184]
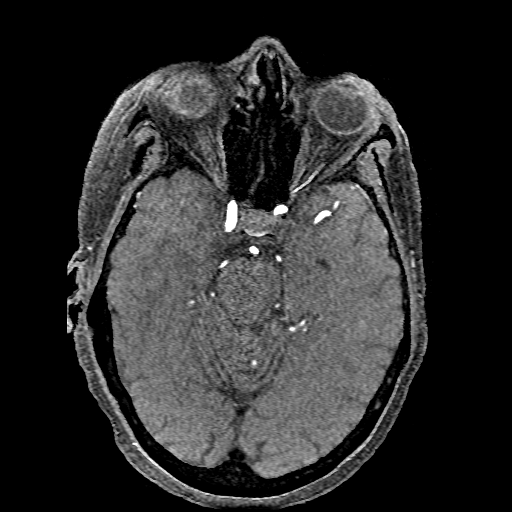
[im 106/184]
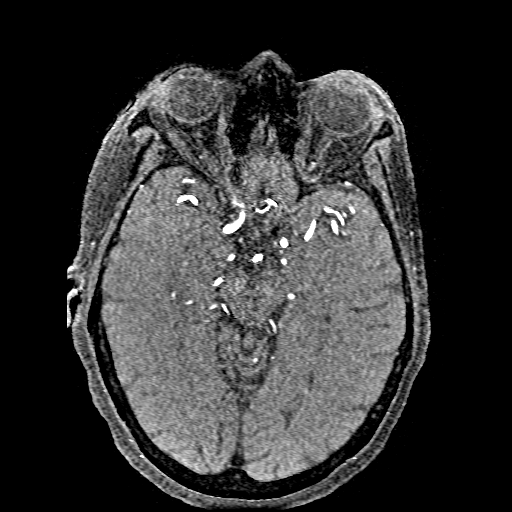
[im 129/184]
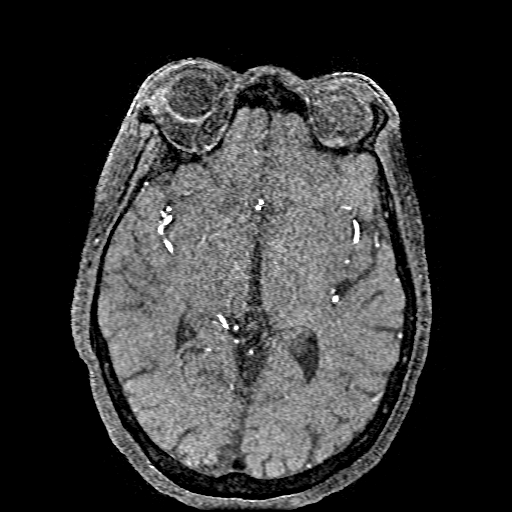
[im 152/184]
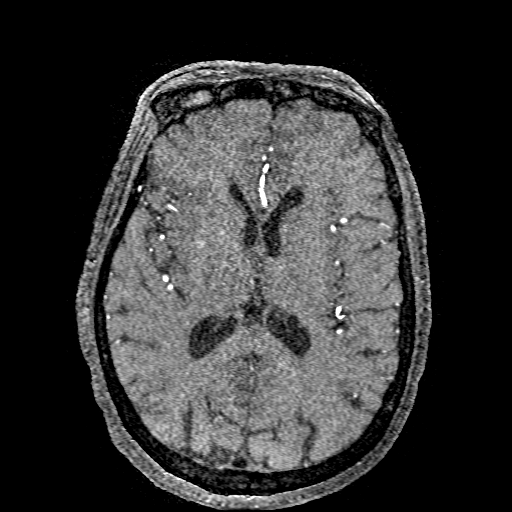
[im 156/184]
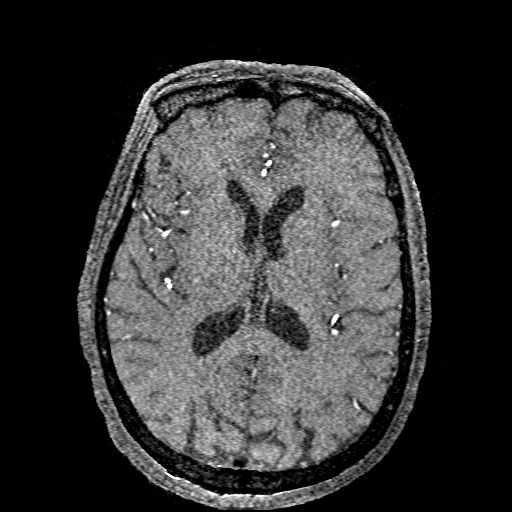
[im 176/184]
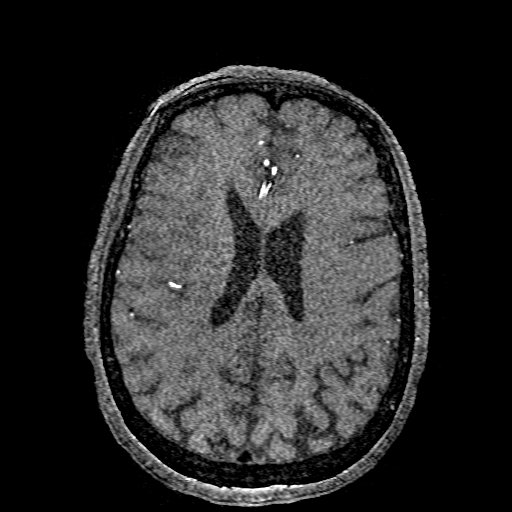

[19 of 48 positions shown; findings below may reference images not displayed]

FINDINGS: Stable antegrade flow in the posterior circulation with fairly
codominant distal vertebral arteries. Normal PICA origins. Normal
vertebrobasilar junction and AICA origin. Normal basilar artery.
Normal SCA and PCA origins. Posterior communicating arteries are
diminutive or absent. Bilateral PCA branches are stable and normal.

Stable antegrade flow in both ICA siphons. Normal ophthalmic artery
origins. No siphon stenosis. Stable and normal carotid termini.
Normal MCA and ACA origins. Tortuous A1 segments are stable. The
anterior communicating artery is normal. Visualized bilateral ACA
branches are within normal limits. Mildly tortuous bilateral MCA
branches. M1 segments, MCA bifurcations, and visualized bilateral
MCA branches otherwise are within normal limits.

Cerebral volume appears stable. No intracranial mass effect is
evident. No ventriculomegaly is evident.
IMPRESSION: Stable and negative intracranial MRA.  No intracranial aneurysm.

## 2018-06-12 ENCOUNTER — Ambulatory Visit
Admission: RE | Admit: 2018-06-12 | Discharge: 2018-06-12 | Disposition: A | Discharge status: Home / Self Care | Payer: Medicare Other | Source: Ambulatory Visit | Attending: Family Medicine | Admitting: Family Medicine

## 2018-06-12 ENCOUNTER — Ambulatory Visit: Payer: Medicare Other

## 2018-06-12 DIAGNOSIS — R928 Other abnormal and inconclusive findings on diagnostic imaging of breast: Secondary | ICD-10-CM | POA: Diagnosis not present

## 2018-06-23 ENCOUNTER — Telehealth: Payer: Self-pay

## 2018-06-23 NOTE — Telephone Encounter (Signed)
I was not attempting to reach [name].

## 2018-06-23 NOTE — Telephone Encounter (Signed)
Just missed a call from our office. Pt was wondering if it was Dr. Emilia Beck calling with Mamogram results. No documentation as to who called. Please return call if it was Dr. Perley Jain. Sunday Spillers, CMA

## 2018-07-07 ENCOUNTER — Other Ambulatory Visit: Payer: Self-pay | Admitting: Interventional Cardiology

## 2018-07-07 DIAGNOSIS — E876 Hypokalemia: Secondary | ICD-10-CM

## 2018-07-07 MED ORDER — POTASSIUM CHLORIDE CRYS ER 20 MEQ PO TBCR: EXTENDED_RELEASE_TABLET | ORAL | 3 refills | Status: DC

## 2018-07-07 NOTE — Telephone Encounter (Signed)
Pt's medication was sent to pt's pharmacy as requested. Confirmation received.  °

## 2018-07-07 NOTE — Telephone Encounter (Signed)
 *  STAT* If patient is at the pharmacy, call can be transferred to refill team.   1. Which medications need to be refilled? (please list name of each medication and dose if known) potassium chloride SA (KLOR-CON M20) 20 MEQ tablet, TAKE TWO (2) TABLETS EACH MORNING AND 1 TABLET EACH EVENING  2. Which pharmacy/location (including street and city if local pharmacy) [facility] [address]  3. Do they need a 30 day or 90 day supply? 90 day

## 2018-07-17 DIAGNOSIS — H353131 Nonexudative age-related macular degeneration, bilateral, early dry stage: Secondary | ICD-10-CM | POA: Diagnosis not present

## 2018-07-17 DIAGNOSIS — H35372 Puckering of macula, left eye: Secondary | ICD-10-CM | POA: Diagnosis not present

## 2018-07-17 DIAGNOSIS — H20022 Recurrent acute iridocyclitis, left eye: Secondary | ICD-10-CM | POA: Diagnosis not present

## 2018-07-17 DIAGNOSIS — Z961 Presence of intraocular lens: Secondary | ICD-10-CM | POA: Diagnosis not present

## 2018-07-17 DIAGNOSIS — H26493 Other secondary cataract, bilateral: Secondary | ICD-10-CM | POA: Diagnosis not present

## 2018-07-18 HISTORY — PX: OTHER SURGICAL HISTORY: SHX169

## 2018-07-22 ENCOUNTER — Encounter: Payer: Self-pay | Admitting: Family Medicine

## 2018-08-19 ENCOUNTER — Telehealth: Payer: Self-pay

## 2018-08-19 NOTE — Telephone Encounter (Signed)
Pt called returning a call from this office. Sunday SpillersSharon T Kamorah Nevils, CMA

## 2018-10-02 ENCOUNTER — Ambulatory Visit (INDEPENDENT_AMBULATORY_CARE_PROVIDER_SITE_OTHER): Payer: Medicare Other | Admitting: Family Medicine

## 2018-10-02 ENCOUNTER — Encounter: Payer: Self-pay | Admitting: Family Medicine

## 2018-10-02 ENCOUNTER — Other Ambulatory Visit: Payer: Self-pay

## 2018-10-02 VITALS — BP 118/68 | HR 80 | Temp 97.7°F | Ht 62.0 in | Wt 156.0 lb

## 2018-10-02 DIAGNOSIS — Z23 Encounter for immunization: Secondary | ICD-10-CM | POA: Diagnosis not present

## 2018-10-02 DIAGNOSIS — K52831 Collagenous colitis: Secondary | ICD-10-CM | POA: Diagnosis not present

## 2018-10-02 DIAGNOSIS — I10 Essential (primary) hypertension: Secondary | ICD-10-CM

## 2018-10-02 DIAGNOSIS — R6884 Jaw pain: Secondary | ICD-10-CM

## 2018-10-02 DIAGNOSIS — E0590 Thyrotoxicosis, unspecified without thyrotoxic crisis or storm: Secondary | ICD-10-CM | POA: Diagnosis not present

## 2018-10-02 DIAGNOSIS — E7800 Pure hypercholesterolemia, unspecified: Secondary | ICD-10-CM

## 2018-10-02 DIAGNOSIS — N393 Stress incontinence (female) (male): Secondary | ICD-10-CM

## 2018-10-02 DIAGNOSIS — K219 Gastro-esophageal reflux disease without esophagitis: Secondary | ICD-10-CM

## 2018-10-02 DIAGNOSIS — E236 Other disorders of pituitary gland: Secondary | ICD-10-CM

## 2018-10-02 MED ORDER — TETANUS-DIPHTH-ACELL PERTUSSIS 5-2.5-18.5 LF-MCG/0.5 IM SUSP: 0.5000 mL | Freq: Once | INTRAMUSCULAR | Status: DC

## 2018-10-02 NOTE — Patient Instructions (Signed)
Ms Orfanos,  Your blood pressure looks to be under good control.  Continue taking your Spironolactone and atenolol.   We are checking your cholesterol and thyroid function today.  If they are normal, Dr Mikayla Chiusano will send you a letter with the results.  If there is an abnormality, Dr Sibel Khurana will call you.  Consider taking either a combination pill of Calcium with Vitamin D, or taking a Vitamin D (cholecalciferol) 1000 units once a day to keep your muscles strong and your bones healthy.  It would be good to take a calcium supplement with the Vitamin D.   If your jaw pain, palpitations, or headaches gets worse, let Dr Akeila Lana know.

## 2018-10-03 ENCOUNTER — Encounter: Payer: Self-pay | Admitting: Family Medicine

## 2018-10-03 DIAGNOSIS — E7800 Pure hypercholesterolemia, unspecified: Secondary | ICD-10-CM

## 2018-10-03 HISTORY — DX: Pure hypercholesterolemia, unspecified: E78.00

## 2018-10-03 LAB — LIPID PANEL
Chol/HDL Ratio: 4.5 ratio — ABNORMAL HIGH (ref 0.0–4.4)
Cholesterol, Total: 259 mg/dL — ABNORMAL HIGH (ref 100–199)
HDL: 58 mg/dL (ref >39)
LDL Calculated: 189 mg/dL — ABNORMAL HIGH (ref 0–99)
Triglycerides: 60 mg/dL (ref 0–149)
VLDL Cholesterol Cal: 12 mg/dL (ref 5–40)

## 2018-10-03 LAB — TSH: TSH: 0.358 u[IU]/mL — ABNORMAL LOW (ref 0.450–4.500)

## 2018-10-03 NOTE — Assessment & Plan Note (Signed)
Established problem. Stable. Only mild.  Patient controls with pads. Declined Pelvic Rehab referral.  (+) Kegels

## 2018-10-03 NOTE — Assessment & Plan Note (Signed)
Established problem. Stable. Pt has declined use of corticosteroid therapy by Dr Adela Lank in past.  She uses Immodium daily that helps some in reducing freqeuncy of BMs Patient maintains hydration.  No perineum/perianal skin injuries.  Monitor for exacerbations.

## 2018-10-03 NOTE — Assessment & Plan Note (Signed)
Established problem Pt declined statins in past for increased CAD risk.  ASCVD 10 yr risk 17.9%.   Currently on Red Yeast Rice and omega-3 fatty acids Declines Statin therapy.

## 2018-10-03 NOTE — Progress Notes (Signed)
Subjective:    Patient ID: [name], female    DOB: 1946/03/07, 73 y.o.   MRN: [id number] RAYLEA HOLTZAPPLE is alone Sources of clinical information for visit is/are patient and past medical records. Nursing assessment for this office visit was reviewed with the patient for accuracy and revision.   Previous Report(s) Reviewed: historical medical records  Depression screen [facility] 2/9 10/02/2018  Decreased Interest 0  Down, Depressed, Hopeless 0  PHQ - 2 Score 0  Altered sleeping -  Tired, decreased energy -  Change in appetite -  Feeling bad or failure about yourself  -  Trouble concentrating -  Moving slowly or fidgety/restless -  Suicidal thoughts -  PHQ-9 Score -  Difficult doing work/chores -  Some recent data might be hidden   Fall Risk  10/02/2018 05/09/2018 05/09/2017 04/04/2017 03/14/2017  Falls in the past year? 0 No Yes Yes -  Number falls in past yr: - - 1 1 (No Data)  Comment - - - - Uneven sidewalk  Injury with Fall? - - Yes Yes -  Comment - - - - -  Risk Factor Category  - - High Fall Risk High Fall Risk -  Risk for fall due to : - - History of fall(s) - -  Risk for fall due to: Comment - - - - -  Follow up - - Falls prevention discussed Falls prevention discussed -    History/P.E. limitations: none  Adult vaccines due  Topic Date Due  . TETANUS/TDAP  05/04/2018   There are no preventive care reminders to display for this patient.  Health Maintenance Due  Topic Date Due  . Janet Berlin  05/04/2018     Chief Complaint  Patient presents with  . Back Pain     HPI  Problem List Items Addressed This Visit      Medium   Microscopic colitis causing her diarrhea noted on colonoscopy, specifically collagenous colitis (Chronic) - Duration: many years - Management: immodium - Severity: 4 to 8 "oatmeal stools a day" - Associated Symptoms: (+) continent for stool    Essential hypertension CHRONIC HYPERTENSION  Disease Monitoring  Blood pressure range: less  than 140 SBP at home  Chest pain: no   Dyspnea: no   Claudication: no   Medication compliance: yes, atenolol and spironolactone  Medication Side Effects  Lightheadedness: no   Urinary frequency: no   Edema: no     Preventitive Healthcare:  Exercise: no, multiple stressors have kept her too occupied for formal exercise.  Remains active.    Diet Pattern: Good  Salt Restriction: no       Low   Hyperthyroidism, subclinical  symptoms include  headache, jitteriness, palpitations.  Symptoms have been present for on and off for months . The patient denies tender neck / sore throat. Prior studies include FT I, TSH.       Unprioritized   Stress incontinence - Duration: many years - Management: pads - Severity: very mild - Associated Symptoms: occasional damp underwear    Other Visit Diagnoses    Pure hypercholesterolemia    -  Primary - onset years ago. - Has declined use of statins recommended by cardiology.  Fears side effects.   Relevant Orders   TSH (Completed)   Lipid Panel (Completed)   Need for Tdap vaccination       Relevant Medications   Tdap (BOOSTRIX) injection 0.5 mL     GERD - Duration: years ago - Management: prn  use famotidine with good results - Severity: minimal - Associated Symptoms: no dysphagia, no dyspepsia, no weight loss, no melena  Jaw Pain Onset: over 6 months ago Location: just under left lower Jaw line Quality: ache Duration: last seconds to a minute, occurs once or twice a week Severity: mild Function: none Pattern: intermittent Course: stable Radiation: no Relief: none tried Precipitant: at rest or with usual activities No pain with chewing.  No painful teeth.  No teeth pain with drinking cold liquids.   Trauma (Acute or Chronic): none Prior Diagnostic Testing or Treatments: seen by Dr Mendel Ryder (Card) 04/17/18 w EKG NSR WNL  Relevant PMH/PSH:  Coronary calcification on Chest CT   SH: no smoking   Review of Systems See hpi      Objective:   Physical Exam VS reviewed GEN: Alert, Cooperative, Groomed, NAD HEENT: PERRL; EAC bilaterally not occluded, TM's translucent with normal LM, (+) LR;                No cervical LAN, No thyromegaly, No palpable masses COR: RRR, No M/G/R, No JVD, Normal PMI size and location LUNGS: BCTA, No Acc mm use, speaking in full sentences EXT: No peripheral leg edema. Palpable bilateral pedal pulses.  Gait: Normal speed, No significant path deviation, Step through + Psych: Normal affect/thought/speech/language    Assessment & Plan:  Visit Problem List with A/P  Microscopic colitis causing her diarrhea noted on colonoscopy, specifically collagenous colitis Established problem. Stable. Pt has declined use of corticosteroid therapy by Dr Adela Lank in past.  She uses Immodium daily that helps some in reducing freqeuncy of BMs Patient maintains hydration.  No perineum/perianal skin injuries.  Monitor for exacerbations.   Essential hypertension Established problem Adequate blood pressure control.  No evidence of new end organ damage.  Tolerating medication without significant adverse effects.  Plan to continue current blood pressure regiment.    GASTROESOPHAGEAL REFLUX DISEASE Established problem. Stable. Continue current therapy PRN H2B OTC use  Hyperthyroidism, subclinical Lab Results  Component Value Date   TSH 0.358 (L) 10/02/2018   Continued mildly low TSH.  Will add FT4 level to tube.     Stress incontinence Established problem. Stable. Only mild.  Patient controls with pads. Declined Pelvic Rehab referral.  (+) Kegels  Pure hypercholesterolemia Established problem Pt declined statins in past for increased CAD risk.  ASCVD 10 yr risk 17.9%.   Currently on Red Yeast Rice and omega-3 fatty acids Declines Statin therapy.

## 2018-10-03 NOTE — Assessment & Plan Note (Signed)
Established problem. Stable. Continue current therapy PRN H2B OTC use

## 2018-10-03 NOTE — Assessment & Plan Note (Signed)
Established problem. Adequate blood pressure control.  No evidence of new end organ damage.  Tolerating medication without significant adverse effects.  Plan to continue current blood pressure regiment.  ] 

## 2018-10-03 NOTE — Assessment & Plan Note (Signed)
Lab Results  Component Value Date   TSH 0.358 (L) 10/02/2018   Continued mildly low TSH.  Will add FT4 level to tube.

## 2018-10-09 ENCOUNTER — Ambulatory Visit: Payer: Medicare Other | Admitting: Family Medicine

## 2018-11-12 ENCOUNTER — Ambulatory Visit: Payer: Medicare Other

## 2018-12-18 ENCOUNTER — Other Ambulatory Visit: Payer: Self-pay

## 2018-12-18 DIAGNOSIS — J9801 Acute bronchospasm: Secondary | ICD-10-CM

## 2018-12-18 MED ORDER — ALBUTEROL SULFATE HFA 108 (90 BASE) MCG/ACT IN AERS: 2.0000 | INHALATION_SPRAY | Freq: Four times a day (QID) | RESPIRATORY_TRACT | 2 refills | Status: DC | PRN

## 2018-12-18 NOTE — Telephone Encounter (Signed)
Pt called nurse line requesting for PCP to send in an inhaler to her local pharmacy. Pt stated this was something her previous pcp did for her on occasion when her allergies were acting up. Pt does endorse some "chest tightness" when she wakes up, however denies any cough or SOB. Advise her I would send to PCP.

## 2018-12-18 NOTE — Telephone Encounter (Signed)
Pt informed. Deseree Blount, CMA  

## 2018-12-18 NOTE — Telephone Encounter (Signed)
Please notify patient that prescription for inhaler was sent into their pharmacy.

## 2019-04-07 ENCOUNTER — Encounter: Payer: Self-pay | Admitting: Gastroenterology

## 2019-05-07 ENCOUNTER — Other Ambulatory Visit: Payer: Self-pay | Admitting: Family Medicine

## 2019-05-07 DIAGNOSIS — Z1231 Encounter for screening mammogram for malignant neoplasm of breast: Secondary | ICD-10-CM

## 2019-05-15 ENCOUNTER — Other Ambulatory Visit: Payer: Self-pay | Admitting: Interventional Cardiology

## 2019-05-15 DIAGNOSIS — I493 Ventricular premature depolarization: Secondary | ICD-10-CM

## 2019-05-20 ENCOUNTER — Encounter: Payer: Self-pay | Admitting: Gastroenterology

## 2019-05-20 ENCOUNTER — Other Ambulatory Visit: Payer: Self-pay

## 2019-05-20 ENCOUNTER — Telehealth: Payer: Self-pay

## 2019-05-20 ENCOUNTER — Other Ambulatory Visit (INDEPENDENT_AMBULATORY_CARE_PROVIDER_SITE_OTHER): Payer: Medicare Other

## 2019-05-20 ENCOUNTER — Ambulatory Visit (INDEPENDENT_AMBULATORY_CARE_PROVIDER_SITE_OTHER): Payer: Medicare Other | Admitting: Gastroenterology

## 2019-05-20 VITALS — BP 100/70 | HR 68 | Temp 98.3°F | Ht 62.0 in | Wt 157.0 lb

## 2019-05-20 DIAGNOSIS — K52831 Collagenous colitis: Secondary | ICD-10-CM | POA: Diagnosis not present

## 2019-05-20 DIAGNOSIS — K317 Polyp of stomach and duodenum: Secondary | ICD-10-CM

## 2019-05-20 LAB — CBC WITH DIFFERENTIAL/PLATELET
Basophils Absolute: 0 10*3/uL (ref 0.0–0.1)
Basophils Relative: 0.8 % (ref 0.0–3.0)
Eosinophils Absolute: 0.2 10*3/uL (ref 0.0–0.7)
Eosinophils Relative: 4.6 % (ref 0.0–5.0)
HCT: 30.5 % — ABNORMAL LOW (ref 36.0–46.0)
Hemoglobin: 10.3 g/dL — ABNORMAL LOW (ref 12.0–15.0)
Lymphocytes Relative: 33.2 % (ref 12.0–46.0)
Lymphs Abs: 1.6 10*3/uL (ref 0.7–4.0)
MCHC: 33.6 g/dL (ref 30.0–36.0)
MCV: 92.3 fl (ref 78.0–100.0)
Monocytes Absolute: 0.6 10*3/uL (ref 0.1–1.0)
Monocytes Relative: 11.4 % (ref 3.0–12.0)
Neutro Abs: 2.5 10*3/uL (ref 1.4–7.7)
Neutrophils Relative %: 50 % (ref 43.0–77.0)
Platelets: 368 10*3/uL (ref 150.0–400.0)
RBC: 3.31 Mil/uL — ABNORMAL LOW (ref 3.87–5.11)
RDW: 13.5 % (ref 11.5–15.5)
WBC: 4.9 10*3/uL (ref 4.0–10.5)

## 2019-05-20 LAB — COMPREHENSIVE METABOLIC PANEL
ALT: 8 U/L (ref 0–35)
AST: 13 U/L (ref 0–37)
Albumin: 3.9 g/dL (ref 3.5–5.2)
Alkaline Phosphatase: 82 U/L (ref 39–117)
BUN: 10 mg/dL (ref 6–23)
CO2: 24 mEq/L (ref 19–32)
Calcium: 9.5 mg/dL (ref 8.4–10.5)
Chloride: 108 mEq/L (ref 96–112)
Creatinine, Ser: 1.08 mg/dL (ref 0.40–1.20)
GFR: 60.03 mL/min (ref >60.00)
Glucose, Bld: 87 mg/dL (ref 70–99)
Potassium: 3.9 mEq/L (ref 3.5–5.1)
Sodium: 138 mEq/L (ref 135–145)
Total Bilirubin: 0.5 mg/dL (ref 0.2–1.2)
Total Protein: 7.5 g/dL (ref 6.0–8.3)

## 2019-05-20 MED ORDER — DICYCLOMINE HCL 10 MG PO CAPS: 10.0000 mg | ORAL_CAPSULE | Freq: Three times a day (TID) | ORAL | 3 refills | Status: DC | PRN

## 2019-05-20 NOTE — Telephone Encounter (Signed)
See phone note

## 2019-05-20 NOTE — Telephone Encounter (Signed)
Called patient and she is in agreement to office visit with Dr. Rush Landmark on 06/23/19 @ 1:50pm.
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
## 2019-05-20 NOTE — Telephone Encounter (Signed)
-----   Message from Yetta Flock, MD sent at 05/20/2019  1:21 PM EDT ----- Regarding: RE: gastric polyp question Thanks Gabe. Yes #6 and 7 are forward viewed. I will touch base with her and book her a clinic visit with you to discuss. Thanks much.   Sherlynn Stalls, can you please notify the patient I spoke with Dr. Rush Landmark as we discussed this morning. He would like to see her in the office first to discuss this and then set her up for an EGD to remove the gastric polyp. Can you please let her know and help out with scheduling? Thanks    Richardson Landry ----- Message ----- From: Irving Copas., MD Sent: 05/20/2019  12:08 PM EDT To: Yetta Flock, MD Subject: RE: gastric polyp question                     Steve,Thanks for reaching out.Looking at pictures 6 and 7 is that a forward view, correct? I think a retroflexed resection of this is possible as well.I be happy to move forward with doing an endoscopic resection of the polyp.  Probably would also go ahead and just do gastric mapping biopsies if this turns out to be adenomatous so that we can have a sense of need for surveillance moving forward.Once you have spoken with the patient if she would like to move forward, let me know and Patty and I can work on scheduling a quick clinic visit and then setting her up for an EGD with EMR.Thanks.GM ----- Message ----- From: Yetta Flock, MD Sent: 05/20/2019  11:36 AM EDT To: Irving Copas., MD Subject: gastric polyp question                         Sloan Leiter. I did an EGD on this lady in 2019, she has a pretty large sessile gastric fundic gland polyp. Larger than others I have seen in the past, no dysplasia on biopsies. May be in a challenging location, fundus / proximal stomach. Thought it may be reasonable to remove given it's size, but wanted to get your take to see if it's worth it and if you were interested. Thanks  Richardson Landry

## 2019-05-20 NOTE — Telephone Encounter (Signed)
-----   Message from Irving Copas., MD sent at 05/20/2019  2:16 PM EDT ----- Regarding: RE: gastric polyp question Patty, if need be we can use an overbook in the afternoon at 350. Thanks. GM ----- Message ----- From: Yetta Flock, MD Sent: 05/20/2019   1:21 PM EDT To: Irving Copas., MD, Hughie Closs, RN Subject: RE: gastric polyp question                     Thanks Gabe. Yes #6 and 7 are forward viewed. I will touch base with her and book her a clinic visit with you to discuss. Thanks much.   Sherlynn Stalls, can you please notify the patient I spoke with Dr. Rush Landmark as we discussed this morning. He would like to see her in the office first to discuss this and then set her up for an EGD to remove the gastric polyp. Can you please let her know and help out with scheduling? Thanks    Richardson Landry ----- Message ----- From: Irving Copas., MD Sent: 05/20/2019  12:08 PM EDT To: Yetta Flock, MD Subject: RE: gastric polyp question                     Steve,Thanks for reaching out.Looking at pictures 6 and 7 is that a forward view, correct? I think a retroflexed resection of this is possible as well.I be happy to move forward with doing an endoscopic resection of the polyp.  Probably would also go ahead and just do gastric mapping biopsies if this turns out to be adenomatous so that we can have a sense of need for surveillance moving forward.Once you have spoken with the patient if she would like to move forward, let me know and Patty and I can work on scheduling a quick clinic visit and then setting her up for an EGD with EMR.Thanks.GM ----- Message ----- From: Yetta Flock, MD Sent: 05/20/2019  11:36 AM EDT To: Irving Copas., MD Subject: gastric polyp question                         Sloan Leiter. I did an EGD on this lady in 2019, she has a pretty large sessile gastric fundic gland polyp. Larger than others I have seen in the past, no dysplasia on  biopsies. May be in a challenging location, fundus / proximal stomach. Thought it may be reasonable to remove given it's size, but wanted to get your take to see if it's worth it and if you were interested. Thanks  Richardson Landry

## 2019-05-20 NOTE — Patient Instructions (Addendum)
If you are age 74 or older, your body mass index should be between 23-30. Your Body mass index is 28.72 kg/m. If this is out of the aforementioned range listed, please consider follow up with your Primary Care Provider.  If you are age 57 or younger, your body mass index should be between 19-25. Your Body mass index is 28.72 kg/m. If this is out of the aformentioned range listed, please consider follow up with your Primary Care Provider.   To help prevent the possible spread of infection to our patients, communities, and staff; we will be implementing the following measures:  As of now we are not allowing any visitors/family members to accompany you to any upcoming appointments with [facility]. If you have any concerns about this please contact our office to discuss prior to the appointment.   Please go to the lab in the basement of our building to have lab work done as you leave today. Hit "B" for basement when you get on the elevator.  When the doors open the lab is on your left.  We will call you with the results. Thank you.  You can take Pepto-Bismol up to 4 times a day as needed.  You can take Imodium, over-the-counter, as needed.  We have sent the following medications to your pharmacy for you to pick up at your convenience: Bentyl 10mg : Take one to two tablets every 8 hours as needed  Thank you for entrusting me with your care and for choosing [facility], Dr. Flora Cellar

## 2019-05-21 ENCOUNTER — Other Ambulatory Visit: Payer: Self-pay

## 2019-05-21 DIAGNOSIS — D649 Anemia, unspecified: Secondary | ICD-10-CM

## 2019-05-26 ENCOUNTER — Encounter: Payer: Self-pay | Admitting: Family Medicine

## 2019-05-27 ENCOUNTER — Other Ambulatory Visit (INDEPENDENT_AMBULATORY_CARE_PROVIDER_SITE_OTHER): Payer: Medicare Other

## 2019-05-27 DIAGNOSIS — D649 Anemia, unspecified: Secondary | ICD-10-CM

## 2019-05-27 LAB — B12 AND FOLATE PANEL
Folate: >24.7 ng/mL (ref >5.9)
Vitamin B-12: 165 pg/mL — ABNORMAL LOW (ref 211–911)

## 2019-05-28 LAB — IRON,TIBC AND FERRITIN PANEL
%SAT: 21 % (calc) (ref 16–45)
Ferritin: 42 ng/mL (ref 16–288)
Iron: 64 ug/dL (ref 45–160)
TIBC: 299 mcg/dL (calc) (ref 250–450)

## 2019-05-29 ENCOUNTER — Other Ambulatory Visit: Payer: Self-pay

## 2019-05-29 ENCOUNTER — Telehealth: Payer: Self-pay

## 2019-05-29 ENCOUNTER — Other Ambulatory Visit: Payer: Self-pay | Admitting: Interventional Cardiology

## 2019-05-29 MED ORDER — CYANOCOBALAMIN 1000 MCG/ML IJ SOLN: 1000.0000 ug | INTRAMUSCULAR | 1 refills | Status: DC

## 2019-05-29 NOTE — Telephone Encounter (Signed)
Order sent to patient's pharmacy for B12 inj. Tried all patient's phone#s and unable to reach patient (either mail box full or no voice mail) will try again later

## 2019-05-29 NOTE — Telephone Encounter (Signed)
-----   Message from Yetta Flock, MD sent at 05/29/2019  1:36 PM EDT ----- Sorry should have clarified. One per week for 1 month, then once monthly. Thanks  ----- Message ----- From: Hughie Closs, RN Sent: 05/29/2019  10:26 AM EDT To: Yetta Flock, MD  How long to you want pt. On once a week ?   Sherlynn Stalls ----- Message ----- From: Yetta Flock, MD Sent: 05/28/2019   5:10 PM EDT To: Hughie Closs, RN  Sanaiyah Kirchhoff can you relay the following: -Patient's labs shows that she has a B12 deficiency which is causing her anemia. Recommend 1000 mcg parenterally once per week and then once per month.  Can you please order this for her, she will probably need teaching on how to do this at home.  Otherwise she is scheduled to follow-up with Dr. Rush Landmark regarding gastric polyp.  Thanks

## 2019-06-03 ENCOUNTER — Telehealth: Payer: Self-pay | Admitting: Gastroenterology

## 2019-06-03 ENCOUNTER — Other Ambulatory Visit: Payer: Self-pay | Admitting: Interventional Cardiology

## 2019-06-03 DIAGNOSIS — I493 Ventricular premature depolarization: Secondary | ICD-10-CM

## 2019-06-03 NOTE — Telephone Encounter (Signed)
Called patient's pharmacy and they do not see the order for B12. I gave a verbal order and called patient to let her know they said it should be ready this evening

## 2019-06-04 ENCOUNTER — Telehealth: Payer: Self-pay

## 2019-06-04 NOTE — Telephone Encounter (Signed)
Patient came into the office with her daughter for B12 injection teaching. Gave patient's first injection in the right thigh. No complications and patient nervous but tolerated it well. Daughter planning to give the patient's injections

## 2019-06-11 ENCOUNTER — Encounter: Payer: Self-pay | Admitting: Family Medicine

## 2019-06-11 ENCOUNTER — Ambulatory Visit (INDEPENDENT_AMBULATORY_CARE_PROVIDER_SITE_OTHER): Payer: Medicare Other | Admitting: Family Medicine

## 2019-06-11 ENCOUNTER — Other Ambulatory Visit: Payer: Self-pay

## 2019-06-11 VITALS — BP 134/68 | HR 58 | Wt 160.0 lb

## 2019-06-11 DIAGNOSIS — E648 Sequelae of other nutritional deficiencies: Secondary | ICD-10-CM | POA: Diagnosis not present

## 2019-06-11 DIAGNOSIS — E7800 Pure hypercholesterolemia, unspecified: Secondary | ICD-10-CM

## 2019-06-11 DIAGNOSIS — I10 Essential (primary) hypertension: Secondary | ICD-10-CM

## 2019-06-11 DIAGNOSIS — Z1321 Encounter for screening for nutritional disorder: Secondary | ICD-10-CM

## 2019-06-11 DIAGNOSIS — E538 Deficiency of other specified B group vitamins: Secondary | ICD-10-CM | POA: Diagnosis not present

## 2019-06-11 DIAGNOSIS — R7989 Other specified abnormal findings of blood chemistry: Secondary | ICD-10-CM | POA: Diagnosis not present

## 2019-06-11 DIAGNOSIS — Z23 Encounter for immunization: Secondary | ICD-10-CM

## 2019-06-11 DIAGNOSIS — Z79899 Other long term (current) drug therapy: Secondary | ICD-10-CM | POA: Diagnosis not present

## 2019-06-11 DIAGNOSIS — E0590 Thyrotoxicosis, unspecified without thyrotoxic crisis or storm: Secondary | ICD-10-CM

## 2019-06-11 DIAGNOSIS — E162 Hypoglycemia, unspecified: Secondary | ICD-10-CM | POA: Diagnosis not present

## 2019-06-11 DIAGNOSIS — G3184 Mild cognitive impairment, so stated: Secondary | ICD-10-CM

## 2019-06-11 DIAGNOSIS — T781XXA Other adverse food reactions, not elsewhere classified, initial encounter: Secondary | ICD-10-CM

## 2019-06-11 LAB — POCT GLYCOSYLATED HEMOGLOBIN (HGB A1C): Hemoglobin A1C: 5 % (ref 4.0–5.6)

## 2019-06-11 MED ORDER — CYANOCOBALAMIN 1000 MCG/ML IJ SOLN
1000.0000 ug | Freq: Once | INTRAMUSCULAR | Status: AC
  Administered 2019-06-11: 1000 ug via INTRAMUSCULAR

## 2019-06-11 NOTE — Patient Instructions (Signed)
Dr Ezell Poke will call you if your tests are not good. Otherwise he will send you a letter.  If you sign up for MyChart online, you will be able to see your test results once Dr Trenika Hudson has reviewed them.  If you do not hear from [place] with in 2 weeks please call our office  Your blood pressure control is good  We are checking you for diabetes today.  We have made a referral to the Allergist specialists to look into your reaction to peanut butter.   You are getting yor Tetanus booster and Flu caccination today.   Dr Azell Bill would like to see you again in about 6 months.

## 2019-06-12 LAB — CMP14+EGFR
ALT: 9 IU/L (ref 0–32)
AST: 15 IU/L (ref 0–40)
Albumin/Globulin Ratio: 1.2 (ref 1.2–2.2)
Albumin: 3.7 g/dL (ref 3.7–4.7)
Alkaline Phosphatase: 105 IU/L (ref 39–117)
BUN/Creatinine Ratio: 8 — ABNORMAL LOW (ref 12–28)
BUN: 9 mg/dL (ref 8–27)
Bilirubin Total: 0.4 mg/dL (ref 0.0–1.2)
CO2: 21 mmol/L (ref 20–29)
Calcium: 9.4 mg/dL (ref 8.7–10.3)
Chloride: 106 mmol/L (ref 96–106)
Creatinine, Ser: 1.12 mg/dL — ABNORMAL HIGH (ref 0.57–1.00)
GFR calc Af Amer: 56 mL/min/{1.73_m2} — ABNORMAL LOW (ref >59)
GFR calc non Af Amer: 49 mL/min/{1.73_m2} — ABNORMAL LOW (ref >59)
Globulin, Total: 3 g/dL (ref 1.5–4.5)
Glucose: 85 mg/dL (ref 65–99)
Potassium: 3.6 mmol/L (ref 3.5–5.2)
Sodium: 140 mmol/L (ref 134–144)
Total Protein: 6.7 g/dL (ref 6.0–8.5)

## 2019-06-12 LAB — CBC
Hematocrit: 29.6 % — ABNORMAL LOW (ref 34.0–46.6)
Hemoglobin: 10.1 g/dL — ABNORMAL LOW (ref 11.1–15.9)
MCH: 30.6 pg (ref 26.6–33.0)
MCHC: 34.1 g/dL (ref 31.5–35.7)
MCV: 90 fL (ref 79–97)
Platelets: 391 10*3/uL (ref 150–450)
RBC: 3.3 x10E6/uL — ABNORMAL LOW (ref 3.77–5.28)
RDW: 13.1 % (ref 11.7–15.4)
WBC: 4.9 10*3/uL (ref 3.4–10.8)

## 2019-06-12 LAB — LIPID PANEL
Chol/HDL Ratio: 4.8 ratio — ABNORMAL HIGH (ref 0.0–4.4)
Cholesterol, Total: 257 mg/dL — ABNORMAL HIGH (ref 100–199)
HDL: 54 mg/dL (ref >39)
LDL Chol Calc (NIH): 193 mg/dL — ABNORMAL HIGH (ref 0–99)
Triglycerides: 65 mg/dL (ref 0–149)
VLDL Cholesterol Cal: 10 mg/dL (ref 5–40)

## 2019-06-12 LAB — T4, FREE: Free T4: 1.2 ng/dL (ref 0.82–1.77)

## 2019-06-12 LAB — T3, FREE: T3, Free: 2.9 pg/mL (ref 2.0–4.4)

## 2019-06-12 LAB — TSH: TSH: 0.502 u[IU]/mL (ref 0.450–4.500)

## 2019-06-12 LAB — VITAMIN D 25 HYDROXY (VIT D DEFICIENCY, FRACTURES): Vit D, 25-Hydroxy: 18.5 ng/mL — ABNORMAL LOW (ref 30.0–100.0)
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
## 2019-06-15 ENCOUNTER — Ambulatory Visit (INDEPENDENT_AMBULATORY_CARE_PROVIDER_SITE_OTHER): Payer: Medicare Other | Admitting: Interventional Cardiology

## 2019-06-15 ENCOUNTER — Other Ambulatory Visit: Payer: Self-pay

## 2019-06-15 ENCOUNTER — Encounter: Payer: Self-pay | Admitting: Interventional Cardiology

## 2019-06-15 VITALS — BP 118/64 | HR 66 | Ht 62.0 in | Wt 159.0 lb

## 2019-06-15 DIAGNOSIS — G3184 Mild cognitive impairment, so stated: Secondary | ICD-10-CM

## 2019-06-15 DIAGNOSIS — I2510 Atherosclerotic heart disease of native coronary artery without angina pectoris: Secondary | ICD-10-CM

## 2019-06-15 DIAGNOSIS — I493 Ventricular premature depolarization: Secondary | ICD-10-CM | POA: Diagnosis not present

## 2019-06-15 DIAGNOSIS — E785 Hyperlipidemia, unspecified: Secondary | ICD-10-CM | POA: Diagnosis not present

## 2019-06-15 DIAGNOSIS — Z7189 Other specified counseling: Secondary | ICD-10-CM

## 2019-06-15 DIAGNOSIS — I10 Essential (primary) hypertension: Secondary | ICD-10-CM

## 2019-06-15 NOTE — Patient Instructions (Signed)
Medication Instructions:  Your physician recommends that you continue on your current medications as directed. Please refer to the Current Medication list given to you today.  If you need a refill on your cardiac medications before your next appointment, please call your pharmacy.   Lab work: None If you have labs (blood work) drawn today and your tests are completely normal, you will receive your results only by: . MyChart Message (if you have MyChart) OR . A paper copy in the mail If you have any lab test that is abnormal or we need to change your treatment, we will call you to review the results.  Testing/Procedures: None  Follow-Up: At [facility], you and your health needs are our priority.  As part of our continuing mission to provide you with exceptional heart care, we have created designated Provider Care Teams.  These Care Teams include your primary Cardiologist (physician) and Advanced Practice Providers (APPs -  Physician Assistants and Nurse Practitioners) who all work together to provide you with the care you need, when you need it. You will need a follow up appointment in 12 months.  Please call our office 2 months in advance to schedule this appointment.  You may see Henry W Smith III, MD or one of the following Advanced Practice Providers on your designated Care Team:   Lori Gerhardt, NP Laura Ingold, NP . Jill McDaniel, NP  Any Other Special Instructions Will Be Listed Below (If Applicable).  You have been referred to the Lipid Clinic here in our office.    

## 2019-06-16 ENCOUNTER — Encounter: Payer: Self-pay | Admitting: Family Medicine

## 2019-06-16 NOTE — Progress Notes (Signed)
x

## 2019-06-18 ENCOUNTER — Other Ambulatory Visit: Payer: Self-pay

## 2019-06-18 ENCOUNTER — Ambulatory Visit: Payer: Medicare Other

## 2019-06-18 DIAGNOSIS — E538 Deficiency of other specified B group vitamins: Secondary | ICD-10-CM

## 2019-06-18 NOTE — Progress Notes (Signed)
Patient presents in nurse clinic with her prescribed b12 medication. Patient stated she is to inject herself 1x a week. However is scared to inject herself. Cyanocobalamin 1051mcg/1ml was prescribed by Dr. [place] Cellar. Ok was given by McDiarmid at last PCP visit to have [place] inject medication for her.  B12 was drawn up from her prescribed medication vial. Injection given in LD, patient tolerated well.  Per instructions on prescription: Patient is to inject 82ml into muscle 1x weekly for 1 month, then inject once a month.  Patient as of today has had 2 injections. 3 week of injections due next Thursday. Patient told to make a nurse visit.
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
## 2019-06-22 ENCOUNTER — Ambulatory Visit (INDEPENDENT_AMBULATORY_CARE_PROVIDER_SITE_OTHER): Payer: Medicare Other

## 2019-06-22 ENCOUNTER — Other Ambulatory Visit: Payer: Self-pay

## 2019-06-22 VITALS — BP 124/64 | HR 65

## 2019-06-22 DIAGNOSIS — M791 Myalgia, unspecified site: Secondary | ICD-10-CM

## 2019-06-22 DIAGNOSIS — E7800 Pure hypercholesterolemia, unspecified: Secondary | ICD-10-CM | POA: Diagnosis not present

## 2019-06-22 DIAGNOSIS — T466X5A Adverse effect of antihyperlipidemic and antiarteriosclerotic drugs, initial encounter: Secondary | ICD-10-CM | POA: Insufficient documentation

## 2019-06-22 DIAGNOSIS — I2510 Atherosclerotic heart disease of native coronary artery without angina pectoris: Secondary | ICD-10-CM | POA: Diagnosis not present

## 2019-06-22 HISTORY — DX: Adverse effect of antihyperlipidemic and antiarteriosclerotic drugs, initial encounter: T46.6X5A

## 2019-06-22 HISTORY — DX: Myalgia, unspecified site: M79.10

## 2019-06-22 MED ORDER — PRAVASTATIN SODIUM 20 MG PO TABS: 20.0000 mg | ORAL_TABLET | Freq: Every day | ORAL | 5 refills | Status: DC

## 2019-06-22 NOTE — Patient Instructions (Addendum)
Thank you for seeing [name] today!  Your "bad" cholesterol number is high at 193. We like this number to be less than 70. Please start taking pravastatin 1 tablet (20 mg) by mouth once daily in the evening. This prescription has been sent to your pharmacy. Please call if you are having any issues with this medicine.   Diet changes - please use the "My Plate" diagram printed for you in the office to use for planning your dinners.   I will call you in about 3 weeks to discuss your progress.  Please call [name] at [phone] or use MyChart messaging if you have any questions or concerns.

## 2019-06-23 ENCOUNTER — Encounter: Payer: Self-pay | Admitting: Gastroenterology

## 2019-06-23 ENCOUNTER — Ambulatory Visit: Payer: Medicare Other | Admitting: Gastroenterology

## 2019-06-23 ENCOUNTER — Ambulatory Visit
Admission: RE | Admit: 2019-06-23 | Discharge: 2019-06-23 | Disposition: A | Discharge status: Home / Self Care | Payer: Medicare Other | Source: Ambulatory Visit | Attending: Family Medicine | Admitting: Family Medicine

## 2019-06-23 VITALS — BP 130/70 | HR 66 | Temp 98.5°F | Ht 63.0 in | Wt 160.0 lb

## 2019-06-23 DIAGNOSIS — R198 Other specified symptoms and signs involving the digestive system and abdomen: Secondary | ICD-10-CM | POA: Diagnosis not present

## 2019-06-23 DIAGNOSIS — Z1231 Encounter for screening mammogram for malignant neoplasm of breast: Secondary | ICD-10-CM

## 2019-06-23 DIAGNOSIS — K317 Polyp of stomach and duodenum: Secondary | ICD-10-CM | POA: Diagnosis not present

## 2019-06-23 NOTE — Patient Instructions (Signed)
If you are age 74 or older, your body mass index should be between 23-30. Your Body mass index is 28.34 kg/m. If this is out of the aforementioned range listed, please consider follow up with your Primary Care Provider.  It has been recommended to you by your physician that you have a(n) Colonoscopy+EMR to remove Gastric Polyp completed. Per your request, we did not schedule the procedure(s) today. Please contact our office at [phone] should you decide to have the procedure completed. You will be scheduled for a pre-visit and procedure at that time.  Thank you for choosing me and [facility].  Dr. Rush Landmark

## 2019-06-24 ENCOUNTER — Encounter: Payer: Self-pay | Admitting: Gastroenterology
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
## 2019-06-25 DIAGNOSIS — R198 Other specified symptoms and signs involving the digestive system and abdomen: Secondary | ICD-10-CM | POA: Insufficient documentation

## 2019-06-26 ENCOUNTER — Other Ambulatory Visit: Payer: Self-pay | Admitting: Gastroenterology

## 2019-06-26 ENCOUNTER — Other Ambulatory Visit: Payer: Self-pay | Admitting: Interventional Cardiology

## 2019-06-26 ENCOUNTER — Ambulatory Visit: Payer: Medicare Other

## 2019-06-26 ENCOUNTER — Ambulatory Visit (INDEPENDENT_AMBULATORY_CARE_PROVIDER_SITE_OTHER): Payer: Medicare Other | Admitting: *Deleted

## 2019-06-26 ENCOUNTER — Other Ambulatory Visit: Payer: Self-pay

## 2019-06-26 DIAGNOSIS — E538 Deficiency of other specified B group vitamins: Secondary | ICD-10-CM

## 2019-06-26 MED ORDER — CYANOCOBALAMIN 1000 MCG/ML IJ SOLN
1000.0000 ug | Freq: Once | INTRAMUSCULAR | Status: AC
  Administered 2019-06-26: 1000 ug via INTRAMUSCULAR

## 2019-06-26 NOTE — Progress Notes (Signed)
Pt is here for 4 of 4 weekly injections of B12.  She will now start monthly injections per notes.  PT BRINGS HER OWN SUPPLY OF MEDICATION FROM PHARMACY  Injected into right deltoid.  Pt tolerated well. Christen Bame, CMA

## 2019-06-30 ENCOUNTER — Telehealth: Payer: Self-pay | Admitting: Pharmacist

## 2019-06-30 ENCOUNTER — Encounter: Payer: Self-pay | Admitting: Family Medicine

## 2019-06-30 DIAGNOSIS — Z8719 Personal history of other diseases of the digestive system: Secondary | ICD-10-CM

## 2019-06-30 HISTORY — DX: Personal history of other diseases of the digestive system: Z87.19

## 2019-06-30 NOTE — Telephone Encounter (Signed)
Pt called clinic and reports headache and muscle aches since starting her pravastatin over the past week. Discussed PCSK9i therapy again with pt, she thinks her neighbor who is an Therapist, sports could help with injections. Will submit prior authorization for Repatha. Once approved, will follow up with pt regarding copay information.

## 2019-07-01 MED ORDER — REPATHA SURECLICK 140 MG/ML ~~LOC~~ SOAJ: 1.0000 "pen " | SUBCUTANEOUS | 11 refills | Status: DC

## 2019-07-01 NOTE — Telephone Encounter (Signed)
[place] assistance application was approved. Co-pay card information was provided to [facility] and copay is now $0. Patient has been contacted and will pick up [place] in 2 days. Discussed administration technique and storage for Repatha and patient verbalized understanding. Of note, patient may be switching her medical insurance to [facility] in the future.

## 2019-07-07 ENCOUNTER — Other Ambulatory Visit: Payer: Self-pay | Admitting: Interventional Cardiology

## 2019-07-07 DIAGNOSIS — E876 Hypokalemia: Secondary | ICD-10-CM

## 2019-07-08 MED ORDER — POTASSIUM CHLORIDE CRYS ER 20 MEQ PO TBCR: EXTENDED_RELEASE_TABLET | ORAL | 3 refills | Status: DC

## 2019-07-10 ENCOUNTER — Telehealth: Payer: Self-pay | Admitting: Gastroenterology

## 2019-07-10 NOTE — Telephone Encounter (Signed)
Pt wants to know if she can take Vit B12 pills that she has at home because she is starting to feel tired again and she is not yet due for her B12 injection.

## 2019-07-13 ENCOUNTER — Telehealth: Payer: Self-pay

## 2019-07-13 NOTE — Telephone Encounter (Signed)
Called patient and she has not started taking the B12 injections yet, she starts next month. But wants to know if she can take B12 PO in the mean time, and can she supplement the PO while she is getting the once a month 1,070mcq IM B-12. Please advise

## 2019-07-13 NOTE — Telephone Encounter (Signed)
Yes that's fine she can take oral B12 until she starts the injections. Thanks

## 2019-07-13 NOTE — Telephone Encounter (Signed)
Attempted calling patient back, mail box full. Will try later

## 2019-07-14 NOTE — Telephone Encounter (Signed)
Spoke to patient and let her know Dr. Havery Moros is ok with her taking B12 PO until her B12 injections start. And she wanted Dr. Havery Moros to know she wants to wait until the first of the year to get her gastric polyp removed

## 2019-07-14 NOTE — Telephone Encounter (Signed)
Okay thanks for letting me know

## 2019-07-15 DIAGNOSIS — H353131 Nonexudative age-related macular degeneration, bilateral, early dry stage: Secondary | ICD-10-CM | POA: Diagnosis not present

## 2019-07-15 DIAGNOSIS — H1045 Other chronic allergic conjunctivitis: Secondary | ICD-10-CM | POA: Diagnosis not present

## 2019-07-15 DIAGNOSIS — Z961 Presence of intraocular lens: Secondary | ICD-10-CM | POA: Diagnosis not present

## 2019-07-16 ENCOUNTER — Telehealth: Payer: Self-pay | Admitting: *Deleted

## 2019-07-16 NOTE — Telephone Encounter (Signed)
Pt is having nasal drainage which is making her throat sore.  She is sure it is from allergies and denies fever.   Her daughter brought over fluticasone and she wants to know if it is ok for her to use this with her other medications.   To PCP. Christen Bame, CMA

## 2019-07-17 NOTE — Telephone Encounter (Signed)
Ms [name] may try the fluticasone, though it may be best if she purchase her own bottle to avoid risk of infection from used bottle. Flonase (fluticasone) is available over-the-counter.   If she uses the fluticasone, it may take a week becfore she experiences improvement in her symptoms.

## 2019-07-17 NOTE — Telephone Encounter (Signed)
Spoke with patient and she states the her daughter had bought a new bottle when she dropped it off.  Patient will try this for her symptoms and aware that it could take a few days to kick in.  [facility]

## 2019-07-20 ENCOUNTER — Other Ambulatory Visit: Payer: Self-pay

## 2019-07-20 ENCOUNTER — Emergency Department
Admission: EM | Admit: 2019-07-20 | Discharge: 2019-07-20 | Disposition: A | Discharge status: Home / Self Care | Payer: Medicare Other | Attending: Emergency Medicine | Admitting: Emergency Medicine

## 2019-07-20 ENCOUNTER — Emergency Department: Payer: Medicare Other

## 2019-07-20 ENCOUNTER — Encounter: Payer: Self-pay | Admitting: Emergency Medicine

## 2019-07-20 DIAGNOSIS — W19XXXA Unspecified fall, initial encounter: Secondary | ICD-10-CM

## 2019-07-20 DIAGNOSIS — W010XXA Fall on same level from slipping, tripping and stumbling without subsequent striking against object, initial encounter: Secondary | ICD-10-CM | POA: Diagnosis not present

## 2019-07-20 DIAGNOSIS — R52 Pain, unspecified: Secondary | ICD-10-CM | POA: Diagnosis not present

## 2019-07-20 DIAGNOSIS — Z79899 Other long term (current) drug therapy: Secondary | ICD-10-CM | POA: Insufficient documentation

## 2019-07-20 DIAGNOSIS — M25461 Effusion, right knee: Secondary | ICD-10-CM | POA: Diagnosis not present

## 2019-07-20 DIAGNOSIS — I10 Essential (primary) hypertension: Secondary | ICD-10-CM | POA: Insufficient documentation

## 2019-07-20 DIAGNOSIS — Y92481 Parking lot as the place of occurrence of the external cause: Secondary | ICD-10-CM | POA: Insufficient documentation

## 2019-07-20 DIAGNOSIS — M25562 Pain in left knee: Secondary | ICD-10-CM | POA: Diagnosis not present

## 2019-07-20 DIAGNOSIS — Z743 Need for continuous supervision: Secondary | ICD-10-CM | POA: Diagnosis not present

## 2019-07-20 DIAGNOSIS — M25561 Pain in right knee: Secondary | ICD-10-CM | POA: Diagnosis present

## 2019-07-20 DIAGNOSIS — S8992XA Unspecified injury of left lower leg, initial encounter: Secondary | ICD-10-CM | POA: Diagnosis not present

## 2019-07-20 MED ORDER — ACETAMINOPHEN 500 MG PO TABS: 500.0000 mg | ORAL_TABLET | Freq: Four times a day (QID) | ORAL | 0 refills | Status: AC | PRN

## 2019-07-20 NOTE — ED Triage Notes (Signed)
Pt in via EMS from home with c/o a fall and pain to right knee worsening over the day.

## 2019-07-20 NOTE — Discharge Instructions (Signed)
Your x-ray does not show any fracture.  You have a lot of swelling in your right knee.  Please ice and elevate your knee today.  Please wear knee brace and use your crutches.  Please call orthopedics in the morning for an appointment this week.  You can take Tylenol for pain.

## 2019-07-20 NOTE — ED Notes (Signed)
See triage note  Presents s/p fall  States she missed a step and landed on both knees   Bruising and pain to both knees but increased pain to right knee

## 2019-07-20 NOTE — ED Notes (Signed)
Patient declined discharge vital signs. 

## 2019-07-20 NOTE — ED Triage Notes (Signed)
Fall this morning in a parking lot.  States tripped over parking block.  States fell down to both knees, but right knee hurts worse.
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
## 2019-07-24 ENCOUNTER — Encounter: Payer: Self-pay | Admitting: Family Medicine

## 2019-07-24 NOTE — Telephone Encounter (Signed)
Called pt to see if she has started Repatha yet to schedule next lipid check. Pt is having her neighbor come on Monday to give both B12 and Repatha injections. Will follow up with her on Tuesday to make sure she got the dose and will schedule next lipid check at that time.

## 2019-07-28 ENCOUNTER — Telehealth: Payer: Self-pay

## 2019-07-28 DIAGNOSIS — I2510 Atherosclerotic heart disease of native coronary artery without angina pectoris: Secondary | ICD-10-CM

## 2019-07-28 NOTE — Telephone Encounter (Signed)
Called pt to confirm receiving first dose of medication on 11/9. She states her neighbor gave her the shot but she was scared to hear the click sound from the pen. Patient was asking about frequency alternatives to the pen. Advised we do have a PCSK9-inhibitor that is dosed once monthly. We can look into this as an option following her first cholesterol check after 3 doses of q2week pen. Pt scheduled for fasting lipids and LFTs on 12/10. Will call pt afterward to discuss results and possibly switch to once per month pen.

## 2019-08-18 ENCOUNTER — Other Ambulatory Visit: Payer: Self-pay | Admitting: Interventional Cardiology

## 2019-08-18 ENCOUNTER — Other Ambulatory Visit: Payer: Self-pay | Admitting: Gastroenterology

## 2019-08-18 DIAGNOSIS — I493 Ventricular premature depolarization: Secondary | ICD-10-CM

## 2019-08-25 ENCOUNTER — Telehealth: Payer: Self-pay

## 2019-08-25 NOTE — Telephone Encounter (Signed)
Nurse for pt [name]) calling to inform Dr. McDiarmid that pt has a PHQ 9 score of 15. Danton Clap is asking if pt should start an SSRI. Please call pt or [name] to discuss what you would like to do. Ottis Stain, CMA

## 2019-08-26 NOTE — Telephone Encounter (Signed)
Ask Ms [name] to request that Ms [name] to schedule an app't for televisit with me or another [facility] provider to discuss this screening test finding.

## 2019-08-27 ENCOUNTER — Other Ambulatory Visit: Payer: Self-pay

## 2019-08-27 ENCOUNTER — Other Ambulatory Visit: Payer: Medicare Other | Admitting: *Deleted

## 2019-08-27 DIAGNOSIS — I2510 Atherosclerotic heart disease of native coronary artery without angina pectoris: Secondary | ICD-10-CM | POA: Diagnosis not present

## 2019-08-27 LAB — LIPID PANEL
Chol/HDL Ratio: 3.6 ratio (ref 0.0–4.4)
Cholesterol, Total: 184 mg/dL (ref 100–199)
HDL: 51 mg/dL (ref >39)
LDL Chol Calc (NIH): 121 mg/dL — ABNORMAL HIGH (ref 0–99)
Triglycerides: 63 mg/dL (ref 0–149)
VLDL Cholesterol Cal: 12 mg/dL (ref 5–40)

## 2019-08-27 LAB — HEPATIC FUNCTION PANEL
ALT: 7 IU/L (ref 0–32)
AST: 14 IU/L (ref 0–40)
Albumin: 3.9 g/dL (ref 3.7–4.7)
Alkaline Phosphatase: 89 IU/L (ref 39–117)
Bilirubin Total: 0.3 mg/dL (ref 0.0–1.2)
Bilirubin, Direct: 0.1 mg/dL (ref 0.00–0.40)
Total Protein: 6.6 g/dL (ref 6.0–8.5)

## 2019-08-27 NOTE — Telephone Encounter (Signed)
Patient made aware that she needs an appointment and video visit was scheduled for next Thursday at 10:30 with Dr. Ky Barban.  Jazmin Hartsell,CMA

## 2019-08-28 ENCOUNTER — Telehealth: Payer: Self-pay | Admitting: Pharmacist

## 2019-08-28 MED ORDER — REPATHA PUSHTRONEX SYSTEM 420 MG/3.5ML ~~LOC~~ SOCT: 1.0000 | SUBCUTANEOUS | 3 refills | Status: DC

## 2019-08-28 NOTE — Telephone Encounter (Signed)
Prior authorization for Repatha on-body infusion not required per insurance company: "This medication or product was previously approved on [id number] from 2019-06-30 to 2019-12-29"  Sent new prescription to pharmacy for on-body cartridge and will follow up linking [place] copay assistance to new order. Will call pt once completed.

## 2019-08-28 NOTE — Telephone Encounter (Signed)
Called patient to review once monthly options. If she sticks with Repatha, the device for the monthly injection is the pushtronex which is a on-body infusion which takes 9 min to infuse. I have explained to the patient how it works and how to apply. The other option would be to use Praluent 300mg  which is 2 injections of the 150mg  pen on the same day. Patient would like to stick with Repatha. I will submit a PA for Repatha Pushtronex 420mg . I will contact patient once determination is made.

## 2019-08-28 NOTE — Telephone Encounter (Signed)
[place] has been applied to Rx. Patient made aware rx will be ready on Monday. She may finish out any pens she has and then she will start the monthly infusion on the day she would be due for her next injection, then give infusion monthly thereafter. Patient appreciative of the help.

## 2019-09-01 ENCOUNTER — Telehealth: Payer: Self-pay

## 2019-09-01 NOTE — Telephone Encounter (Signed)
The pt prefers to call back after the first of the year to schedule appt.  She is aware to call early in January to get an appt.

## 2019-09-01 NOTE — Telephone Encounter (Signed)
-----   Message from Irving Copas., MD sent at 09/01/2019 10:23 AM EST ----- Regarding: Follow up if she wants procedure [name], Can you reach out to the patient and see if she has had time to think about whether she wanted to proceed with the EGD with EMR attempt that we spoke about a few months ago? Thanks. GM

## 2019-09-01 NOTE — Telephone Encounter (Signed)
Thanks for letting me know [name]

## 2019-09-01 NOTE — Telephone Encounter (Signed)
Thanks for the update. GM 

## 2019-09-03 ENCOUNTER — Telehealth (INDEPENDENT_AMBULATORY_CARE_PROVIDER_SITE_OTHER): Payer: Medicare Other | Admitting: Family Medicine

## 2019-09-03 ENCOUNTER — Other Ambulatory Visit: Payer: Self-pay

## 2019-09-03 DIAGNOSIS — F339 Major depressive disorder, recurrent, unspecified: Secondary | ICD-10-CM | POA: Diagnosis not present

## 2019-09-03 DIAGNOSIS — F39 Unspecified mood [affective] disorder: Secondary | ICD-10-CM | POA: Insufficient documentation

## 2019-09-03 NOTE — Patient Instructions (Signed)
Therapy and Counseling Resources Most providers on this list will take Medicaid. Patients with commercial insurance or Medicare should contact their insurance company to get a list of in network providers.  [facility] [address]  [place], [place] [address]       [phone]     [facility] [address], [place], [place]  [phone]    [facility] [address], [place], [place] [phone]  [facility]:  [address] [place] [phone]  [facility]:  [address], [place] [phone]  [facility] (under & uninsured) [address]   [place] [phone]    [email or web address]    [facility] [place] -[address]     Phone:  [phone]     [place]-  [address]  [phone]   [facility] [address]      [place], [place] [phone] ext [phone]  [facility]: [address], [place], [place]  [phone]   [facility] (Spanish therapist) [address]   [place] [address]    [phone]    [facility]   [address],  [place], [place]  [phone]   [facility]  [address] [place]  [phone]  [facility]  [address] [place], [place]        [phone]  [facility] under & uninsured [facility],  [address], [place] ([phone]):  Mon - Fri from 8 AM - 3 PM  [facility] [place],  (Spanish)   [address], [place]: ([phone]) 8:30 - 12; 1 - 2:30  [facility],  [address], [place]    ([phone]):8:30 - 12; 2 - 3PM  [facility] [place],  [address],  [place]; ([phone]):   Mon - Fri 8 AM - 5 PM  [facility] [address] [place]  MWF 12:30 to 3:00 or call to schedule an appointment  [phone]  Specific Provider options Psychology Today  https://www.psychologytoday.com/us 1. click on find a therapist  2. enter your zip code 3. left side and select or tailor a therapist for your specific need.   [facility] Provider Directory [email or web address]  (Medicaid)   Follow all drop down to find a provider  Social Support program [facility] [phone] or www.mhag.org [address], , [place] [facility] support and educational   In home counseling [facility] Telephone: [phone]  office in [place] [email or web address]   Does not take reg. Medicaid or Medicare private insurance [employer], [place] health Choice, [employer], [employer], [employer], [employer],  Health Choice  24- Hour Availability:  . [facility]   [phone] or 1-800-711-2635  . [facility]  Crisis Line [phone]  [facility]  [phone]   . [facility] [place] [facility]  1-866-261-5769 (after hours)  . Therapeutic [facility]/Mobile Crisis   1-877-626-1772  . [place] National Suicide Hotline  1-800-273-8255 (TALK)  . Call 911 or go to emergency room  . [facility] Crisis [facility]  (800-256-2452);  [place] and [place]   . [facility]  (800-939-5911); [place], [place], [place], [place], [place], [place], [place]   

## 2019-09-03 NOTE — Telephone Encounter (Signed)
Patient called asking if she could start her Repatha 49 on Jan 4th. Her last 140mg  injection was 12/7. She does have one more pen left. I advised her to take the last pen on 12/21 and then start the infusion on 1/4
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
## 2019-09-03 NOTE — Assessment & Plan Note (Signed)
Mild depression, chronic, indicated with PHQ9 10 today with ongoing stressors of marriage separation and often being alone during the [place] pandemic. Patient has good coping mechanisms and adequate social support. Discussed medication and counseling although patient feels she is doing ok with her coping mechanisms, believe this is reasonable given our discussion. Did provide list of counselors should she decide to establish.

## 2019-09-07 ENCOUNTER — Telehealth: Payer: Self-pay | Admitting: Interventional Cardiology

## 2019-09-07 NOTE — Telephone Encounter (Signed)
Spoke with patient and advised her to take her last Repatha 140mg  injection today. She will be due to start her Repatha 420mg  infusion on Jan 4. Walked patient through how to use the self infusion.

## 2019-09-07 NOTE — Telephone Encounter (Signed)
Patient calling stating she does not know how to use her new Repatha. She would like a call back.

## 2019-09-09 ENCOUNTER — Telehealth: Payer: Self-pay | Admitting: Interventional Cardiology

## 2019-09-09 NOTE — Telephone Encounter (Signed)
Pt called in inquiring about this 2 days ago, teaching instructions were provided via telephone. Returned call to pt, she wishes to come to clinic so we can show her how to use her first infusion. Scheduled appt 1/4 at 8:30am with pharmacist so we can assist with Repatha infusion.

## 2019-09-09 NOTE — Telephone Encounter (Signed)
Pt c/o medication issue:  1. Name of Medication: Evolocumab with Infusor (Biloxi) 420 MG/3.5ML SOCT  2. How are you currently taking this medication (dosage and times per day)? Not currently taking the injection. She states she is due for it 09/21/19  3. Are you having a reaction (difficulty breathing--STAT)? no  4. What is your medication issue? Patient would like a call back from a nurse to show her how to use the medication.

## 2019-09-21 ENCOUNTER — Ambulatory Visit (INDEPENDENT_AMBULATORY_CARE_PROVIDER_SITE_OTHER): Payer: Medicare Other | Admitting: Pharmacist

## 2019-09-21 ENCOUNTER — Other Ambulatory Visit: Payer: Self-pay

## 2019-09-21 ENCOUNTER — Encounter (INDEPENDENT_AMBULATORY_CARE_PROVIDER_SITE_OTHER): Payer: Self-pay

## 2019-09-21 DIAGNOSIS — E7800 Pure hypercholesterolemia, unspecified: Secondary | ICD-10-CM

## 2019-09-21 NOTE — Patient Instructions (Signed)
Continue with your once monthly Repatha infusions - your next injection is due February 4th  Have your primary care doctor check your fasting cholesterol in the next few months  Call clinic with any issues with your Repatha #[phone]

## 2019-09-21 NOTE — Progress Notes (Signed)
Pt presented today for Repatha 420mg  on body auto-infusor training. Pt successfully self-administered Repatha infusion over 10 minutes with no apparent side effects. She will have follow up labs checked with her PCP in a few months and was provided with direct # to clinic in case of questions.

## 2019-10-13 ENCOUNTER — Telehealth: Payer: Self-pay | Admitting: Gastroenterology

## 2019-10-13 NOTE — Telephone Encounter (Signed)
That's fine, we can add it on to be drawn

## 2019-10-13 NOTE — Addendum Note (Signed)
Addended by: Lavance Beazer E on: 10/13/2019 03:08 PM   Modules accepted: Orders

## 2019-10-13 NOTE — Telephone Encounter (Signed)
Pt is requesting labs to check her B12 levels because she would like switch to pills instead of B12 injections. She states that she will be having labs ordered by another MD on 2/8 and they are willing to check for her B12 if Dr. Adela Lank is ok with that.

## 2019-10-14 NOTE — Telephone Encounter (Signed)
Called patient and let her know Dr. Adela Lank is ok with her adding a B-12 level to the labs her other Dr. Is drawing.

## 2019-10-15 ENCOUNTER — Ambulatory Visit: Payer: Medicare Other | Admitting: Family Medicine

## 2019-10-23 ENCOUNTER — Other Ambulatory Visit: Payer: Self-pay

## 2019-10-23 ENCOUNTER — Telehealth: Payer: Self-pay

## 2019-10-23 ENCOUNTER — Ambulatory Visit (INDEPENDENT_AMBULATORY_CARE_PROVIDER_SITE_OTHER): Payer: Medicare Other

## 2019-10-23 DIAGNOSIS — E538 Deficiency of other specified B group vitamins: Secondary | ICD-10-CM | POA: Diagnosis not present

## 2019-10-23 NOTE — Telephone Encounter (Signed)
Patient calls nurse line stating she has prescription for B12 injections from Dr. Adela Lank, has been validated in epic. Inject 1,087mcg Im every 30 days. Patient stated her home health nurse was giving her her injections, however she is no longer coming to her home as of yesterday. Patient last injection was 09/21/2019, and due for February injection now. Per PCP, ok to give her 1,020mcg IM B12 injection. Patient coming in today for nurse visit.

## 2019-10-23 NOTE — Progress Notes (Signed)
Pt given 1000 mcg B12 injection in Right Deltoid. Pt tolerated well. Site unremarkable.   NDC: 99357-017-79 Lot [id number] 07/2022  Veronda Prude, RN

## 2019-10-26 ENCOUNTER — Other Ambulatory Visit: Payer: Self-pay

## 2019-10-26 ENCOUNTER — Other Ambulatory Visit: Payer: Medicare Other | Admitting: *Deleted

## 2019-10-26 DIAGNOSIS — E7800 Pure hypercholesterolemia, unspecified: Secondary | ICD-10-CM

## 2019-10-26 LAB — LIPID PANEL
Chol/HDL Ratio: 2.8 ratio (ref 0.0–4.4)
Cholesterol, Total: 163 mg/dL (ref 100–199)
HDL: 59 mg/dL (ref >39)
LDL Chol Calc (NIH): 93 mg/dL (ref 0–99)
Triglycerides: 53 mg/dL (ref 0–149)
VLDL Cholesterol Cal: 11 mg/dL (ref 5–40)

## 2019-10-26 LAB — HEPATIC FUNCTION PANEL
ALT: 6 IU/L (ref 0–32)
AST: 13 IU/L (ref 0–40)
Albumin: 3.7 g/dL (ref 3.7–4.7)
Alkaline Phosphatase: 97 IU/L (ref 39–117)
Bilirubin Total: 0.3 mg/dL (ref 0.0–1.2)
Bilirubin, Direct: 0.1 mg/dL (ref 0.00–0.40)
Total Protein: 6.5 g/dL (ref 6.0–8.5)

## 2019-10-28 ENCOUNTER — Other Ambulatory Visit: Payer: Self-pay

## 2019-10-28 ENCOUNTER — Telehealth: Payer: Self-pay | Admitting: Gastroenterology

## 2019-10-28 DIAGNOSIS — E538 Deficiency of other specified B group vitamins: Secondary | ICD-10-CM

## 2019-10-28 NOTE — Telephone Encounter (Signed)
Patient's lab work from her cardiologist did not include a B12 level. She wants to know if we can order one, to see if she can transition from the injections to PO.

## 2019-10-28 NOTE — Telephone Encounter (Signed)
Order in Epic for B12  Level. Called patient and let her know she can come into our lab between 7:30am-4:30pm (except for 1:00pm-1:30pm) Mon-Fri. And get that drawn

## 2019-10-28 NOTE — Telephone Encounter (Signed)
Yes we can check the B12 level for her if she would like. Thanks

## 2019-10-28 NOTE — Telephone Encounter (Signed)
Pt requested a call back to discuss B12 shot.

## 2019-11-03 ENCOUNTER — Other Ambulatory Visit (INDEPENDENT_AMBULATORY_CARE_PROVIDER_SITE_OTHER): Payer: Medicare Other

## 2019-11-03 ENCOUNTER — Telehealth: Payer: Self-pay

## 2019-11-03 DIAGNOSIS — E538 Deficiency of other specified B group vitamins: Secondary | ICD-10-CM

## 2019-11-03 LAB — VITAMIN B12: Vitamin B-12: 676 pg/mL (ref 211–911)

## 2019-11-03 NOTE — Telephone Encounter (Signed)
Pt calls nurse line regarding questions for receiving the COVID vaccine. Patient is concerned due to current medication allergies. Went over COVID vaccine algorithm with patient. Patient has never had a severe allergic reaction to any immunization and is indicated that she should receive vaccine per algorithm. Patient informed to contact office if she has reaction to first vaccine.   To PCP  Veronda Prude, RN

## 2019-11-09 ENCOUNTER — Telehealth: Payer: Self-pay | Admitting: Gastroenterology

## 2019-11-09 DIAGNOSIS — E538 Deficiency of other specified B group vitamins: Secondary | ICD-10-CM

## 2019-11-09 NOTE — Telephone Encounter (Signed)
Okay to switch to oral B12  1000 mcg orally once per day. We can give her #90 RF1. I'd like to repeat her B12 level in about 3-4 months if she wishes to take it orally to make sure it's stable. Thanks

## 2019-11-09 NOTE — Telephone Encounter (Signed)
Pt calling regarding her B12 lab results. Let pt know Dr. Adela Lank states her level is normal and to continue her injections. Pt states she does not want to take the shots anymore, no one available to give them to her. Would like to take oral B12 but wants Dr. Adela Lank to let her know how much to take. Please advise.

## 2019-11-10 MED ORDER — VITAMIN B-12 1000 MCG SL SUBL: 1000.0000 ug | SUBLINGUAL_TABLET | Freq: Every day | SUBLINGUAL | 1 refills | Status: DC

## 2019-11-10 NOTE — Telephone Encounter (Signed)
Spoke with pt and she is aware. Orders and reminder in epic, script sent to pharmacy.

## 2019-12-23 ENCOUNTER — Telehealth: Payer: Self-pay | Admitting: Interventional Cardiology

## 2019-12-23 NOTE — Telephone Encounter (Signed)
New message  Pt c/o medication issue:  1. Name of Medication:   Evolocumab with Infusor (REPATHA PUSHTRONEX SYSTEM) 420 MG/3.5ML SOCT     2. How are you currently taking this medication (dosage and times per day)? As written  3. Are you having a reaction (difficulty breathing--STAT)?no   4. What is your medication issue? Patient states that she missed her date for the injection and wants to discuss when to take the injection now. Please call.

## 2019-12-23 NOTE — Telephone Encounter (Signed)
Returned call to pt. She states she was due for her Repatha infusion on 4/4. Advised pt she can give her infusion now and go back to every 28 day cycle next month. She verbalized understanding.

## 2020-01-06 ENCOUNTER — Ambulatory Visit (INDEPENDENT_AMBULATORY_CARE_PROVIDER_SITE_OTHER): Payer: Medicare Other | Admitting: Family Medicine

## 2020-01-06 ENCOUNTER — Other Ambulatory Visit: Payer: Self-pay

## 2020-01-06 DIAGNOSIS — R519 Headache, unspecified: Secondary | ICD-10-CM | POA: Insufficient documentation

## 2020-01-06 DIAGNOSIS — G4489 Other headache syndrome: Secondary | ICD-10-CM | POA: Diagnosis not present

## 2020-01-06 LAB — POCT SEDIMENTATION RATE: POCT SED RATE: 51 mm/hr — AB (ref 0–22)

## 2020-01-06 NOTE — Assessment & Plan Note (Addendum)
Patient with new onset headaches which seem to be different from her previous.  Given age and significant family history for hemorrhaging will need to rule this out with imaging.  Will obtain CT head stat to rule this out.  We will schedule for patient prior to leaving clinic. Patient with previous MRA head showing no signs of aneurysms.  Given that symptoms have resolved and normal neuro exam this is also reassuring.  Given concern of headache with jaw pain will need to rule out temporal arteritis.  Will get point-of-care sed rate.  If above safe we will plan to treat as temporal arteritis.  ED precautions discussed with patient.  Strict return precautions given.  Advised to follow-up in 1 to 2 weeks.

## 2020-01-06 NOTE — Patient Instructions (Addendum)
It was a pleasure seeing you today.   Today we discussed your headaches   For your headaches:  1.  I have ordered a head CT.  This will be scheduled for you prior to leaving clinic.  2.  If you have worsening headache, vision changes, dizziness, loss of consciousness go to the emergency room 3.  If you have weakness facial droop go to the emergency room 4.  I have also obtain blood work and will let you know the answers of the results via telephone  Please follow up in 1-2 weeks or sooner if symptoms persist or worsen. Please call the clinic immediately if you have any concerns.   Our clinic's number is [phone]. Please call with questions or concerns.   Thank you,  Oralia Manis, DO

## 2020-01-06 NOTE — Progress Notes (Signed)
    SUBJECTIVE:   CHIEF COMPLAINT / HPI:   Headaches Patient presenting for new onset headaches.  States that she began to have these headaches 2 nights ago.  Has had headaches in the past but this feels nothing like them.  Headache was constant.  It was pulsatile and unilateral on the right side.  States that she also had neck pain at that time and neck stiffness almost like she could not turn her head.  Was only occurring on the right side.  Radiated to her shoulder as well.  Did endorse and nausea x1 day.  No vision changes.  No aura.  States that the headache felt like her head was going to blow off.  Did report some weakness.  Denies any dizziness or loss of consciousness.  She states that she was very concerned because she had several family members had a history of brain hemorrhaging.  Her grandmother and aunt had brain hemorrhages.  Brother also did an eye type surgery.  Sister is currently being monitored for brain hemorrhage.  Patient reports that headaches completely resolved today and she has not had no further headaches.  Jaw pain  Along with headaches patient is also having left-sided jaw pain.  Occurs at the same time as the headaches.  Does report some left arm pain when she has a jaw pain.  Had jaw pain was constant as well.  Did take Tylenol which helped.  Denies any trauma.  Jaw pain is also resolved now.  PERTINENT  PMH / PSH: headaches, HTN, arthritis, tremor   OBJECTIVE:   BP (!) 118/58   Pulse 76   Ht 5\' 2"  (1.575 m)   Wt 156 lb 3.2 oz (70.9 kg)   SpO2 97%   BMI 28.57 kg/m   Gen: awake and alert, NAD  HEENT: moist mucous membranes, PERRL, EOMI, 3 beat horizontal nystagmus, no AV nicking, no papilledema, uvula midline, no jaw tenderness  Neuro: no focal deficits, 5/5 muscle and grip strength, no finger to nose dysmetria, resting tremor, CN2-12 intact  ASSESSMENT/PLAN:   Headache Patient with new onset headaches which seem to be different from her previous.  Given  age and significant family history for hemorrhaging will need to rule this out with imaging.  Will obtain CT head stat to rule this out.  We will schedule for patient prior to leaving clinic. Patient with previous MRA head showing no signs of aneurysms.  Given that symptoms have resolved and normal neuro exam this is also reassuring.  Given concern of headache with jaw pain will need to rule out temporal arteritis.  Will get point-of-care sed rate.  If above safe we will plan to treat as temporal arteritis.  ED precautions discussed with patient.  Strict return precautions given.  Advised to follow-up in 1 to 2 weeks.     , DO [facility]

## 2020-01-07 ENCOUNTER — Other Ambulatory Visit: Payer: Self-pay | Admitting: Family Medicine

## 2020-01-07 ENCOUNTER — Encounter (HOSPITAL_COMMUNITY): Payer: Self-pay

## 2020-01-07 ENCOUNTER — Ambulatory Visit (HOSPITAL_COMMUNITY)
Admission: RE | Admit: 2020-01-07 | Discharge: 2020-01-07 | Disposition: A | Discharge status: Home / Self Care | Payer: Medicare Other | Source: Ambulatory Visit | Attending: Family Medicine | Admitting: Family Medicine

## 2020-01-07 ENCOUNTER — Telehealth: Payer: Self-pay | Admitting: *Deleted

## 2020-01-07 DIAGNOSIS — R519 Headache, unspecified: Secondary | ICD-10-CM | POA: Diagnosis not present

## 2020-01-07 DIAGNOSIS — G4489 Other headache syndrome: Secondary | ICD-10-CM | POA: Diagnosis not present

## 2020-01-07 NOTE — Telephone Encounter (Signed)
-----   Message from Oralia Manis, DO sent at 01/06/2020  3:50 PM EDT ----- Attempted to call but no response. Please inform patient that results of sed rate show elevation but not so high where she would need any treatment. This is a marker for inflammation. We do not think she has temporal arteritis which we discussed as a possible cause for her headaches. We will await the results of her CT for further recommendations. I have discussed this with both Dr. Manson Passey and Dr. Deirdre Priest

## 2020-01-07 NOTE — Telephone Encounter (Signed)
Pt informed. Dayton Sherr, CMA  

## 2020-01-07 NOTE — Telephone Encounter (Signed)
Pt informed. Linus Weckerly, CMA  

## 2020-01-07 NOTE — Telephone Encounter (Signed)
-----   Message from Oralia Manis, DO sent at 01/07/2020  3:04 PM EDT ----- Please inform patient that results of head CT are negative. If her headaches return she should follow up in clinic.

## 2020-02-16 ENCOUNTER — Encounter (HOSPITAL_COMMUNITY): Payer: Self-pay | Admitting: Emergency Medicine

## 2020-02-16 ENCOUNTER — Emergency Department (HOSPITAL_COMMUNITY): Payer: Medicare Other

## 2020-02-16 ENCOUNTER — Other Ambulatory Visit: Payer: Self-pay

## 2020-02-16 ENCOUNTER — Emergency Department (HOSPITAL_COMMUNITY)
Admission: EM | Admit: 2020-02-16 | Discharge: 2020-02-17 | Disposition: A | Discharge status: Home / Self Care | Payer: Medicare Other | Attending: Emergency Medicine | Admitting: Emergency Medicine

## 2020-02-16 DIAGNOSIS — R002 Palpitations: Secondary | ICD-10-CM | POA: Insufficient documentation

## 2020-02-16 DIAGNOSIS — Z79899 Other long term (current) drug therapy: Secondary | ICD-10-CM | POA: Diagnosis not present

## 2020-02-16 DIAGNOSIS — I2510 Atherosclerotic heart disease of native coronary artery without angina pectoris: Secondary | ICD-10-CM | POA: Insufficient documentation

## 2020-02-16 LAB — CBC
HCT: 31.3 % — ABNORMAL LOW (ref 36.0–46.0)
Hemoglobin: 10.3 g/dL — ABNORMAL LOW (ref 12.0–15.0)
MCH: 30.6 pg (ref 26.0–34.0)
MCHC: 32.9 g/dL (ref 30.0–36.0)
MCV: 92.9 fL (ref 80.0–100.0)
Platelets: 333 10*3/uL (ref 150–400)
RBC: 3.37 MIL/uL — ABNORMAL LOW (ref 3.87–5.11)
RDW: 12.9 % (ref 11.5–15.5)
WBC: 5.3 10*3/uL (ref 4.0–10.5)
nRBC: 0 % (ref 0.0–0.2)

## 2020-02-16 LAB — BASIC METABOLIC PANEL
Anion gap: 10 (ref 5–15)
BUN: 13 mg/dL (ref 8–23)
CO2: 20 mmol/L — ABNORMAL LOW (ref 22–32)
Calcium: 9.2 mg/dL (ref 8.9–10.3)
Chloride: 107 mmol/L (ref 98–111)
Creatinine, Ser: 1.17 mg/dL — ABNORMAL HIGH (ref 0.44–1.00)
GFR calc Af Amer: 53 mL/min — ABNORMAL LOW (ref >60)
GFR calc non Af Amer: 46 mL/min — ABNORMAL LOW (ref >60)
Glucose, Bld: 99 mg/dL (ref 70–99)
Potassium: 3.9 mmol/L (ref 3.5–5.1)
Sodium: 137 mmol/L (ref 135–145)

## 2020-02-16 LAB — TROPONIN I (HIGH SENSITIVITY): Troponin I (High Sensitivity): <2 ng/L (ref <18)

## 2020-02-16 MED ORDER — SODIUM CHLORIDE 0.9% FLUSH: 3.0000 mL | Freq: Once | INTRAVENOUS | Status: DC

## 2020-02-16 NOTE — ED Triage Notes (Signed)
Patient in POV. Patient reports palpitations X several days, worse today. Also endorses weakness. Denies CP/SOB. Patient states she has history of frequent PVC's.

## 2020-02-17 LAB — MAGNESIUM: Magnesium: 1.8 mg/dL (ref 1.7–2.4)

## 2020-02-17 LAB — T4, FREE: Free T4: 0.67 ng/dL (ref 0.61–1.12)

## 2020-02-17 LAB — TROPONIN I (HIGH SENSITIVITY): Troponin I (High Sensitivity): <2 ng/L (ref <18)

## 2020-02-17 LAB — TSH: TSH: 0.655 u[IU]/mL (ref 0.350–4.500)

## 2020-02-17 NOTE — Discharge Instructions (Addendum)
Your labs, EKG, chest x-ray were reassuring today.  I recommend follow-up with your cardiologist if symptoms continue.
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
## 2020-02-17 NOTE — ED Notes (Signed)
ED Provider at bedside. 

## 2020-03-03 ENCOUNTER — Other Ambulatory Visit (INDEPENDENT_AMBULATORY_CARE_PROVIDER_SITE_OTHER): Payer: Medicare Other

## 2020-03-03 DIAGNOSIS — E538 Deficiency of other specified B group vitamins: Secondary | ICD-10-CM

## 2020-03-03 LAB — VITAMIN B12: Vitamin B-12: >1526 pg/mL — ABNORMAL HIGH (ref 211–911)

## 2020-03-08 ENCOUNTER — Other Ambulatory Visit: Payer: Self-pay

## 2020-03-08 DIAGNOSIS — K317 Polyp of stomach and duodenum: Secondary | ICD-10-CM
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
## 2020-03-15 ENCOUNTER — Encounter: Payer: Self-pay | Admitting: Nurse Practitioner

## 2020-03-15 ENCOUNTER — Other Ambulatory Visit: Payer: Self-pay

## 2020-03-15 ENCOUNTER — Ambulatory Visit: Payer: Medicare Other | Admitting: Nurse Practitioner

## 2020-03-15 VITALS — BP 120/66 | HR 64 | Ht 63.0 in | Wt 158.8 lb

## 2020-03-15 DIAGNOSIS — I259 Chronic ischemic heart disease, unspecified: Secondary | ICD-10-CM | POA: Diagnosis not present

## 2020-03-15 DIAGNOSIS — I493 Ventricular premature depolarization: Secondary | ICD-10-CM

## 2020-03-15 NOTE — Patient Instructions (Addendum)
After Visit Summary:  We will be checking the following labs today - NONE   Medication Instructions:    Continue with your current medicines.    If you need a refill on your cardiac medications before your next appointment, please call your pharmacy.     Testing/Procedures To Be Arranged:  Lexiscan Myoview  You are scheduled for a Myocardial Perfusion Imaging Study on __________________________ at _________________________________.   Please arrive 15 minutes prior to your appointment time for registration and insurance purposes.   The test will take approximately 3 to 4 hours to complete; you may bring reading material. If someone comes with you to your appointment, they will need to remain in the main lobby due to limited space in the testing area.    How to prepare for your Myocardial Perfusion test:   Do not eat or drink 3 hours prior to your test, except you may have water.    Do not consume products containing caffeine (regular or decaffeinated) 12 hours prior to your test (ex: coffee, chocolate, soda, tea)   Do bring a list of your current medications with you. If not listed below, you may take your medications as normal.    Bring any held medication to your appointment, as you may be required to take it once the test is complete.   Do wear comfortable clothes (no dresses or overalls) and walking shoes. Tennis shoes are preferred. No heels or open toed shoes.  Do not wear cologne, perfume, aftershave or lotions (deodorant is allowed).   If these instructions are not followed, you test will have to be rescheduled.   Please report to [address] for your test. If you have questions or concerns about your appointment, please call the Nuclear Lab at #[phone].  If you cannot keep your appointment, please provide 24 hour notification to the Nuclear lab to avoid a possible $50 charge to your account.       Follow-Up:   See Dr. Katrinka Blazing as  planned in September    At [facility], you and your health needs are our priority.  As part of our continuing mission to provide you with exceptional heart care, we have created designated Provider Care Teams.  These Care Teams include your primary Cardiologist (physician) and Advanced Practice Providers (APPs -  Physician Assistants and Nurse Practitioners) who all work together to provide you with the care you need, when you need it.  Special Instructions:  . Stay safe, wash your hands for at least 20 seconds and wear a mask when needed.  . It was good to talk with you today.    Call the [facility] office at [phone] if you have any questions, problems or concerns.

## 2020-03-18 ENCOUNTER — Telehealth (HOSPITAL_COMMUNITY): Payer: Self-pay | Admitting: *Deleted

## 2020-03-18 NOTE — Telephone Encounter (Signed)
Patient given detailed instructions per Myocardial Perfusion Study Information Sheet for the test on 03/22/20 at 8:15. Patient notified to arrive 15 minutes early and that it is imperative to arrive on time for appointment to keep from having the test rescheduled.  If you need to cancel or reschedule your appointment, please call the office within 24 hours of your appointment. . Patient verbalized understanding.Daneil Dolin

## 2020-03-22 ENCOUNTER — Other Ambulatory Visit: Payer: Self-pay

## 2020-03-22 ENCOUNTER — Ambulatory Visit (HOSPITAL_COMMUNITY): Payer: Medicare Other | Attending: Cardiology

## 2020-03-22 DIAGNOSIS — I259 Chronic ischemic heart disease, unspecified: Secondary | ICD-10-CM | POA: Insufficient documentation

## 2020-03-22 LAB — MYOCARDIAL PERFUSION IMAGING
LV dias vol: 51 mL (ref 46–106)
LV sys vol: 12 mL
Peak HR: 104 {beats}/min
Rest HR: 65 {beats}/min
SDS: 2
SRS: 0
SSS: 2
TID: 0.92

## 2020-03-22 MED ORDER — TECHNETIUM TC 99M TETROFOSMIN IV KIT
10.1000 | PACK | Freq: Once | INTRAVENOUS | Status: AC | PRN
  Administered 2020-03-22: 10.1 via INTRAVENOUS
  Filled 2020-03-22: qty 11

## 2020-03-22 MED ORDER — TECHNETIUM TC 99M TETROFOSMIN IV KIT
31.8000 | PACK | Freq: Once | INTRAVENOUS | Status: AC | PRN
  Administered 2020-03-22: 31.8 via INTRAVENOUS
  Filled 2020-03-22: qty 32

## 2020-03-22 MED ORDER — REGADENOSON 0.4 MG/5ML IV SOLN
0.4000 mg | Freq: Once | INTRAVENOUS | Status: AC
Start: 2020-03-22 — End: 2020-03-22
  Administered 2020-03-22: 0.4 mg via INTRAVENOUS

## 2020-03-31 ENCOUNTER — Telehealth: Payer: Self-pay | Admitting: Interventional Cardiology

## 2020-03-31 DIAGNOSIS — E876 Hypokalemia: Secondary | ICD-10-CM

## 2020-03-31 MED ORDER — POTASSIUM CHLORIDE CRYS ER 20 MEQ PO TBCR: EXTENDED_RELEASE_TABLET | ORAL | 3 refills | Status: DC

## 2020-03-31 NOTE — Telephone Encounter (Signed)
New message      *STAT* If patient is at the pharmacy, call can be transferred to refill team.  1. Which medications need to be refilled? (please list name of each medication and dose if known) clor-con m20  2. Which pharmacy/location (including street and city if local pharmacy) is medication to be sent to? [facility] on [address] in [place]  3. Do they need a 30 day or 90 day supply? 90 day

## 2020-03-31 NOTE — Telephone Encounter (Signed)
Pt's medication was sent to pt's pharmacy as requested. Confirmation received.  °

## 2020-04-14 ENCOUNTER — Other Ambulatory Visit (HOSPITAL_COMMUNITY)
Admission: RE | Admit: 2020-04-14 | Discharge: 2020-04-14 | Disposition: A | Discharge status: Home / Self Care | Payer: Medicare Other | Source: Ambulatory Visit | Attending: Gastroenterology | Admitting: Gastroenterology

## 2020-04-14 DIAGNOSIS — Z20822 Contact with and (suspected) exposure to covid-19: Secondary | ICD-10-CM | POA: Diagnosis not present

## 2020-04-14 DIAGNOSIS — Z01812 Encounter for preprocedural laboratory examination: Secondary | ICD-10-CM | POA: Diagnosis not present

## 2020-04-14 LAB — SARS CORONAVIRUS 2 (TAT 6-24 HRS): SARS Coronavirus 2: NEGATIVE

## 2020-04-15 NOTE — Progress Notes (Signed)
Attempted pre-op call, no answer and voicemail was full.

## 2020-04-15 NOTE — Progress Notes (Signed)
Attempted pre op call for endo procedure 04/18/20, no answer, auto VM- no message left.

## 2020-04-18 ENCOUNTER — Encounter (HOSPITAL_COMMUNITY)
Admission: RE | Disposition: A | Discharge status: Home / Self Care | Payer: Self-pay | Source: Home / Self Care | Attending: Gastroenterology

## 2020-04-18 ENCOUNTER — Ambulatory Visit (HOSPITAL_COMMUNITY)
Admission: RE | Admit: 2020-04-18 | Discharge: 2020-04-18 | Disposition: A | Discharge status: Home / Self Care | Payer: Medicare Other | Attending: Gastroenterology | Admitting: Gastroenterology

## 2020-04-18 ENCOUNTER — Other Ambulatory Visit: Payer: Self-pay

## 2020-04-18 ENCOUNTER — Ambulatory Visit (HOSPITAL_COMMUNITY): Payer: Medicare Other | Admitting: Anesthesiology

## 2020-04-18 ENCOUNTER — Encounter (HOSPITAL_COMMUNITY): Payer: Self-pay | Admitting: Gastroenterology

## 2020-04-18 DIAGNOSIS — K317 Polyp of stomach and duodenum: Secondary | ICD-10-CM | POA: Diagnosis not present

## 2020-04-18 DIAGNOSIS — E669 Obesity, unspecified: Secondary | ICD-10-CM | POA: Diagnosis not present

## 2020-04-18 DIAGNOSIS — D571 Sickle-cell disease without crisis: Secondary | ICD-10-CM | POA: Insufficient documentation

## 2020-04-18 DIAGNOSIS — Z6827 Body mass index (BMI) 27.0-27.9, adult: Secondary | ICD-10-CM | POA: Diagnosis not present

## 2020-04-18 DIAGNOSIS — K3189 Other diseases of stomach and duodenum: Secondary | ICD-10-CM | POA: Diagnosis not present

## 2020-04-18 DIAGNOSIS — I10 Essential (primary) hypertension: Secondary | ICD-10-CM | POA: Diagnosis not present

## 2020-04-18 DIAGNOSIS — E7800 Pure hypercholesterolemia, unspecified: Secondary | ICD-10-CM | POA: Diagnosis not present

## 2020-04-18 DIAGNOSIS — K449 Diaphragmatic hernia without obstruction or gangrene: Secondary | ICD-10-CM | POA: Insufficient documentation

## 2020-04-18 HISTORY — PX: SUBMUCOSAL LIFTING INJECTION: SHX6855

## 2020-04-18 HISTORY — PX: HEMOSTASIS CLIP PLACEMENT: SHX6857

## 2020-04-18 HISTORY — PX: ENDOSCOPIC MUCOSAL RESECTION: SHX6839

## 2020-04-18 HISTORY — PX: ESOPHAGOGASTRODUODENOSCOPY (EGD) WITH PROPOFOL: SHX5813

## 2020-04-18 SURGERY — ESOPHAGOGASTRODUODENOSCOPY (EGD) WITH PROPOFOL
Anesthesia: Monitor Anesthesia Care

## 2020-04-18 MED ORDER — LIDOCAINE 2% (20 MG/ML) 5 ML SYRINGE
INTRAMUSCULAR | Status: DC | PRN
  Administered 2020-04-18: 40 mg via INTRAVENOUS

## 2020-04-18 MED ORDER — OMEPRAZOLE 40 MG PO CPDR: 40.0000 mg | DELAYED_RELEASE_CAPSULE | Freq: Two times a day (BID) | ORAL | 2 refills | Status: DC

## 2020-04-18 MED ORDER — LACTATED RINGERS IV SOLN
INTRAVENOUS | Status: AC | PRN
  Administered 2020-04-18: 1000 mL via INTRAVENOUS

## 2020-04-18 MED ORDER — PHENYLEPHRINE 40 MCG/ML (10ML) SYRINGE FOR IV PUSH (FOR BLOOD PRESSURE SUPPORT)
PREFILLED_SYRINGE | INTRAVENOUS | Status: DC | PRN
  Administered 2020-04-18 (×2): 80 ug via INTRAVENOUS
  Administered 2020-04-18: 120 ug via INTRAVENOUS
  Administered 2020-04-18 (×2): 80 ug via INTRAVENOUS

## 2020-04-18 MED ORDER — PROPOFOL 500 MG/50ML IV EMUL
INTRAVENOUS | Status: DC | PRN
  Administered 2020-04-18: 140 ug/kg/min via INTRAVENOUS

## 2020-04-18 MED ORDER — SODIUM CHLORIDE 0.9 % IV SOLN: INTRAVENOUS | Status: DC

## 2020-04-18 MED ORDER — PHENYLEPHRINE HCL-NACL 10-0.9 MG/250ML-% IV SOLN
INTRAVENOUS | Status: DC | PRN
Start: 2020-04-18 — End: 2020-04-18
  Administered 2020-04-18: 60 ug/min via INTRAVENOUS

## 2020-04-18 MED ORDER — PROPOFOL 10 MG/ML IV BOLUS
INTRAVENOUS | Status: DC | PRN
  Administered 2020-04-18 (×2): 20 mg via INTRAVENOUS

## 2020-04-18 SURGICAL SUPPLY — 15 items

## 2020-04-18 NOTE — Op Note (Signed)
[facility] Patient Name: [name] Procedure Date : 04/18/2020 MRN: [id number] Attending MD: Justice Britain , MD Date of Birth: 1945-11-15 CSN: [id number] Age: 74 Admit Type: Outpatient Procedure:                Upper GI endoscopy Indications:              Gastric polyps, For therapy of gastric polyps Providers:                Justice Britain, MD, Vista Lawman, RN, Lazaro Arms, Technician, Tyrone Apple, Technician,                            Pearline Cables, CRNA Referring MD:             Carlota Raspberry. Havery Moros, MD, Blane Ohara McDiarmid MD, MD Medicines:                Monitored Anesthesia Care Complications:            No immediate complications. Estimated Blood Loss:     Estimated blood loss was minimal. Procedure:                Pre-Anesthesia Assessment:                           - Prior to the procedure, a History and Physical                            was performed, and patient medications and                            allergies were reviewed. The patient's tolerance of                            previous anesthesia was also reviewed. The risks                            and benefits of the procedure and the sedation                            options and risks were discussed with the patient.                            All questions were answered, and informed consent                            was obtained. Prior Anticoagulants: The patient has                            taken no previous anticoagulant or antiplatelet                            agents except for aspirin. ASA Grade Assessment:  III - A patient with severe systemic disease. After                            reviewing the risks and benefits, the patient was                            deemed in satisfactory condition to undergo the                            procedure.                           After obtaining informed consent, the endoscope  was                            passed under direct vision. Throughout the                            procedure, the patient's blood pressure, pulse, and                            oxygen saturations were monitored continuously. The                            [id number]-H190 ([id number]) Olympus gastroscope was                            introduced through the mouth, and advanced to the                            second part of duodenum. The upper GI endoscopy was                            accomplished without difficulty. The patient                            tolerated the procedure. Scope In: Scope Out: Findings:      No gross lesions were noted in the entire esophagus.      The Z-line was regular and was found 35 cm from the incisors.      A 2 cm hiatal hernia was present.      A single 24 mm sessile polyp with no bleeding and no stigmata of recent       bleeding was found in the gastric fundus and in the gastric body.       Preparations were made for mucosal resection. White light and NBI was       performed was done to mark the borders of the lesion. Orise gel was       injected to raise the lesion. Piecemeal mucosal resection using a snare       was performed. Resection and retrieval were complete. To prevent       bleeding after mucosal resection, seven hemostatic clips were       successfully placed (MR conditional). There was no bleeding at the end       of the procedure.  No other gross lesions were noted in the entire examined stomach.      No gross lesions were noted in the duodenal bulb, in the first portion       of the duodenum and in the second portion of the duodenum. Impression:               - No gross lesions in esophagus.                           - Z-line regular, 35 cm from the incisors.                           - 2 cm hiatal hernia.                           - A single gastric polyp. Resected via piecemeal                            mucosal resection and retrieved.  Clips (MR                            conditional) were placed.                           - No other gross lesions in the stomach.                           - No gross lesions in the duodenal bulb, in the                            first portion of the duodenum and in the second                            portion of the duodenum. Recommendation:           - The patient will be observed post-procedure,                            until all discharge criteria are met.                           - Discharge patient to home.                           - Patient has a contact number available for                            emergencies. The signs and symptoms of potential                            delayed complications were discussed with the                            patient. Return to normal activities tomorrow.  Written discharge instructions were provided to the                            patient.                           - Full liquid diet today. Soft diet tomorrow for                            next 48 hours. Then may advance to normal diet                            thereafter.                           - Start Omeprazole 40 mg twice daily for 1 month                            (August). Then decrease to 40 mg once daily for 1                            month (September). Then decrease to 20 mg once                            daily for 1 month (October). Then may stop if not                            needed.                           - Monitor for signs/symptoms of bleeding,                            perforation, and infection. If issues please call                            our number to get further assistance as needed.                           - No aspirin, ibuprofen, naproxen, or other                            non-steroidal anti-inflammatory drugs for 2 weeks                            after polyp removal.                           - Await pathology  results.                           - Repeat upper endoscopy in 6 months for                            surveillance based on pathology  results if any                            evidence of dysplastic findings. If no evidence                            then consider a 1-year follow up. However, the                            reality is that if there is no dysplasia currently,                            likelihood of issues developing in future will be                            low based on the type of polyp that it was.                           - The findings and recommendations were discussed                            with the patient.                           - The findings and recommendations were discussed                            with the patient's family. Procedure Code(s):        --- Professional ---                           [phone], Esophagogastroduodenoscopy, flexible,                            transoral; with endoscopic mucosal resection Diagnosis Code(s):        --- Professional ---                           K44.9, Diaphragmatic hernia without obstruction or                            gangrene                           K31.7, Polyp of stomach and duodenum CPT copyright 2019 [employer]. All rights reserved. The codes documented in this report are preliminary and upon coder review may  be revised to meet current compliance requirements. Justice Britain, MD 04/18/2020 1:04:30 PM Number of Addenda: 0

## 2020-04-18 NOTE — Anesthesia Preprocedure Evaluation (Signed)
Anesthesia Evaluation  Patient identified by MRN, date of birth, ID band Patient awake    Reviewed: Allergy & Precautions, NPO status , Patient's Chart, lab work & pertinent test results  Airway Mallampati: II  TM Distance: >3 FB     Dental  (+) Edentulous Upper   Pulmonary    breath sounds clear to auscultation       Cardiovascular  Rhythm:Regular Rate:Normal     Neuro/Psych    GI/Hepatic   Endo/Other    Renal/GU      Musculoskeletal   Abdominal   Peds  Hematology   Anesthesia Other Findings   Reproductive/Obstetrics                             Anesthesia Physical Anesthesia Plan  ASA: III  Anesthesia Plan: MAC   Post-op Pain Management:    Induction: Intravenous  PONV Risk Score and Plan: Ondansetron  Airway Management Planned: Natural Airway and Nasal Cannula  Additional Equipment:   Intra-op Plan:   Post-operative Plan:   Informed Consent: I have reviewed the patients History and Physical, chart, labs and discussed the procedure including the risks, benefits and alternatives for the proposed anesthesia with the patient or authorized representative who has indicated his/her understanding and acceptance.       Plan Discussed with: Anesthesiologist and CRNA  Anesthesia Plan Comments:         Anesthesia Quick Evaluation

## 2020-04-18 NOTE — Transfer of Care (Signed)
Immediate Anesthesia Transfer of Care Note  Patient: [name]  Procedure(s) Performed: ESOPHAGOGASTRODUODENOSCOPY (EGD) WITH PROPOFOL (N/A ) ENDOSCOPIC MUCOSAL RESECTION (N/A ) SUBMUCOSAL LIFTING INJECTION POLYPECTOMY HEMOSTASIS CLIP PLACEMENT  Patient Location: PACU and Endoscopy Unit  Anesthesia Type:MAC  Level of Consciousness: awake, alert  and patient cooperative  Airway & Oxygen Therapy: Patient Spontanous Breathing  Post-op Assessment: Report given to RN and Post -op Vital signs reviewed and stable  Post vital signs: Reviewed and stable  Last Vitals:  Vitals Value Taken Time  BP 105/44 04/18/20 1248  Temp    Pulse 86 04/18/20 1249  Resp 21 04/18/20 1249  SpO2 100 % 04/18/20 1249  Vitals shown include unvalidated device data.  Last Pain:  Vitals:   04/18/20 1040  TempSrc: Oral  PainSc: 0-No pain         Complications: No complications documented.

## 2020-04-18 NOTE — Anesthesia Postprocedure Evaluation (Signed)
Anesthesia Post Note  Patient: DJUANA LITTLETON  Procedure(s) Performed: ESOPHAGOGASTRODUODENOSCOPY (EGD) WITH PROPOFOL (N/A ) ENDOSCOPIC MUCOSAL RESECTION (N/A ) SUBMUCOSAL LIFTING INJECTION POLYPECTOMY HEMOSTASIS CLIP PLACEMENT     Patient location during evaluation: PACU Anesthesia Type: MAC Level of consciousness: awake and alert Pain management: pain level controlled Vital Signs Assessment: post-procedure vital signs reviewed and stable Respiratory status: spontaneous breathing, nonlabored ventilation, respiratory function stable and patient connected to nasal cannula oxygen Cardiovascular status: blood pressure returned to baseline and stable Postop Assessment: no apparent nausea or vomiting Anesthetic complications: no   No complications documented.  Last Vitals:  Vitals:   04/18/20 1320 04/18/20 1330  BP: (!) 126/59 (!) 140/50  Pulse: 82 81  Resp: 17 12  Temp:    SpO2: 99% 99%    Last Pain:  Vitals:   04/18/20 1330  TempSrc:   PainSc: 0-No pain                 Qaadir Kent DAVID

## 2020-04-18 NOTE — Discharge Instructions (Signed)
YOU HAD AN ENDOSCOPIC PROCEDURE TODAY: Refer to the procedure report and other information in the discharge instructions given to you for any specific questions about what was found during the examination. If this information does not answer your questions, please call [place] office at [phone] to clarify.  ° °YOU SHOULD EXPECT: Some feelings of bloating in the abdomen. Passage of more gas than usual. Walking can help get rid of the air that was put into your GI tract during the procedure and reduce the bloating. If you had a lower endoscopy (such as a colonoscopy or flexible sigmoidoscopy) you may notice spotting of blood in your stool or on the toilet paper. Some abdominal soreness may be present for a day or two, also. ° °DIET: Your first meal following the procedure should be a light meal and then it is ok to progress to your normal diet. A half-sandwich or bowl of soup is an example of a good first meal. Heavy or fried foods are harder to digest and may make you feel nauseous or bloated. Drink plenty of fluids but you should avoid alcoholic beverages for 24 hours. If you had a esophageal dilation, please see attached instructions for diet.   ° °ACTIVITY: Your care partner should take you home directly after the procedure. You should plan to take it easy, moving slowly for the rest of the day. You can resume normal activity the day after the procedure however YOU SHOULD NOT DRIVE, use power tools, machinery or perform tasks that involve climbing or major physical exertion for 24 hours (because of the sedation medicines used during the test).  ° °SYMPTOMS TO REPORT IMMEDIATELY: °A gastroenterologist can be reached at any hour. Please call [phone]  for any of the following symptoms:  °Following lower endoscopy (colonoscopy, flexible sigmoidoscopy) °Excessive amounts of blood in the stool  °Significant tenderness, worsening of abdominal pains  °Swelling of the abdomen that is new, acute  °Fever of 100° or  higher  °Following upper endoscopy (EGD, EUS, ERCP, esophageal dilation) °Vomiting of blood or coffee ground material  °New, significant abdominal pain  °New, significant chest pain or pain under the shoulder blades  °Painful or persistently difficult swallowing  °New shortness of breath  °Black, tarry-looking or red, bloody stools ° °FOLLOW UP:  °If any biopsies were taken you will be contacted by phone or by letter within the next 1-3 weeks. Call [phone]  if you have not heard about the biopsies in 3 weeks.  °Please also call with any specific questions about appointments or follow up tests. ° °
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
## 2020-04-18 NOTE — Anesthesia Procedure Notes (Signed)
Procedure Name: MAC Date/Time: 04/18/2020 11:51 AM Performed by: Colin Benton, CRNA Pre-anesthesia Checklist: Patient identified, Emergency Drugs available, Patient being monitored and Suction available Patient Re-evaluated:Patient Re-evaluated prior to induction Oxygen Delivery Method: Nasal cannula Induction Type: IV induction Placement Confirmation: positive ETCO2 Dental Injury: Teeth and Oropharynx as per pre-operative assessment

## 2020-04-19 ENCOUNTER — Encounter (HOSPITAL_COMMUNITY): Payer: Self-pay | Admitting: Gastroenterology

## 2020-04-21 LAB — SURGICAL PATHOLOGY

## 2020-04-29 ENCOUNTER — Telehealth: Payer: Self-pay | Admitting: Interventional Cardiology

## 2020-04-29 NOTE — Telephone Encounter (Signed)
Pt c/o medication issue:  1. Name of Medication: Evolocumab with Infusor (REPATHA PUSHTRONEX SYSTEM) 420 MG/3.5ML SOCT  2. How are you currently taking this medication (dosage and times per day)? Inject 1 pen every 30 days  3. Are you having a reaction (difficulty breathing--STAT)? no  4. What is your medication issue? Patient states she has some questions on how she can pay for the medication.

## 2020-05-02 NOTE — Telephone Encounter (Signed)
Patient calling back.   °

## 2020-05-02 NOTE — Telephone Encounter (Signed)
Haleigh following up. Will call patient as soon as possible

## 2020-05-02 NOTE — Telephone Encounter (Signed)
Called and spoke w pt and the pt expressed that she was concerned that her surgery would cause the cost of her med repatha to go up. I instructed her that that was not the case as is is covered by the [employer] grant and she voiced understanding.

## 2020-05-11 ENCOUNTER — Other Ambulatory Visit: Payer: Self-pay | Admitting: Interventional Cardiology

## 2020-05-12 ENCOUNTER — Encounter: Payer: Self-pay | Admitting: Gastroenterology

## 2020-05-26 DIAGNOSIS — Z136 Encounter for screening for cardiovascular disorders: Secondary | ICD-10-CM | POA: Insufficient documentation

## 2020-05-26 HISTORY — DX: Encounter for screening for cardiovascular disorders: Z13.6
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
## 2020-06-01 ENCOUNTER — Other Ambulatory Visit: Payer: Self-pay

## 2020-06-01 ENCOUNTER — Encounter: Payer: Self-pay | Admitting: Interventional Cardiology

## 2020-06-01 ENCOUNTER — Ambulatory Visit: Payer: Medicare Other | Admitting: Interventional Cardiology

## 2020-06-01 ENCOUNTER — Other Ambulatory Visit: Payer: Self-pay | Admitting: Interventional Cardiology

## 2020-06-01 ENCOUNTER — Other Ambulatory Visit: Payer: Self-pay | Admitting: Gastroenterology

## 2020-06-01 VITALS — BP 108/56 | HR 70 | Ht 63.0 in | Wt 159.2 lb

## 2020-06-01 DIAGNOSIS — I2510 Atherosclerotic heart disease of native coronary artery without angina pectoris: Secondary | ICD-10-CM | POA: Diagnosis not present

## 2020-06-01 DIAGNOSIS — T466X5A Adverse effect of antihyperlipidemic and antiarteriosclerotic drugs, initial encounter: Secondary | ICD-10-CM

## 2020-06-01 DIAGNOSIS — I259 Chronic ischemic heart disease, unspecified: Secondary | ICD-10-CM | POA: Diagnosis not present

## 2020-06-01 DIAGNOSIS — I493 Ventricular premature depolarization: Secondary | ICD-10-CM

## 2020-06-01 DIAGNOSIS — G720 Drug-induced myopathy: Secondary | ICD-10-CM

## 2020-06-01 DIAGNOSIS — I10 Essential (primary) hypertension: Secondary | ICD-10-CM | POA: Diagnosis not present

## 2020-06-01 DIAGNOSIS — Z7189 Other specified counseling: Secondary | ICD-10-CM

## 2020-06-01 DIAGNOSIS — E785 Hyperlipidemia, unspecified: Secondary | ICD-10-CM | POA: Diagnosis not present

## 2020-06-01 NOTE — Patient Instructions (Signed)
Medication Instructions:  Your physician recommends that you continue on your current medications as directed. Please refer to the Current Medication list given to you today.  *If you need a refill on your cardiac medications before your next appointment, please call your pharmacy*   Lab Work: Lipid and Liver in December.  You will need to be fasting for these labs (nothing to eat or drink after midnight except water and black coffee).  If you have labs (blood work) drawn today and your tests are completely normal, you will receive your results only by: Marland Kitchen MyChart Message (if you have MyChart) OR . A paper copy in the mail If you have any lab test that is abnormal or we need to change your treatment, we will call you to review the results.   Testing/Procedures: None   Follow-Up:  Your physician recommends that you schedule a follow-up appointment with the Lipid Clinic shortly after your lab work.   At [facility], you and your health needs are our priority.  As part of our continuing mission to provide you with exceptional heart care, we have created designated Provider Care Teams.  These Care Teams include your primary Cardiologist (physician) and Advanced Practice Providers (APPs -  Physician Assistants and Nurse Practitioners) who all work together to provide you with the care you need, when you need it.  We recommend signing up for the patient portal called "MyChart".  Sign up information is provided on this After Visit Summary.  MyChart is used to connect with patients for Virtual Visits (Telemedicine).  Patients are able to view lab/test results, encounter notes, upcoming appointments, etc.  Non-urgent messages can be sent to your provider as well.   To learn more about what you can do with MyChart, go to [email or web address].    Your next appointment:   12 month(s)  The format for your next appointment:   In Person  Provider:   You may see Lesleigh Noe, MD or one  of the following Advanced Practice Providers on your designated Care Team:    Norma Fredrickson, NP  Nada Boozer, NP  Georgie Chard, NP    Other Instructions

## 2020-06-14 ENCOUNTER — Encounter: Payer: Self-pay | Admitting: Family Medicine

## 2020-06-14 LAB — HEMOGLOBIN A1C, FINGERSTICK: A1c: 4.6

## 2020-06-20 ENCOUNTER — Telehealth: Payer: Self-pay | Admitting: Interventional Cardiology

## 2020-06-20 NOTE — Telephone Encounter (Signed)
Pt c/o medication issue:  1. Name of Medication: Evolocumab with Infusor (REPATHA PUSHTRONEX SYSTEM) 420 MG/3.5ML SOCT  2. How are you currently taking this medication (dosage and times per day)? Every 30 days   3. Are you having a reaction (difficulty breathing--STAT)? Possibly   4. What is your medication issue? Magalie states she has been having mucus running down her throat as well as a runny nose. She is wanting to know if this could be due to this medication. She states it has been occurring since she started it. Please advise.

## 2020-06-21 NOTE — Telephone Encounter (Signed)
Yes runny nose and upper respiratory symptoms are one of the most common side effects from Repatha.  May have less symptoms if she switches back to the 140mg  dose every 2 weeks

## 2020-06-21 NOTE — Telephone Encounter (Signed)
Contacted patient regarding symptoms of runny nose and mucus build-up which is a common side effect of Repatha. Discussed she may have less upper respiratory symptoms if she switches back to Repatha 140 mg every 2 weeks. Patient was adamant that she did not want to give subcutaneous injections. Patient agreed to monitor symptoms for the next 1-2 months to see if symptoms go away and to contact [facility] if she has additional issues.   Fabio Neighbors, PharmD PGY2 Ambulatory Care Pharmacy Resident [facility] [address] [place], [place] [address] Phone: [phone]; Fax: [phone]

## 2020-06-23 ENCOUNTER — Telehealth: Payer: Self-pay | Admitting: Interventional Cardiology

## 2020-06-23 NOTE — Telephone Encounter (Signed)
New message    Pt c/o medication issue:  1. Name of Medication: repatha injection  2. How are you currently taking this medication (dosage and times per day)?   3. Are you having a reaction (difficulty breathing--STAT)? no  4. What is your medication issue? Patient said when they received this medication it was damaged or defected.  The original company heard their case and agreed to replace it free of cost to the patient.  This is the replacement company calling to get a "verbal" that it is ok to replace this medication and ship it to the patient.

## 2020-06-23 NOTE — Telephone Encounter (Signed)
Received fax from [employer].  Will complete form and send back

## 2020-06-24 NOTE — Telephone Encounter (Signed)
Form completed and faxed to [employer]

## 2020-06-28 ENCOUNTER — Telehealth: Payer: Self-pay | Admitting: Pharmacist

## 2020-06-28 NOTE — Telephone Encounter (Signed)
Fax authorizing replacement Repatha from last week did not go through.  Called [facility] at [phone] and spoke with pharmacist [facility] and authorized replacement over the phone.

## 2020-07-18 ENCOUNTER — Telehealth: Payer: Self-pay | Admitting: Interventional Cardiology

## 2020-07-18 NOTE — Telephone Encounter (Signed)
Spoke with pt and for the last week she has been feeling dizzy, fatigued, weak, low back pain and sore throat.  States she feels thirsty and hungry all the time but if she eats, she feels worse.  Advised pt to reach out to her PCP about her concerns because these symptoms did not sound cardiac related.  Pt in agreement with plan.

## 2020-07-18 NOTE — Telephone Encounter (Signed)
Pt called in and stated that she has not been feeling well the last couple of weeks and would like to know if Dr Katrinka Blazing would be ok for her to move up her lab work and would like to know if he could add checking her for diabetes?    Best number [phone]

## 2020-07-20 ENCOUNTER — Telehealth: Payer: Self-pay

## 2020-07-20 NOTE — Telephone Encounter (Signed)
Patient LVM on nurse line regarding symptoms she has been experiencing over the last week. Patient reports feeling thirsty, fatigue, dizziness, weakness and concerns with her appetite.   Returned phone call to patient to discuss in more detail. Patient did not answer and voicemail box is currently full.   Patient has also contacted cardiology, that advised her to contact PCP for further evaluation.   Veronda Prude, RN

## 2020-08-05 ENCOUNTER — Telehealth: Payer: Self-pay

## 2020-08-05 NOTE — Telephone Encounter (Signed)
-----   Message from Lemar Lofty., MD sent at 08/03/2020 10:56 PM EST ----- Regarding: Follow up Marylu Lund, This patient of Dr. Adela Lank needs a follow up in early 2022 to decide needs for repeat EGD and timing. Thanks. GM

## 2020-08-05 NOTE — Telephone Encounter (Signed)
Called pt. Scheduled her for an appt in [place]

## 2020-08-08 ENCOUNTER — Other Ambulatory Visit: Payer: Self-pay | Admitting: Interventional Cardiology

## 2020-08-15 DIAGNOSIS — H353131 Nonexudative age-related macular degeneration, bilateral, early dry stage: Secondary | ICD-10-CM | POA: Diagnosis not present

## 2020-08-15 DIAGNOSIS — H1045 Other chronic allergic conjunctivitis: Secondary | ICD-10-CM | POA: Diagnosis not present

## 2020-08-15 DIAGNOSIS — H04123 Dry eye syndrome of bilateral lacrimal glands: Secondary | ICD-10-CM | POA: Diagnosis not present

## 2020-08-15 DIAGNOSIS — Z961 Presence of intraocular lens: Secondary | ICD-10-CM | POA: Diagnosis not present

## 2020-08-16 ENCOUNTER — Telehealth: Payer: Self-pay | Admitting: Interventional Cardiology

## 2020-08-16 NOTE — Telephone Encounter (Signed)
New message:    patiernt calling stating that she went to go pick up her medication repatha and would like for some one to explain why medication is so much.

## 2020-08-16 NOTE — Telephone Encounter (Signed)
I spoke with patient. Her [employer] grant has expired and she is in the coverage gap. She did not pick up the medication. She does still have one more for Dec. I advised that we will hopefully be able to renew her grant in Jan. I advised her to call us in the new year and we will see what options we have. Inclisrin may be a possibility if it is cheaper like they suggest it will be.

## 2020-08-30 ENCOUNTER — Other Ambulatory Visit: Payer: Medicare Other

## 2020-09-01 ENCOUNTER — Other Ambulatory Visit: Payer: Self-pay

## 2020-09-01 ENCOUNTER — Other Ambulatory Visit: Payer: Medicare Other

## 2020-09-01 ENCOUNTER — Ambulatory Visit (INDEPENDENT_AMBULATORY_CARE_PROVIDER_SITE_OTHER): Payer: Medicare Other | Admitting: Family Medicine

## 2020-09-01 ENCOUNTER — Encounter: Payer: Self-pay | Admitting: Family Medicine

## 2020-09-01 VITALS — BP 110/56 | Ht 63.0 in | Wt 158.1 lb

## 2020-09-01 DIAGNOSIS — G3184 Mild cognitive impairment, so stated: Secondary | ICD-10-CM | POA: Diagnosis not present

## 2020-09-01 DIAGNOSIS — E785 Hyperlipidemia, unspecified: Secondary | ICD-10-CM | POA: Diagnosis not present

## 2020-09-01 DIAGNOSIS — I10 Essential (primary) hypertension: Secondary | ICD-10-CM

## 2020-09-01 DIAGNOSIS — J9801 Acute bronchospasm: Secondary | ICD-10-CM

## 2020-09-01 DIAGNOSIS — Z23 Encounter for immunization: Secondary | ICD-10-CM | POA: Diagnosis not present

## 2020-09-01 DIAGNOSIS — K52831 Collagenous colitis: Secondary | ICD-10-CM

## 2020-09-01 DIAGNOSIS — I700 Atherosclerosis of aorta: Secondary | ICD-10-CM

## 2020-09-01 DIAGNOSIS — I2510 Atherosclerotic heart disease of native coronary artery without angina pectoris: Secondary | ICD-10-CM

## 2020-09-01 LAB — HEPATIC FUNCTION PANEL
ALT: 9 IU/L (ref 0–32)
AST: 14 IU/L (ref 0–40)
Albumin: 4.1 g/dL (ref 3.7–4.7)
Alkaline Phosphatase: 101 IU/L (ref 44–121)
Bilirubin Total: 0.4 mg/dL (ref 0.0–1.2)
Bilirubin, Direct: 0.12 mg/dL (ref 0.00–0.40)
Total Protein: 7.1 g/dL (ref 6.0–8.5)

## 2020-09-01 LAB — BASIC METABOLIC PANEL
BUN/Creatinine Ratio: 9 — ABNORMAL LOW (ref 12–28)
BUN: 11 mg/dL (ref 8–27)
CO2: 19 mmol/L — ABNORMAL LOW (ref 20–29)
Calcium: 9.6 mg/dL (ref 8.7–10.3)
Chloride: 107 mmol/L — ABNORMAL HIGH (ref 96–106)
Creatinine, Ser: 1.18 mg/dL — ABNORMAL HIGH (ref 0.57–1.00)
GFR calc Af Amer: 52 mL/min/{1.73_m2} — ABNORMAL LOW (ref >59)
GFR calc non Af Amer: 45 mL/min/{1.73_m2} — ABNORMAL LOW (ref >59)
Glucose: 88 mg/dL (ref 65–99)
Potassium: 4.5 mmol/L (ref 3.5–5.2)
Sodium: 137 mmol/L (ref 134–144)

## 2020-09-01 LAB — LIPID PANEL
Chol/HDL Ratio: 3.2 ratio (ref 0.0–4.4)
Cholesterol, Total: 182 mg/dL (ref 100–199)
HDL: 57 mg/dL (ref >39)
LDL Chol Calc (NIH): 115 mg/dL — ABNORMAL HIGH (ref 0–99)
Triglycerides: 54 mg/dL (ref 0–149)
VLDL Cholesterol Cal: 10 mg/dL (ref 5–40)

## 2020-09-01 MED ORDER — ALBUTEROL SULFATE HFA 108 (90 BASE) MCG/ACT IN AERS: 2.0000 | INHALATION_SPRAY | Freq: Four times a day (QID) | RESPIRATORY_TRACT | 2 refills | Status: DC | PRN

## 2020-09-01 NOTE — Progress Notes (Signed)
[name] is alone Sources of clinical information for visit is/are patient and past medical records. Nursing assessment for this office visit was reviewed with the patient for accuracy and revision.     Previous Report(s) Reviewed: lab reports and office notes  Depression screen [facility] 2/9 09/01/2020  Decreased Interest 1  Down, Depressed, Hopeless 1  PHQ - 2 Score 2  Altered sleeping 0  Tired, decreased energy 0  Change in appetite 0  Feeling bad or failure about yourself  0  Trouble concentrating 0  Moving slowly or fidgety/restless 0  Suicidal thoughts 0  PHQ-9 Score 2  Difficult doing work/chores -  Some recent data might be hidden    Fall Risk  01/06/2020 06/11/2019 10/02/2018 05/09/2018 05/09/2017  Falls in the past year? 0 0 0 No Yes  Number falls in past yr: - - - - 1  Comment - - - - -  Injury with Fall? - - - - Yes  Comment - - - - -  Risk Factor Category  - - - - High Fall Risk  Risk for fall due to : - - - - History of fall(s)  Risk for fall due to: Comment - - - - -  Follow up - - - - Falls prevention discussed    PHQ9 SCORE ONLY 09/01/2020 01/06/2020 09/03/2019  PHQ-9 Total Score 2 1 10     Adult vaccines due  Topic Date Due  . TETANUS/TDAP  05/04/2018    Health Maintenance Due  Topic Date Due  . TETANUS/TDAP  05/04/2018      History/P.E. limitations: none  Adult vaccines due  Topic Date Due  . TETANUS/TDAP  05/04/2018   There are no preventive care reminders to display for this patient.  Health Maintenance Due  Topic Date Due  . 05/06/2018  05/04/2018     Chief Complaint  Patient presents with  . Annual Exam

## 2020-09-01 NOTE — Patient Instructions (Signed)
Your blood pressure is great.  Your weight is staying stable.  Think about adding a serving of fruit and or vegetable daily to your diet.    A refill of your albuterol inhaler was sent to your pharmacy.   Consider stopping your daily Aspirin.   A study that compared patients older than 70 who were taking aspirin with similar patients not taking aspirin.  There was no difference between the two groups in strokes or heart attacks.  The patients taking Aspirin were more likely to have a bleed and more likely to die than those patients not taking Asprin.   Consider getting your Tetanus/diptheria/pertussis (Tdap) booster vaccination at your pharmacy.  Tdap vaccination decreases the chance of grandparents giving whooping cough (pertussis) infection to children under 44 years old.  Consider rutting a drop of mineral or baby oil into your ear canal once a week to moisturize the skin and decrease dry skin itching in the ear canal.

## 2020-09-02 ENCOUNTER — Encounter: Payer: Self-pay | Admitting: Family Medicine

## 2020-09-02 DIAGNOSIS — I700 Atherosclerosis of aorta: Secondary | ICD-10-CM | POA: Insufficient documentation

## 2020-09-02 NOTE — Assessment & Plan Note (Addendum)
Established problem. Stable. Still performing all her iADLs independently.  Rediscussed role of socialization, exercise and Mediterranean diet (at least trying to incorporate some componenents into diet, such as a serving of a vegetable and a fruit each day)  Monitor ADLs annual.

## 2020-09-02 NOTE — Assessment & Plan Note (Signed)
Established problem. Stable. Usual 2-4 loose stools per day.  No change from baseline.

## 2020-09-02 NOTE — Assessment & Plan Note (Signed)
Established problem. Stable.  No signs of complications, medication side effects, or red flags. Continue current medications and other regiments.  

## 2020-09-02 NOTE — Assessment & Plan Note (Signed)
Established problem. Stable. [name] was concerned that she had a rash after her last Repatha therapy. She discussed it with her cardiologist.  No change in Repatha therapy made.  Continue Repathas, Antihypertensive therapies

## 2020-09-05 DIAGNOSIS — G720 Drug-induced myopathy: Secondary | ICD-10-CM | POA: Insufficient documentation

## 2020-09-05 HISTORY — DX: Drug-induced myopathy: G72.0

## 2020-09-06 ENCOUNTER — Ambulatory Visit: Payer: Medicare Other

## 2020-09-06 ENCOUNTER — Telehealth: Payer: Self-pay | Admitting: Interventional Cardiology

## 2020-09-06 NOTE — Telephone Encounter (Signed)
Patient wants Dr. Michaelle Copas nurse's opinion on whether or not it would be OK if she rescheduled her appt with the pharmacist today. Please advise.

## 2020-09-06 NOTE — Telephone Encounter (Signed)
Spoke with pt and she states that she has some things going on and wanted to know if it would be ok to postpone her appt until the new year.  Rescheduled pt to 10/04/20 at 4pm since she will already be in town for another appt.  Pt very appreciative for call.
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
## 2020-09-21 ENCOUNTER — Telehealth: Payer: Self-pay | Admitting: Interventional Cardiology

## 2020-09-21 NOTE — Telephone Encounter (Signed)
Returned call to pt, no answer and VM was full. Will try again later.

## 2020-09-21 NOTE — Telephone Encounter (Signed)
    Pt c/o medication issue:  1. Name of Medication:   REPATHA PUSHTRONEX SYSTEM 420 MG/3.5ML SOCT    2. How are you currently taking this medication (dosage and times per day)? INJECT 1 CARTRIDGE INTO THE SKIN EVERY 30 (THIRTY) DAYS.  3. Are you having a reaction (difficulty breathing--STAT)?   4. What is your medication issue? Pt would like to speak with Dr. Michaelle Copas nurse about her Repatha

## 2020-09-22 NOTE — Telephone Encounter (Signed)
Spoke with pt, she states Repatha rx was over $400 the last time she tried to get it filled. Looks like her [employer] expired. I was able to re-enroll pt and have provided information to her pharmacy. Pt was grateful for assistance.

## 2020-09-27 ENCOUNTER — Ambulatory Visit: Payer: Medicare Other | Admitting: Gastroenterology

## 2020-10-04 ENCOUNTER — Telehealth: Payer: Self-pay | Admitting: Pharmacist

## 2020-10-04 ENCOUNTER — Ambulatory Visit (INDEPENDENT_AMBULATORY_CARE_PROVIDER_SITE_OTHER): Payer: Medicare Other | Admitting: Gastroenterology

## 2020-10-04 ENCOUNTER — Ambulatory Visit: Payer: Medicare Other

## 2020-10-04 ENCOUNTER — Other Ambulatory Visit: Payer: Self-pay

## 2020-10-04 DIAGNOSIS — R1013 Epigastric pain: Secondary | ICD-10-CM

## 2020-10-04 DIAGNOSIS — K317 Polyp of stomach and duodenum: Secondary | ICD-10-CM

## 2020-10-04 DIAGNOSIS — K52831 Collagenous colitis: Secondary | ICD-10-CM

## 2020-10-04 NOTE — Telephone Encounter (Signed)
Attempted to call patient for phone appointment today.  No answer and mailbox was full

## 2020-10-04 NOTE — Patient Instructions (Signed)
If you are age 76 or older, your body mass index should be between 23-30. Your There is no height or weight on file to calculate BMI. If this is out of the aforementioned range listed, please consider follow up with your Primary Care Provider.  If you are age 56 or younger, your body mass index should be between 19-25. Your There is no height or weight on file to calculate BMI. If this is out of the aformentioned range listed, please consider follow up with your Primary Care Provider.   You can try Pepto-Bismol twice a day, as needed.  You can also try TUMS or Maalox as needed.  Please follow up in 6 months. We will remind you when it is time to schedule an appointment.   Thank you for entrusting me with your care and for choosing [facility], Dr. Ileene Patrick
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
## 2020-10-13 ENCOUNTER — Ambulatory Visit: Payer: Medicare Other

## 2020-10-20 ENCOUNTER — Encounter: Payer: Self-pay | Admitting: Pharmacist

## 2020-10-20 ENCOUNTER — Ambulatory Visit (INDEPENDENT_AMBULATORY_CARE_PROVIDER_SITE_OTHER): Payer: Medicare Other | Admitting: Pharmacist

## 2020-10-20 DIAGNOSIS — G720 Drug-induced myopathy: Secondary | ICD-10-CM

## 2020-10-20 DIAGNOSIS — E7800 Pure hypercholesterolemia, unspecified: Secondary | ICD-10-CM

## 2020-10-20 DIAGNOSIS — T466X5D Adverse effect of antihyperlipidemic and antiarteriosclerotic drugs, subsequent encounter: Secondary | ICD-10-CM
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
## 2020-10-31 ENCOUNTER — Other Ambulatory Visit: Payer: Self-pay | Admitting: Gastroenterology

## 2020-11-25 ENCOUNTER — Telehealth: Payer: Self-pay | Admitting: Interventional Cardiology

## 2020-11-25 DIAGNOSIS — E7800 Pure hypercholesterolemia, unspecified: Secondary | ICD-10-CM

## 2020-11-25 DIAGNOSIS — G3184 Mild cognitive impairment, so stated: Secondary | ICD-10-CM

## 2020-11-25 NOTE — Addendum Note (Signed)
Addended by: Malena Peer D on: 11/25/2020 02:25 PM   Modules accepted: Orders

## 2020-11-25 NOTE — Telephone Encounter (Signed)
    Pt c/o medication issue:  1. Name of Medication: REPATHA PUSHTRONEX SYSTEM 420 MG/3.5ML SOCT  2. How are you currently taking this medication (dosage and times per day)? INJECT 1 CARTRIDGE INTO THE SKIN EVERY 30 (THIRTY) DAYS.  3. Are you having a reaction (difficulty breathing--STAT)?   4. What is your medication issue? Pt said she thinks she is having a reactions with repatha, she is having hives on her arms and all of her body itches. She wanted to speak with the pharmacist.

## 2020-11-25 NOTE — Telephone Encounter (Signed)
Called patient back.She states she has been getting a rash on her chest and arms w/ little bumps.She is very itchy. Also has a sore throat and nose runny. Happens about 1-2 weeks after Repatha infusion I have asked her to stop taking the Repatha. Suggested we try Praluent, but patient states she cannot give herself the injection. We discussed Leqvio. Patient unsure. I advised that she could come in and sign the service center form so we could see how much it would cost. Pt states she cant come in. I gave her the website [email or web address] that she could sign online. She said she would get her daughter to help her.

## 2020-12-15 NOTE — Telephone Encounter (Signed)
Pt called in and stated having issues doing online so I mailed her a leqvio starter form

## 2021-01-03 MED ORDER — NEXLIZET 180-10 MG PO TABS: 1.0000 | ORAL_TABLET | Freq: Every day | ORAL | 11 refills | Status: DC

## 2021-01-03 NOTE — Addendum Note (Signed)
Addended by: Dietra Stokely E on: 01/03/2021 03:51 PM   Modules accepted: Orders

## 2021-01-03 NOTE — Telephone Encounter (Signed)
Called pharmacy to give [employer] grant and they stated that they already had that and the cost is $0

## 2021-01-03 NOTE — Addendum Note (Signed)
Addended by: Eather Colas on: 01/03/2021 04:57 PM   Modules accepted: Orders

## 2021-01-03 NOTE — Telephone Encounter (Addendum)
Leqvio copay cost prohibitive at 20% until pt meets $4500 out of pocket deductible. She does not qualify for ORION-4 trial. Previously experienced itching on Repatha Pushtronix, can't self-administer Praluent injection to rechallenge, and is statin intolerant (we have pravastatin 20mg  daily on our prior records, she also believes she tried rosuvastatin and atorvastatin). Would recommend trying Nexlizet instead. Pt is agreeable to try therapy. Her LDL is 115 and goal is < 70 given history of CAD. Will submit prior authorization.

## 2021-01-03 NOTE — Telephone Encounter (Addendum)
Prior authorization approved through 09/16/21, rx sent to pharmacy.

## 2021-01-03 NOTE — Telephone Encounter (Signed)
Called and spoke w/pt regarding fasting lipids, cmet, and hepatic panel needed to be completed in 3 months we got it scheduled and pt voiced understanding

## 2021-01-04 ENCOUNTER — Other Ambulatory Visit: Payer: Self-pay | Admitting: Family Medicine

## 2021-01-04 DIAGNOSIS — Z1231 Encounter for screening mammogram for malignant neoplasm of breast: Secondary | ICD-10-CM

## 2021-01-06 ENCOUNTER — Ambulatory Visit
Admission: RE | Admit: 2021-01-06 | Discharge: 2021-01-06 | Disposition: A | Discharge status: Home / Self Care | Payer: Medicare Other | Source: Ambulatory Visit | Attending: Family Medicine | Admitting: Family Medicine

## 2021-01-06 ENCOUNTER — Other Ambulatory Visit: Payer: Self-pay

## 2021-01-06 DIAGNOSIS — Z1231 Encounter for screening mammogram for malignant neoplasm of breast: Secondary | ICD-10-CM | POA: Diagnosis not present

## 2021-01-12 ENCOUNTER — Telehealth: Payer: Self-pay | Admitting: Interventional Cardiology

## 2021-01-12 NOTE — Telephone Encounter (Signed)
Spoke with pt and made her aware that I have sent this message over to the pharmacy team to review and see what else she may be able to take. Advised to stop Nexlizet.  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  Advised I will have PharmD team reach out.

## 2021-01-12 NOTE — Telephone Encounter (Addendum)
If she is experiencing throat closing on Nexlizet, agree she should stop medication due to allergic reaction. Nexlizet does not contain shellfish or have any cross-sensitivity so this is a separate reaction. Spoke with pt who reports experiencing throat swelling as well as nausea after only her 2nd dose of Nexlizet. She is starting to feel better now. Advised pt she can take an antihistamine to help. Will update her allergy list.   She is already intolerant to statins - pravastatin 20mg  daily, atorvastatin, simvastatin, and rosuvastatin (myalgias), Repatha (itching), cholestyramine, and niacin. Her insurance plan does not cover Leqvio well and she doesn't qualify for the ORION-4 trial. She does tolerate fish oil just fine and is still taking this. Won't qualify for Vascepa since her TG are in the 50s.  Do not have any other options unless pt wishes to rechallenge with low dose/frequency statin like rosuvastatin 5mg  3x/week. Pt wishes to think about it and will let know if she wishes to try this. I have canceled upcoming lipid panel in July since pt is not continuing on Nexlizet.

## 2021-01-12 NOTE — Telephone Encounter (Signed)
Pt c/o medication issue:  1. Name of Medication: Bempedoic Acid-Ezetimibe (NEXLIZET) 180-10 MG TABS  2. How are you currently taking this medication (dosage and times per day)? Not currently taking   3. Are you having a reaction (difficulty breathing--STAT)? No   4. 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

## 2021-02-02 ENCOUNTER — Encounter: Payer: Self-pay | Admitting: Family Medicine

## 2021-02-02 ENCOUNTER — Other Ambulatory Visit: Payer: Self-pay

## 2021-02-02 ENCOUNTER — Ambulatory Visit (INDEPENDENT_AMBULATORY_CARE_PROVIDER_SITE_OTHER): Payer: Medicare Other | Admitting: Family Medicine

## 2021-02-02 VITALS — BP 108/62 | HR 76 | Ht 63.0 in | Wt 154.1 lb

## 2021-02-02 DIAGNOSIS — M8589 Other specified disorders of bone density and structure, multiple sites: Secondary | ICD-10-CM

## 2021-02-02 DIAGNOSIS — I10 Essential (primary) hypertension: Secondary | ICD-10-CM

## 2021-02-02 DIAGNOSIS — E7800 Pure hypercholesterolemia, unspecified: Secondary | ICD-10-CM | POA: Diagnosis not present

## 2021-02-02 DIAGNOSIS — R519 Headache, unspecified: Secondary | ICD-10-CM

## 2021-02-02 DIAGNOSIS — M1712 Unilateral primary osteoarthritis, left knee: Secondary | ICD-10-CM | POA: Diagnosis not present

## 2021-02-02 DIAGNOSIS — I700 Atherosclerosis of aorta: Secondary | ICD-10-CM

## 2021-02-02 DIAGNOSIS — Z91010 Allergy to peanuts: Secondary | ICD-10-CM

## 2021-02-02 DIAGNOSIS — K52831 Collagenous colitis: Secondary | ICD-10-CM | POA: Diagnosis not present

## 2021-02-02 NOTE — Patient Instructions (Signed)
A referral was sent to th e allergist to help you figure out if you have a peanut allergy.        Diastasis Recti  Diastasis recti is a condition in which the muscles of the abdomen (rectus abdominis muscles) become thin and separate. The result is a wider space between the muscles of the right and left abdomen (abdominal muscles). This wider space between the muscles may cause a bulge in the middle of the abdomen. This bulge may be noticed when a person is straining or when he or she sits up after lying down. Diastasis recti can affect men and women. It is most common among pregnant women, babies, people with obesity, and people who have had abdominal surgery. Exercise or surgery may help correct this condition. What are the causes? Common causes of this condition include:  Pregnancy. As the uterus grows in size, it puts pressure on the abdominal muscles, causing the muscles to separate.  Obesity. Excess fat puts pressure on abdominal muscles.  Weight lifting.  Some exercises of the abdomen.  Advanced age.  Genetics.  Having had surgery on the abdomen before. What increases the risk? This condition is more likely to develop in:  Women.  Newborns, especially newborns who are born early (prematurely). What are the signs or symptoms? Common symptoms of this condition include:  A bulge in the middle of your abdomen. You will notice it most when you sit up or strain.  Pain in your low back, hips, or the area between your hip bones (pelvis).  Constipation.  Being unable to control when you urinate (urinary incontinence).  Bloating.  Poor posture. How is this diagnosed? This condition is diagnosed with a physical exam. During the exam, your health care provider will ask you to lie flat on your back and do a crunch or half sit-up. If you have diastasis recti, a bulge will appear lengthwise between your abdominal muscles in the center of your abdomen. Your health care provider  will measure the gap between your muscles with one of the following:  A medical device used to measure the space between two objects (caliper).  A tape measure.  CT scan.  Ultrasound.  Finger spaces. Your health care provider will measure the space using his or her fingers. How is this treated? If your muscle separation is not too large, you may not need treatment. However, if you are a woman who plans to become pregnant again, you should treat this condition before your next pregnancy. Treatment may include:  Physical therapy exercises to strengthen and tighten your abdominal muscles.  Lifestyle changes such as weight loss and exercise.  Over-the-counter pain medicines as needed.  Surgery to correct the separation. Follow these instructions at home: Activity  Return to your normal activities as told by your health care provider. Ask your health care provider what activities are safe for you.  Do exercises as told by your health care provider. Make sure you are doing your exercises and movements correctly when lifting weights or doing exercises using your abdominal muscles or the muscles in the center of your body that give stability (core muscles). Proper form can help to prevent this condition from happening again. General instructions  If you are overweight, ask your health care provider for help with weight loss. Losing even a small amount of weight can help to improve your diastasis recti.  Take over-the-counter or prescription medicines only as told by your health care provider.  Do not strain. Straining  can make the separation worse. Examples of straining include: ? Pushing hard to have a bowel movement, such as when you have constipation. ? Lifting heavy objects or lifting children. ? Standing up and sitting down.  You may need to take these actions to prevent or treat constipation: ? Drink enough fluid to keep your urine pale yellow. ? Take over-the-counter or  prescription medicines. ? Eat foods that are high in fiber, such as beans, whole grains, and fresh fruits and vegetables. ? Limit foods that are high in fat and processed sugars, such as fried or sweet foods.  Keep all follow-up visits. This is important. Contact a health care provider if:  You notice a new bulge in your abdomen. Get help right away if:  You experience severe discomfort in your abdomen.  You develop severe abdominal pain along with nausea, vomiting, or a fever. Summary  Diastasis recti is a condition in which the muscles of the abdomen (rectus abdominismuscles) become thin and separate. You may notice a bulge in your abdomen because the space has widened between the muscles of the right and left abdomen.  The most common symptom is a bulge in the middle of your abdomen. You will notice it most when you sit up or strain.  This condition is diagnosed with a physical exam.  If the muscle separation is not too big, you may not need treatment. Otherwise, you may need to do physical therapy or have surgery. This information is not intended to replace advice given to you by your health care provider. Make sure you discuss any questions you have with your health care provider. Document Revised: 05/06/2020 Document Reviewed: 05/06/2020 Elsevier Patient Education  2021 [employer].

## 2021-02-03 ENCOUNTER — Encounter: Payer: Self-pay | Admitting: Family Medicine

## 2021-02-03 DIAGNOSIS — Z91010 Allergy to peanuts: Secondary | ICD-10-CM | POA: Insufficient documentation

## 2021-02-03 NOTE — Assessment & Plan Note (Signed)
Established problem [name] has been intol of statin, bempedoic acd/ezetemibe, Repatha [facility] recommends trial of rosuvastatin 5 mg daily.  I encouraged her to try it.

## 2021-02-03 NOTE — Assessment & Plan Note (Signed)
Established problem. Stable.  No signs of complications, medication side effects, or red flags. Continue current medications and other regiments.  

## 2021-02-03 NOTE — Assessment & Plan Note (Signed)
Established problem Recent episodes of stabbing headache x 2, both left and right temporal regions.  Lasting seconds. No disabling headache.  No red flags.  Monitor for now.  May use Tylenol.

## 2021-02-03 NOTE — Progress Notes (Signed)
[name] is alone Sources of clinical information for visit is/are patient and past medical records. Nursing assessment for this office visit was reviewed with the patient for accuracy and revision.     Previous Report(s) Reviewed: lab reports, office notes and lipid clinic notes  Depression screen [facility] 2/9 02/02/2021  Decreased Interest 3  Down, Depressed, Hopeless 0  PHQ - 2 Score 3  Altered sleeping 2  Tired, decreased energy 3  Change in appetite 2  Feeling bad or failure about yourself  2  Trouble concentrating 3  Moving slowly or fidgety/restless 0  Suicidal thoughts 0  PHQ-9 Score 15  Difficult doing work/chores Not difficult at all  Some recent data might be hidden    Fall Risk  02/02/2021 01/06/2020 06/11/2019 10/02/2018 05/09/2018  Falls in the past year? 1 0 0 0 No  Number falls in past yr: 0 - - - -  Comment - - - - -  Injury with Fall? 1 - - - -  Comment - - - - -  Risk Factor Category  - - - - -  Risk for fall due to : - - - - -  Risk for fall due to: Comment - - - - -  Follow up - - - - -    PHQ9 SCORE ONLY 02/02/2021 09/01/2020 01/06/2020  PHQ-9 Total Score 15 2 1     Adult vaccines due  Topic Date Due  . TETANUS/TDAP  09/08/2030    There are no preventive care reminders to display for this patient.    History/P.E. limitations: none  Adult vaccines due  Topic Date Due  . TETANUS/TDAP  09/08/2030   There are no preventive care reminders to display for this patient. There are no preventive care reminders to display for this patient.   Chief Complaint  Patient presents with  . Headache

## 2021-02-03 NOTE — Assessment & Plan Note (Signed)
Ms Haider has reported a personal history of allergy to peanuts (throat tightening) She recently had an episode of tightness in her throat after eating a cracker with peanut butter.   She requesting to see Allergist for evaluation of question of her actually having a peanut allergy.

## 2021-02-03 NOTE — Assessment & Plan Note (Signed)
Established problem. Adequate blood pressure control.  No evidence of new end organ damage.  Tolerating medication without significant adverse effects.  Plan to continue current blood pressure medication regiment.   

## 2021-02-03 NOTE — Assessment & Plan Note (Signed)
Established problem Left knee has given way. Use Tylenol for pain May use Aleve (OTC) prn breakthrough pain

## 2021-02-03 NOTE — Assessment & Plan Note (Signed)
See Aortic atherosclerosis

## 2021-02-06 ENCOUNTER — Other Ambulatory Visit: Payer: Self-pay | Admitting: Interventional Cardiology

## 2021-02-06 DIAGNOSIS — E876 Hypokalemia: Secondary | ICD-10-CM

## 2021-04-04 ENCOUNTER — Other Ambulatory Visit: Payer: Medicare Other

## 2021-04-06 ENCOUNTER — Other Ambulatory Visit: Payer: Self-pay | Admitting: Family Medicine

## 2021-04-06 DIAGNOSIS — J9801 Acute bronchospasm: Secondary | ICD-10-CM

## 2021-04-09 ENCOUNTER — Other Ambulatory Visit: Payer: Self-pay | Admitting: Interventional Cardiology

## 2021-04-19 ENCOUNTER — Telehealth: Payer: Self-pay | Admitting: Interventional Cardiology

## 2021-04-19 NOTE — Telephone Encounter (Signed)
Pt called in back in April about ann allergic reaction to Nexlizet.  She was going to think about whether or not to rechallenge low dose statin therapy and call back.  Pt calling back today about statin therapy. Pt was being seen in the [facility].  Will send to PharmD team to address.

## 2021-04-19 NOTE — Telephone Encounter (Signed)
pt states that she is allergic to the medication given to her for cholestrol (doesnt remember the name) someone was supposed to be contacting her in regards to alternatives and she hasnt heard anything back... please advise

## 2021-04-20 MED ORDER — ROSUVASTATIN CALCIUM 5 MG PO TABS: 5.0000 mg | ORAL_TABLET | ORAL | 3 refills | Status: DC

## 2021-04-20 NOTE — Telephone Encounter (Signed)
Spoke with patient. She is willing to try rosuvastatin 5mg  three times a week. I have sent Rx to pharmacy. I advised she stop taking red yeast rice. I will follow up with her in 2 months to see how she is doing and check labs.

## 2021-05-02 ENCOUNTER — Telehealth: Payer: Self-pay | Admitting: Interventional Cardiology

## 2021-05-02 NOTE — Telephone Encounter (Signed)
Pt c/o medication issue:  1. Name of Medication: Rosuvastatin  2. How are you currently taking this medication (dosage and times per day)? Monday, Wednesday and Friday  3. Are you having a reaction (difficulty breathing--STAT)? no  4. What is your medication issue? She wants to know the days she does not take the Rosuvastatin, is it alright for her to still take the Co-Q-10?

## 2021-05-02 NOTE — Telephone Encounter (Signed)
Spoke with pt and made her aware that it is ok to take her Co Q10 on the days everyday as prescribed, including the ones she's not taking the Rosuvastatin.  Pt appreciative for call.

## 2021-05-24 ENCOUNTER — Telehealth: Payer: Self-pay

## 2021-05-24 NOTE — Telephone Encounter (Signed)
Patient calls nurse line requesting detailed statement excusing her from jury duty. Patient states that due to colitis, she would not be able to serve on jury.   Patient reports that summons was for 10/25, juror #[phone].  Patient would like letter to be mailed to home address.   Please advise.   Veronda Prude, RN

## 2021-05-26 ENCOUNTER — Encounter: Payer: Self-pay | Admitting: Family Medicine

## 2021-05-26 NOTE — Telephone Encounter (Signed)
Letter printed. Placed in outgoing mail bin.   Veronda Prude, RN

## 2021-05-26 NOTE — Telephone Encounter (Signed)
Jury letter written.

## 2021-05-26 NOTE — Progress Notes (Signed)
Jury excuse letter

## 2021-05-31 ENCOUNTER — Ambulatory Visit: Payer: Medicare Other | Admitting: Gastroenterology

## 2021-05-31 ENCOUNTER — Encounter: Payer: Self-pay | Admitting: Gastroenterology

## 2021-05-31 VITALS — BP 120/74 | HR 68 | Ht 61.5 in | Wt 157.4 lb

## 2021-05-31 DIAGNOSIS — K317 Polyp of stomach and duodenum: Secondary | ICD-10-CM

## 2021-05-31 DIAGNOSIS — K52831 Collagenous colitis: Secondary | ICD-10-CM | POA: Diagnosis not present

## 2021-05-31 NOTE — Patient Instructions (Signed)
If you are age 76 or older, your body mass index should be between 23-30. Your Body mass index is 29.25 kg/m. If this is out of the aforementioned range listed, please consider follow up with your Primary Care Provider.  If you are age 30 or younger, your body mass index should be between 19-25. Your Body mass index is 29.25 kg/m. If this is out of the aformentioned range listed, please consider follow up with your Primary Care Provider.   __________________________________________________________  The [facility] providers would like to encourage you to use [facility] to communicate with providers for non-urgent requests or questions.  Due to long hold times on the telephone, sending your provider a message by [facility] may be a faster and more efficient way to get a response.  Please allow 48 business hours for a response.  Please remember that this is for non-urgent requests.   You have been given a sample of Ortikos - Take one a day for a month.  Then call Dr. Lanetta Inch office and speak with his nurse Sharol Harness to let her know how you are doing.
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
## 2021-06-01 ENCOUNTER — Telehealth: Payer: Self-pay | Admitting: Gastroenterology

## 2021-06-01 NOTE — Telephone Encounter (Signed)
Spoke with patient in regards to recommendations. Pt states that she will try it and if she noticed any swelling she will call us and let us know. Advised patient that she will have to try this for a few weeks before she may notice a difference in her symptoms but if she has any concerns to call us. Confirmed that patient is to take 1 capsule (9 mg total) daily. Pt verbalized understanding and had no concerns at the end of the call.

## 2021-06-01 NOTE — Telephone Encounter (Signed)
Brooklyn can you help book this patient for me for an EGD in the [facility] to survey history of gastric polyp? I spoke with GM about this, I will take care of her EGD. Thank you

## 2021-06-01 NOTE — Telephone Encounter (Signed)
Okay thank you for scheduling.  I had a long conversation with [name] about the budesonide yesterday. She has declined this for years due to cost. I was able to get her a 30 day free sample and I think it will really help her. There is minimal systemic absorption compared to prednisone which may have caused her swelling in the past. I think safe to try, whether this is budesonide genetic, Uceris, or the sample I gave her, all work about the same and I think safe. She has failed or declined other options that I outlined in my clinic note. Up to her if she wants to try this in regards to how much her bowels are bothering her.

## 2021-06-01 NOTE — Telephone Encounter (Signed)
Spoke with patient to schedule her EGD with Dr. Adela Lank. Pt has been scheduled for a pre-visit on Tuesday, 06/20/21 at 1 PM. Pt is aware that she will need to check in on the 3rd floor for this appt. Pt is scheduled for an EGD with Dr. Adela Lank on Wednesday, 07/05/21 at 10 am. Pt is aware that she needs to arrive on the 4th floor by 9 am with a care partner. Pt also had concerns about the Budesonide samples that were given to her. Pt states that she did not want to try Budesonide because she was given this before and it caused her to swell. Please advise if there is anything else you would like patient to try. Thanks

## 2021-06-12 ENCOUNTER — Telehealth: Payer: Self-pay | Admitting: Gastroenterology

## 2021-06-12 NOTE — Telephone Encounter (Signed)
Called patient. Patient reports when she took Ortikos medication (given to her at her appointment on 9-14) for about 6 to 7 days she experienced swelling in her face and stopped taking it.  Please advise with further recommendations.

## 2021-06-12 NOTE — Telephone Encounter (Signed)
Sorry to hear this. Can you help clarify: - did the Ortikos help her diarrhea? - did her swelling do away after stopping it? - really tough situation, not sure if she could try the generic budesonide to see if better tolerated but had been too expensive for her in the past and she has failed alternatives or not wished to try other things

## 2021-06-12 NOTE — Telephone Encounter (Signed)
Called and spoke to patient. She indicates the Ortikos did not help the diarrhea.  She indicates the swelling is about the same since she last took it 2 days ago on Saturday.  She has made no other medication or dietary changes lately. She wanted to reiterate that her diarrhea is something she has dealt with for years and she has come to grips with it.  She indicates that she does not take the Imodium very often but indicated perhaps she will take that more consistently. I recommended she take it every morning. She said what she was really coming to you for was about the pain in her abdomen "where her surgery was". She indicates she is satisfied with the discussion you had regarding that at her last visit and they decision to do an EGD in October. We confirmed the dates and times of her procedure and previsit.

## 2021-06-12 NOTE — Telephone Encounter (Signed)
Inbound call from patient. Requesting a call back to discuss medications. Would not go into detail

## 2021-06-13 NOTE — Telephone Encounter (Signed)
Called patient but mailbox is full and could not leave a message

## 2021-06-13 NOTE — Telephone Encounter (Signed)
Okay thanks Jan. Management of her diarrhea is based on how much this bothers her. Okay to stop Ortikos. If swelling still persistent unclear if this is med related or not, if persists or worsens she should see her PCP. Thanks

## 2021-06-13 NOTE — Telephone Encounter (Signed)
Called and spoke to patient. She indicates she is having swelling in other parts of her body besides just her face. I verified the last time she took the Ortikos was Saturday. I encouraged her to call her PCP right away. She expressed understanding

## 2021-06-20 ENCOUNTER — Ambulatory Visit (AMBULATORY_SURGERY_CENTER): Payer: Medicare Other | Admitting: *Deleted

## 2021-06-20 ENCOUNTER — Other Ambulatory Visit: Payer: Self-pay

## 2021-06-20 VITALS — Ht 61.5 in | Wt 157.0 lb

## 2021-06-20 DIAGNOSIS — K317 Polyp of stomach and duodenum: Secondary | ICD-10-CM

## 2021-06-23 ENCOUNTER — Encounter: Payer: Self-pay | Admitting: Gastroenterology

## 2021-06-28 ENCOUNTER — Telehealth: Payer: Self-pay

## 2021-06-28 ENCOUNTER — Telehealth: Payer: Self-pay | Admitting: Pharmacist

## 2021-06-28 DIAGNOSIS — R051 Acute cough: Secondary | ICD-10-CM

## 2021-06-28 DIAGNOSIS — E785 Hyperlipidemia, unspecified: Secondary | ICD-10-CM

## 2021-06-28 NOTE — Addendum Note (Signed)
Addended by: Malena Peer D on: 06/28/2021 10:06 AM   Modules accepted: Orders

## 2021-06-28 NOTE — Telephone Encounter (Signed)
Patient calls nurse line reporting an ongoing cough for 2 weeks. Patient reports nasal drip, however no other symptoms. Patient reports her friend told her to try tessalon pearls, however is unsure due to her multiple allergies. Patient did not realize this is a prescription medication. Patient is requesting PCP to send in tessalon if appropriate. Please advise.

## 2021-06-28 NOTE — Telephone Encounter (Signed)
Called pt to see how she was going on rosuvastatin 5mg  three times a week. Patient states she is doing well. Set up for labs 11/3.

## 2021-06-29 MED ORDER — BENZONATATE 200 MG PO CAPS: 200.0000 mg | ORAL_CAPSULE | Freq: Two times a day (BID) | ORAL | 0 refills | Status: DC | PRN

## 2021-07-02 ENCOUNTER — Other Ambulatory Visit: Payer: Self-pay | Admitting: Interventional Cardiology

## 2021-07-02 DIAGNOSIS — E876 Hypokalemia: Secondary | ICD-10-CM

## 2021-07-05 ENCOUNTER — Encounter: Payer: Self-pay | Admitting: Gastroenterology

## 2021-07-05 ENCOUNTER — Other Ambulatory Visit: Payer: Self-pay

## 2021-07-05 ENCOUNTER — Ambulatory Visit (AMBULATORY_SURGERY_CENTER): Payer: Medicare Other | Admitting: Gastroenterology

## 2021-07-05 VITALS — BP 119/71 | HR 78 | Temp 97.7°F | Resp 14 | Ht 61.5 in | Wt 157.0 lb

## 2021-07-05 DIAGNOSIS — K317 Polyp of stomach and duodenum: Secondary | ICD-10-CM

## 2021-07-05 DIAGNOSIS — K31A0 Gastric intestinal metaplasia, unspecified: Secondary | ICD-10-CM | POA: Diagnosis not present

## 2021-07-05 DIAGNOSIS — K449 Diaphragmatic hernia without obstruction or gangrene: Secondary | ICD-10-CM | POA: Diagnosis not present

## 2021-07-05 MED ORDER — SODIUM CHLORIDE 0.9 % IV SOLN: 500.0000 mL | Freq: Once | INTRAVENOUS | Status: DC

## 2021-07-05 NOTE — Patient Instructions (Signed)
Await pathology  Please read over handout about hiatal hernias  Continue your normal medications   YOU HAD AN ENDOSCOPIC PROCEDURE TODAY AT THE [facility]:   Refer to the procedure report that was given to you for any specific questions about what was found during the examination.  If the procedure report does not answer your questions, please call your gastroenterologist to clarify.  If you requested that your care partner not be given the details of your procedure findings, then the procedure report has been included in a sealed envelope for you to review at your convenience later.  YOU SHOULD EXPECT: Some feelings of bloating in the abdomen. Passage of more gas than usual.  Walking can help get rid of the air that was put into your GI tract during the procedure and reduce the bloating.   Please Note:  You might notice some irritation and congestion in your nose or some drainage.  This is from the oxygen used during your procedure.  There is no need for concern and it should clear up in a day or so.  SYMPTOMS TO REPORT IMMEDIATELY:  Following upper endoscopy (EGD)  Vomiting of blood or coffee ground material  New chest pain or pain under the shoulder blades  Painful or persistently difficult swallowing  New shortness of breath  Fever of 100F or higher  Black, tarry-looking stools  For urgent or emergent issues, a gastroenterologist can be reached at any hour by calling [phone]. Do not use MyChart messaging for urgent concerns.    DIET:  We do recommend a small meal at first, but then you may proceed to your regular diet.  Drink plenty of fluids but you should avoid alcoholic beverages for 24 hours.  ACTIVITY:  You should plan to take it easy for the rest of today and you should NOT DRIVE or use heavy machinery until tomorrow (because of the sedation medicines used during the test).    FOLLOW UP: Our staff will call the number listed on your records 48-72 hours  following your procedure to check on you and address any questions or concerns that you may have regarding the information given to you following your procedure. If we do not reach you, we will leave a message.  We will attempt to reach you two times.  During this call, we will ask if you have developed any symptoms of COVID 19. If you develop any symptoms (ie: fever, flu-like symptoms, shortness of breath, cough etc.) before then, please call [phone].  If you test positive for Covid 19 in the 2 weeks post procedure, please call and report this information to [place].    If any biopsies were taken you will be contacted by phone or by letter within the next 1-3 weeks.  Please call us at [phone] if you have not heard about the biopsies in 3 weeks.    SIGNATURES/CONFIDENTIALITY: You and/or your care partner have signed paperwork which will be entered into your electronic medical record.  These signatures attest to the fact that that the information above on your After Visit Summary has been reviewed and is understood.  Full responsibility of the confidentiality of this discharge information lies with you and/or your care-partner.

## 2021-07-05 NOTE — Progress Notes (Signed)
Pt's states no medical or surgical changes since previsit or office visit. 

## 2021-07-05 NOTE — Progress Notes (Signed)
Called to room to assist during endoscopic procedure.  Patient ID and intended procedure confirmed with present staff. Received instructions for my participation in the procedure from the performing physician.  

## 2021-07-05 NOTE — Progress Notes (Signed)
To PACU< VSS. Report to [email or web address] 

## 2021-07-05 NOTE — Progress Notes (Signed)
VS taken by DT 

## 2021-07-05 NOTE — Op Note (Signed)
[facility] Patient Name: [name] Procedure Date: 07/05/2021 10:17 AM MRN: [id number] Endoscopist: Viviann Spare P. Adela Lank , MD Age: 75 Referring MD:  Date of Birth: [birth year] Gender: Female Account #: [id number] Procedure:                Upper GI endoscopy Indications:              Follow-up of gastric polyps - large gastric polyp                            removed by Dr. Meridee Score with EMR in 04/2020 -                            "polypoid hyperplastic gastric mucosa with dense                            plasma cell infiltrate, consistent with Benita Gutter body                            gastritis", here for surveillance Medicines:                Monitored Anesthesia Care Procedure:                Pre-Anesthesia Assessment:                           - Prior to the procedure, a History and Physical                            was performed, and patient medications and                            allergies were reviewed. The patient's tolerance of                            previous anesthesia was also reviewed. The risks                            and benefits of the procedure and the sedation                            options and risks were discussed with the patient.                            All questions were answered, and informed consent                            was obtained. Prior Anticoagulants: The patient has                            taken no previous anticoagulant or antiplatelet                            agents. ASA Grade Assessment: III - A patient with  severe systemic disease. After reviewing the risks                            and benefits, the patient was deemed in                            satisfactory condition to undergo the procedure.                           After obtaining informed consent, the endoscope was                            passed under direct vision. Throughout the                            procedure, the  patient's blood pressure, pulse, and                            oxygen saturations were monitored continuously. The                            GIF [id number] #[id number] was introduced through the                            mouth, and advanced to the second part of duodenum.                            The upper GI endoscopy was accomplished without                            difficulty. The patient tolerated the procedure                            well. Scope In: Scope Out: Findings:                 Esophagogastric landmarks were identified: the                            Z-line was found at 34 cm, the gastroesophageal                            junction was found at 34 cm and the upper extent of                            the gastric folds was found at 36 cm from the                            incisors.                           A 2 cm hiatal hernia was present.                           The exam of the  esophagus was otherwise normal.                           Two areas of a small amount of residual polypoid                            tissue were found in the cardia and in the gastric                            fundus, on either end of a large EMR scar. The                            distal portion had 2 adherent clips from prior EMR.                            They were not easily removable and embedded into                            the mucosa. Biopsies were taken with a cold forceps                            for histology from this site. Another smaller area                            of residual polypoid tissue noted at the proximal                            end of the scar near the cardia was biopsied.                            Mucosa is benign appearing, did not appear                            adenomatous.                           The exam of the stomach was otherwise normal.                           The duodenal bulb and second portion of the                            duodenum  were normal. Complications:            No immediate complications. Estimated blood loss:                            Minimal. Estimated Blood Loss:     Estimated blood loss was minimal. Impression:               - Esophagogastric landmarks identified.                           - 2 cm hiatal hernia.                           -  Normal esophagus otherwise                           - Large EMR scar from prior polypectomy with distal                            and proximal end with some residual polypoid                            tissue, one area with embedded clips. This appears                            benign. Biopsied.                           - Normal stomach otherwise                           - Normal duodenal bulb and second portion of the                            duodenum. Recommendation:           - Patient has a contact number available for                            emergencies. The signs and symptoms of potential                            delayed complications were discussed with the                            patient. Return to normal activities tomorrow.                            Written discharge instructions were provided to the                            patient.                           - Resume previous diet.                           - Continue present medications.                           - Await pathology results with further                            recommendations. Viviann Spare P. Irja Wheless, MD 07/05/2021 10:47:36 AM This report has been signed electronically.
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
## 2021-07-07 ENCOUNTER — Telehealth: Payer: Self-pay | Admitting: *Deleted

## 2021-07-07 ENCOUNTER — Telehealth: Payer: Self-pay

## 2021-07-07 NOTE — Telephone Encounter (Signed)
First attempt follow up call to pt, no answer. 

## 2021-07-07 NOTE — Telephone Encounter (Signed)
  Follow up Call-  Call back number 07/05/2021  Post procedure Call Back phone  # [phone]  Permission to leave phone message Yes  Some recent data might be hidden     Patient questions:  Do you have a fever, pain , or abdominal swelling? No. Pain Score  0 *  Have you tolerated food without any problems? Yes.    Have you been able to return to your normal activities? Yes.    Do you have any questions about your discharge instructions: Diet   No. Medications  No. Follow up visit  No.  Do you have questions or concerns about your Care? No.  Actions: * If pain score is 4 or above: No action needed, pain <4.   Have you developed a fever since your procedure? no  2.   Have you had an respiratory symptoms (SOB or cough) since your procedure? no  3.   Have you tested positive for COVID 19 since your procedure no  4.   Have you had any family members/close contacts diagnosed with the COVID 19 since your procedure?  no   If yes to any of these questions please route to Laverna Peace, RN and Karlton Lemon, RN

## 2021-07-08 ENCOUNTER — Encounter: Payer: Self-pay | Admitting: Gastroenterology

## 2021-07-20 ENCOUNTER — Other Ambulatory Visit: Payer: Medicare Other

## 2021-07-20 ENCOUNTER — Other Ambulatory Visit: Payer: Self-pay

## 2021-07-20 DIAGNOSIS — E785 Hyperlipidemia, unspecified: Secondary | ICD-10-CM | POA: Diagnosis not present

## 2021-07-20 LAB — LIPID PANEL
Chol/HDL Ratio: 4.3 ratio (ref 0.0–4.4)
Cholesterol, Total: 225 mg/dL — ABNORMAL HIGH (ref 100–199)
HDL: 52 mg/dL (ref >39)
LDL Chol Calc (NIH): 161 mg/dL — ABNORMAL HIGH (ref 0–99)
Triglycerides: 67 mg/dL (ref 0–149)
VLDL Cholesterol Cal: 12 mg/dL (ref 5–40)

## 2021-07-23 ENCOUNTER — Other Ambulatory Visit: Payer: Self-pay | Admitting: Interventional Cardiology

## 2021-07-23 DIAGNOSIS — I493 Ventricular premature depolarization: Secondary | ICD-10-CM

## 2021-07-25 ENCOUNTER — Telehealth: Payer: Self-pay | Admitting: Pharmacist

## 2021-07-25 MED ORDER — ROSUVASTATIN CALCIUM 5 MG PO TABS: 5.0000 mg | ORAL_TABLET | ORAL | 3 refills | Status: DC

## 2021-07-25 NOTE — Telephone Encounter (Signed)
Called and discussed labs with patient. She is doing well on rosuvastatin 3 times a week. She is agreeable to increasing to 5mg  every other day.  Patient has a long list on intolerances. She cannot afford Leqvio. Nexlizet caused throat closure.  "intolerant to statins - pravastatin 20mg  daily, atorvastatin, simvastatin, and rosuvastatin (myalgias), Repatha (itching), cholestyramine, and niacin. Her insurance plan does not cover Leqvio well and she doesn't qualify for the ORION-4 trial.

## 2021-08-18 ENCOUNTER — Other Ambulatory Visit: Payer: Self-pay | Admitting: Interventional Cardiology

## 2021-08-18 DIAGNOSIS — I493 Ventricular premature depolarization: Secondary | ICD-10-CM

## 2021-08-18 DIAGNOSIS — E876 Hypokalemia: Secondary | ICD-10-CM

## 2021-08-30 DIAGNOSIS — H353131 Nonexudative age-related macular degeneration, bilateral, early dry stage: Secondary | ICD-10-CM | POA: Diagnosis not present

## 2021-08-30 DIAGNOSIS — H1045 Other chronic allergic conjunctivitis: Secondary | ICD-10-CM | POA: Diagnosis not present

## 2021-08-30 DIAGNOSIS — H04123 Dry eye syndrome of bilateral lacrimal glands: Secondary | ICD-10-CM | POA: Diagnosis not present

## 2021-08-30 DIAGNOSIS — Z961 Presence of intraocular lens: Secondary | ICD-10-CM | POA: Diagnosis not present

## 2021-09-01 ENCOUNTER — Other Ambulatory Visit: Payer: Self-pay | Admitting: Interventional Cardiology

## 2021-09-01 DIAGNOSIS — I493 Ventricular premature depolarization: Secondary | ICD-10-CM

## 2021-09-11 ENCOUNTER — Other Ambulatory Visit: Payer: Self-pay | Admitting: Interventional Cardiology

## 2021-09-11 DIAGNOSIS — I493 Ventricular premature depolarization: Secondary | ICD-10-CM

## 2021-09-22 ENCOUNTER — Other Ambulatory Visit: Payer: Self-pay | Admitting: Interventional Cardiology

## 2021-09-22 DIAGNOSIS — I493 Ventricular premature depolarization: Secondary | ICD-10-CM

## 2021-09-22 DIAGNOSIS — E876 Hypokalemia: Secondary | ICD-10-CM

## 2021-10-03 ENCOUNTER — Ambulatory Visit: Payer: Medicare Other

## 2021-10-04 ENCOUNTER — Encounter: Payer: Self-pay | Admitting: Gastroenterology

## 2021-10-05 ENCOUNTER — Ambulatory Visit (INDEPENDENT_AMBULATORY_CARE_PROVIDER_SITE_OTHER): Payer: Medicare Other | Admitting: Family Medicine

## 2021-10-05 ENCOUNTER — Encounter: Payer: Self-pay | Admitting: Family Medicine

## 2021-10-05 ENCOUNTER — Other Ambulatory Visit: Payer: Self-pay

## 2021-10-05 VITALS — BP 110/70 | HR 76 | Ht 61.0 in | Wt 160.0 lb

## 2021-10-05 DIAGNOSIS — I700 Atherosclerosis of aorta: Secondary | ICD-10-CM | POA: Diagnosis not present

## 2021-10-05 DIAGNOSIS — I10 Essential (primary) hypertension: Secondary | ICD-10-CM | POA: Diagnosis not present

## 2021-10-05 DIAGNOSIS — L03031 Cellulitis of right toe: Secondary | ICD-10-CM | POA: Diagnosis not present

## 2021-10-05 DIAGNOSIS — L03032 Cellulitis of left toe: Secondary | ICD-10-CM | POA: Diagnosis not present

## 2021-10-05 MED ORDER — CLOTRIMAZOLE-BETAMETHASONE 1-0.05 % EX CREA: 1.0000 "application " | TOPICAL_CREAM | Freq: Two times a day (BID) | CUTANEOUS | 0 refills | Status: DC

## 2021-10-05 NOTE — Patient Instructions (Signed)
Please apply the cream once a day to the itchy rash on your second toe.  Keep apply it until it goes away.  It may flare up occasionally.  Just use the cream again when it does.  You may call for refills of the cream if you run out.   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

## 2021-10-06 ENCOUNTER — Encounter: Payer: Self-pay | Admitting: Family Medicine

## 2021-10-06 DIAGNOSIS — L03032 Cellulitis of left toe: Secondary | ICD-10-CM | POA: Insufficient documentation

## 2021-10-06 DIAGNOSIS — L03031 Cellulitis of right toe: Secondary | ICD-10-CM | POA: Insufficient documentation

## 2021-10-06 HISTORY — DX: Cellulitis of right toe: L03.031

## 2021-10-06 HISTORY — DX: Cellulitis of left toe: L03.032

## 2021-10-06 NOTE — Assessment & Plan Note (Signed)
New problem Present for about two weeks, itching rash on dorsums of both second toes at proximal nail folds See photos  A/ Chronic paronychia Recommend stopping manipulation of nail cuticles Rx Lotrisone crm daily with flares.

## 2021-10-06 NOTE — Assessment & Plan Note (Signed)
See paronychia of second toe of left foot A/P

## 2021-10-06 NOTE — Progress Notes (Signed)
[name] is alone for visit  Sources of clinical information for visit is/are patient. Nursing assessment for this office visit was reviewed with the patient for accuracy and revision.     Previous Report(s) Reviewed: Reports from GI and Cardiology-Pharmacy consultations  Depression screen [facility] 2/9 10/05/2021  Decreased Interest 1  Down, Depressed, Hopeless 1  PHQ - 2 Score 2  Altered sleeping 1  Tired, decreased energy 2  Change in appetite 2  Feeling bad or failure about yourself  1  Trouble concentrating 2  Moving slowly or fidgety/restless 0  Suicidal thoughts 0  PHQ-9 Score 10  Difficult doing work/chores Not difficult at all  Some recent data might be hidden   [employer] Visit from 10/05/2021 in [facility] Office Visit from 02/02/2021 in [facility] Office Visit from 09/01/2020 in [facility]  Thoughts that you would be better off dead, or of hurting yourself in some way Not at all Not at all Not at all  PHQ-9 Total Score 10 15 2        Fall Risk  10/05/2021 02/02/2021 01/06/2020 06/11/2019 10/02/2018  Falls in the past year? 0 1 0 0 0  Number falls in past yr: 0 0 - - -  Comment - - - - -  Injury with Fall? 0 1 - - -  Comment - - - - -  Risk Factor Category  - - - - -  Risk for fall due to : - - - - -  Risk for fall due to: Comment - - - - -  Follow up - - - - -    PHQ9 SCORE ONLY 10/05/2021 02/02/2021 09/01/2020  PHQ-9 Total Score 10 15 2     Adult vaccines due  Topic Date Due   TETANUS/TDAP  09/08/2030    Health Maintenance Due  Topic Date Due   Zoster Vaccines- Shingrix (1 of 2) Never done      History/P.E. limitations: none  Adult vaccines due  Topic Date Due   TETANUS/TDAP  09/08/2030   There are no preventive care reminders to display for this patient.  Health Maintenance Due  Topic Date Due   Zoster Vaccines- Shingrix (1 of 2) Never done     Chief Complaint  Patient  presents with   toe swollen

## 2021-10-06 NOTE — Assessment & Plan Note (Signed)
Established problem Well Controlled. No signs of complications, medication side effects, or red flags. Continue current medications and other regiments.  

## 2021-10-06 NOTE — Assessment & Plan Note (Signed)
Established problem Well Controlled. No symptoms of CAD or PAD.  BP controlled and she is working with [employer] using Crestor to improve her cholesterol

## 2021-10-10 ENCOUNTER — Telehealth: Payer: Self-pay | Admitting: *Deleted

## 2021-10-10 DIAGNOSIS — Z91010 Allergy to peanuts: Secondary | ICD-10-CM

## 2021-10-10 NOTE — Telephone Encounter (Signed)
Patient would like a referral to the allergy doctor to discuss her peanut allergy. She states that her and Dr. McDiarmid discussed this already.  Will forward to MD.  [name]

## 2021-10-10 NOTE — Telephone Encounter (Signed)
Please let [name] know I just sent in the referral to the Allergist. I apologize for the delay.

## 2021-10-13 NOTE — Telephone Encounter (Signed)
Spoke with patient informed of note below. Patient understood. [name], CMA  

## 2021-10-15 ENCOUNTER — Other Ambulatory Visit: Payer: Self-pay | Admitting: Interventional Cardiology

## 2021-10-15 DIAGNOSIS — E876 Hypokalemia: Secondary | ICD-10-CM

## 2021-10-28 ENCOUNTER — Other Ambulatory Visit: Payer: Self-pay | Admitting: Interventional Cardiology

## 2021-10-28 DIAGNOSIS — E876 Hypokalemia: Secondary | ICD-10-CM

## 2021-10-30 MED ORDER — POTASSIUM CHLORIDE CRYS ER 20 MEQ PO TBCR: EXTENDED_RELEASE_TABLET | ORAL | 3 refills | Status: DC

## 2021-11-05 ENCOUNTER — Other Ambulatory Visit: Payer: Self-pay | Admitting: Gastroenterology

## 2021-11-15 ENCOUNTER — Other Ambulatory Visit: Payer: Self-pay | Admitting: Interventional Cardiology

## 2021-11-15 DIAGNOSIS — E876 Hypokalemia: Secondary | ICD-10-CM

## 2021-11-15 DIAGNOSIS — I493 Ventricular premature depolarization: Secondary | ICD-10-CM

## 2021-11-20 ENCOUNTER — Encounter: Payer: Self-pay | Admitting: Gastroenterology

## 2021-11-20 ENCOUNTER — Ambulatory Visit: Payer: Medicare Other | Admitting: Gastroenterology

## 2021-11-20 VITALS — BP 124/60 | HR 72 | Ht 61.5 in | Wt 158.5 lb

## 2021-11-20 DIAGNOSIS — K52831 Collagenous colitis: Secondary | ICD-10-CM

## 2021-11-20 DIAGNOSIS — K317 Polyp of stomach and duodenum: Secondary | ICD-10-CM

## 2021-11-20 DIAGNOSIS — R1013 Epigastric pain: Secondary | ICD-10-CM

## 2021-11-20 MED ORDER — PEPTO-BISMOL 524 MG/30ML PO SUSP: 30.0000 mL | Freq: Three times a day (TID) | ORAL | 0 refills | Status: AC

## 2021-11-20 MED ORDER — FAMOTIDINE 20 MG PO TABS: ORAL_TABLET | ORAL | 3 refills | Status: DC

## 2021-11-20 MED ORDER — DICYCLOMINE HCL 10 MG PO CAPS: 10.0000 mg | ORAL_CAPSULE | Freq: Three times a day (TID) | ORAL | 1 refills | Status: DC | PRN

## 2021-11-20 NOTE — Patient Instructions (Addendum)
If you are age 77 or older, your body mass index should be between 23-30. Your Body mass index is 29.46 kg/m?Marland Kitchen If this is out of the aforementioned range listed, please consider follow up with your Primary Care Provider. ? ?If you are age 56 or younger, your body mass index should be between 19-25. Your Body mass index is 29.46 kg/m?Marland Kitchen If this is out of the aformentioned range listed, please consider follow up with your Primary Care Provider.  ? ?________________________________________________________ ? ?The [facility] providers would like to encourage you to use [facility] to communicate with providers for non-urgent requests or questions.  Due to long hold times on the telephone, sending your provider a message by [facility] may be a faster and more efficient way to get a response.  Please allow 48 business hours for a response.  Please remember that this is for non-urgent requests.  ?_______________________________________________________ ? ?We have sent the following medications to your pharmacy for you to pick up at your convenience: ?Pepcid 20 mg: Take once to twice a day as needed ?Bentyl 10 mg: Take every 8 hours as needed ? ?Please purchase the following medications over the counter and take as directed: ?Bismuth (Pepto bismol) tablets: Take three times a day ? ?Imodium: Take as needed ? ?Please follow up in 3 to 4 months. ? ?Thank you for entrusting me with your care and for choosing [facility], ?Dr. Ileene Patrick ? ? ? ? ? ?
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
## 2021-11-30 ENCOUNTER — Other Ambulatory Visit: Payer: Self-pay | Admitting: Family Medicine

## 2021-11-30 ENCOUNTER — Other Ambulatory Visit: Payer: Self-pay | Admitting: Interventional Cardiology

## 2021-12-04 ENCOUNTER — Other Ambulatory Visit: Payer: Self-pay | Admitting: Family Medicine

## 2021-12-04 DIAGNOSIS — Z1231 Encounter for screening mammogram for malignant neoplasm of breast: Secondary | ICD-10-CM

## 2021-12-07 ENCOUNTER — Ambulatory Visit: Payer: Medicare Other | Admitting: Allergy

## 2021-12-07 ENCOUNTER — Encounter: Payer: Self-pay | Admitting: Allergy

## 2021-12-07 ENCOUNTER — Other Ambulatory Visit: Payer: Self-pay

## 2021-12-07 VITALS — BP 116/62 | HR 69 | Temp 97.7°F | Resp 16 | Ht 61.0 in | Wt 161.2 lb

## 2021-12-07 DIAGNOSIS — T781XXA Other adverse food reactions, not elsewhere classified, initial encounter: Secondary | ICD-10-CM

## 2021-12-07 DIAGNOSIS — J4520 Mild intermittent asthma, uncomplicated: Secondary | ICD-10-CM | POA: Insufficient documentation

## 2021-12-07 DIAGNOSIS — T7800XA Anaphylactic reaction due to unspecified food, initial encounter: Secondary | ICD-10-CM | POA: Diagnosis not present

## 2021-12-07 DIAGNOSIS — J45909 Unspecified asthma, uncomplicated: Secondary | ICD-10-CM | POA: Insufficient documentation

## 2021-12-07 MED ORDER — EPINEPHRINE 0.3 MG/0.3ML IJ SOAJ: 0.3000 mg | INTRAMUSCULAR | 1 refills | Status: AC | PRN

## 2021-12-07 NOTE — Patient Instructions (Signed)
Food reactions ?   - skin testing is slightly positive to scallops.  negative to peanut, shrimp, lobster, crab, oyster ?   - will obtain serum IgE levels to shellfish and peanut and if low or negative you would be eligible for in-office challenge to shellfish and/or peanut to determine if you are not allergic to these foods ?   - continue avoidance of peanut and shellfish for now ?   - have access to self-injectable epinephrine Epipen 0.3mg  at all times ?   - follow emergency action plan in case of allergic reaction ? ?Follow-up in 1 year or sooner if able to perform food challenges ? ?
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
## 2021-12-09 LAB — IGE PEANUT W/COMPONENT REFLEX: Peanut, IgE: <0.1 kU/L

## 2021-12-09 LAB — ALLERGEN PROFILE, SHELLFISH
Clam IgE: <0.1 kU/L
F023-IgE Crab: <0.1 kU/L
F080-IgE Lobster: <0.1 kU/L
F290-IgE Oyster: <0.1 kU/L
Scallop IgE: <0.1 kU/L
Shrimp IgE: <0.1 kU/L

## 2021-12-22 ENCOUNTER — Other Ambulatory Visit: Payer: Self-pay | Admitting: Gastroenterology

## 2021-12-28 ENCOUNTER — Other Ambulatory Visit: Payer: Self-pay | Admitting: Gastroenterology

## 2022-01-08 ENCOUNTER — Ambulatory Visit
Admission: RE | Admit: 2022-01-08 | Discharge: 2022-01-08 | Disposition: A | Discharge status: Home / Self Care | Payer: Medicare Other | Source: Ambulatory Visit | Attending: Family Medicine | Admitting: Family Medicine

## 2022-01-08 DIAGNOSIS — Z1231 Encounter for screening mammogram for malignant neoplasm of breast: Secondary | ICD-10-CM | POA: Diagnosis not present

## 2022-01-10 ENCOUNTER — Other Ambulatory Visit: Payer: Self-pay | Admitting: Family Medicine

## 2022-01-10 DIAGNOSIS — R928 Other abnormal and inconclusive findings on diagnostic imaging of breast: Secondary | ICD-10-CM

## 2022-01-16 ENCOUNTER — Ambulatory Visit
Admission: RE | Admit: 2022-01-16 | Discharge: 2022-01-16 | Disposition: A | Discharge status: Home / Self Care | Payer: Medicare Other | Source: Ambulatory Visit | Attending: Family Medicine | Admitting: Family Medicine

## 2022-01-16 ENCOUNTER — Ambulatory Visit: Payer: Medicare Other

## 2022-01-16 DIAGNOSIS — N6489 Other specified disorders of breast: Secondary | ICD-10-CM | POA: Diagnosis not present

## 2022-01-16 DIAGNOSIS — R928 Other abnormal and inconclusive findings on diagnostic imaging of breast: Secondary | ICD-10-CM
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
## 2022-01-31 ENCOUNTER — Ambulatory Visit: Payer: Medicare Other | Admitting: Interventional Cardiology

## 2022-01-31 DIAGNOSIS — I493 Ventricular premature depolarization: Secondary | ICD-10-CM

## 2022-01-31 DIAGNOSIS — I10 Essential (primary) hypertension: Secondary | ICD-10-CM

## 2022-01-31 DIAGNOSIS — I2510 Atherosclerotic heart disease of native coronary artery without angina pectoris: Secondary | ICD-10-CM

## 2022-01-31 DIAGNOSIS — E7800 Pure hypercholesterolemia, unspecified: Secondary | ICD-10-CM

## 2022-01-31 DIAGNOSIS — G3184 Mild cognitive impairment, so stated: Secondary | ICD-10-CM

## 2022-02-14 ENCOUNTER — Encounter: Payer: Self-pay | Admitting: Allergy

## 2022-02-14 ENCOUNTER — Ambulatory Visit (INDEPENDENT_AMBULATORY_CARE_PROVIDER_SITE_OTHER): Payer: Medicare Other | Admitting: Allergy

## 2022-02-14 VITALS — BP 112/74 | HR 69 | Temp 97.8°F | Resp 18 | Ht 61.0 in | Wt 158.1 lb

## 2022-02-14 DIAGNOSIS — T781XXA Other adverse food reactions, not elsewhere classified, initial encounter: Secondary | ICD-10-CM | POA: Diagnosis not present

## 2022-02-14 DIAGNOSIS — T781XXD Other adverse food reactions, not elsewhere classified, subsequent encounter: Secondary | ICD-10-CM

## 2022-02-14 NOTE — Progress Notes (Signed)
Follow-up Note  RE: [name] MRN: [id number] DOB: [birth year] Date of Office Visit: 02/14/2022   History of present illness: [name] is a 76 y.o. female presenting today for food challenge to peanut.  She has history of adverse reactions.  She has been avoiding peanuts and shellfish in her diet.  She was last seen in the office on 12/07/2021 by myself. She is in her usual state of health today but does report she is a bit itchy which is not unusual for her.  She has not had any antihistamines in the past 3 days or so for this challenge today.   She reports she is not allergic to peanuts and she would like to eat peanut products in her diet like chocolate covered peanuts.    Skin testing to peanut at her last visit was negative.  Serum IgE testing to peanut was negative.  Review of systems: Review of Systems  Constitutional: Negative.   HENT: Negative.    Eyes: Negative.   Respiratory: Negative.    Cardiovascular: Negative.   Gastrointestinal: Negative.   Musculoskeletal: Negative.   Skin:  Negative for rash.       See HPI  Allergic/Immunologic: Negative.   Neurological: Negative.     All other systems negative unless noted above in HPI  Past medical/social/surgical/family history have been reviewed and are unchanged unless specifically indicated below.  No changes  Medication List: Current Outpatient Medications  Medication Sig Dispense Refill   acetaminophen (TYLENOL) 500 MG tablet Take 1 tablet (500 mg total) by mouth every 6 (six) hours as needed. 30 tablet 0   aspirin 81 MG tablet Take 1 tablet (81 mg total) by mouth daily. 30 tablet    atenolol (TENORMIN) 50 MG tablet TAKE 1 TABLET BY MOUTH EVERY DAY 90 tablet 0   bismuth subsalicylate (PEPTO-BISMOL) 262 MG/15ML suspension Take 30 mLs by mouth in the morning, at noon, and at bedtime. 30 mL 0   CALCIUM-VITAMIN D PO Take 1 tablet by mouth every other day.     clotrimazole-betamethasone (LOTRISONE) cream APPLY  TO AFFECTED AREA TWICE A DAY 30 g 0   Coenzyme Q10 (COQ-10) 50 MG CAPS Take 1 capsule by mouth 2 (two) times daily. (with Grape Seed Complex)     Cyanocobalamin (B-12) 1000 MCG SUBL PLACE 1 TABLET (1,000 MCG TOTAL) UNDER THE TONGUE DAILY. 90 tablet 1   dicyclomine (BENTYL) 10 MG capsule TAKE 1 CAPSULE (10 MG TOTAL) BY MOUTH EVERY 8 (EIGHT) HOURS AS NEEDED FOR SPASMS. 90 capsule 1   EPINEPHrine 0.3 mg/0.3 mL IJ SOAJ injection Inject 0.3 mg into the muscle as needed for anaphylaxis. 1 each 1   famotidine (PEPCID) 20 MG tablet Take once tablet once to twice daily as needed 60 tablet 3   GARLIC PO Take 1 capsule by mouth daily.     loperamide (IMODIUM A-D) 2 MG capsule Take 1-2 capsules (2-4 mg total) by mouth daily as needed for diarrhea or loose stools. 30 capsule 0   Multiple Vitamin (MULTIVITAMIN PO) Take by mouth 3 (three) times a week.     Omega-3 Fatty Acids (FISH OIL PO) Take 1 capsule by mouth 2 (two) times daily.     potassium chloride SA (KLOR-CON M20) 20 MEQ tablet TAKE TWO (2) TABLETS EACH MORNING AND 1 TABLET EACH EVENING. 270 tablet 0   PROAIR HFA 108 (90 Base) MCG/ACT inhaler TAKE 2 PUFFS BY MOUTH EVERY 6 HOURS AS NEEDED FOR WHEEZE OR SHORTNESS  OF BREATH 8.5 each 2   rosuvastatin (CRESTOR) 5 MG tablet Take 1 tablet (5 mg total) by mouth every other day. 45 tablet 3   spironolactone (ALDACTONE) 25 MG tablet TAKE 1 TABLET (25 MG TOTAL) BY MOUTH DAILY. 90 tablet 0   SYSTANE ULTRA 0.4-0.3 % SOLN Apply to eye as needed.     UNABLE TO FIND Take 700 mg by mouth in the morning and at bedtime. Apple Pectin  caps     No current facility-administered medications for this visit.     Known medication allergies: Allergies  Allergen Reactions   Cefuroxime Rash    Has patient had a PCN reaction causing immediate rash, facial/tongue/throat swelling, SOB or lightheadedness with hypotension: Yes Has patient had a PCN reaction causing severe rash involving mucus membranes or skin necrosis: No Has  patient had a PCN reaction that required hospitalization Yes Has patient had a PCN reaction occurring within the last 10 years: Yes If all of the above answers are "NO", then may proceed with Cephalosporin use.    Nexlizet [Bempedoic Acid-Ezetimibe] Anaphylaxis    Throat swelling   Azithromycin Itching   Contrast Media [Iodinated Contrast Media] Itching   Oxycodone Itching   Peanut (Diagnostic)     Throat closing   Phenergan [Promethazine Hcl] Nausea And Vomiting    Could retry if other antiemetics fail   Repatha [Evolocumab] Itching   Shellfish Allergy Swelling    Throat swell   Statins Other (See Comments)    Myalgias on pravastatin, atorvastatin, and rosuvastatin   Amoxicillin Rash    headache   Tramadol Rash and Hives     Physical examination: Blood pressure 112/74, pulse 69, temperature 97.8 F (36.6 C), resp. rate 18, height 5\' 1"  (1.549 m), weight 158 lb 2 oz (71.7 kg), SpO2 99 %.  General: Alert, interactive, in no acute distress. HEENT: PERRLA, TMs pearly gray, turbinates non-edematous without discharge, post-pharynx non erythematous. Neck: Supple without lymphadenopathy. Lungs: Clear to auscultation without wheezing, rhonchi or rales. {no increased work of breathing. CV: Normal S1, S2 without murmurs. Abdomen: Nondistended, nontender. Skin: Warm and dry, without lesions or rashes. Extremities:  No clubbing, cyanosis or edema. Neuro:   Grossly intact.  Diagnositics/Labs: Labs: Component     Latest Ref Rng 12/07/2021  Clam IgE     Class 0 kU/L <0.10   F023-IgE Crab     Class 0 kU/L <0.10   Shrimp IgE     Class 0 kU/L <0.10   Scallop IgE     Class 0 kU/L <0.10   F290-IgE Oyster     Class 0 kU/L <0.10   F080-IgE Lobster     Class 0 kU/L <0.10   Peanut, IgE     Class 0 kU/L <0.10    Food challenge to peanut    with use of    peanut butter   .  Benefits and risks of challenge discussed and consent obtained.  She was provided with increasing doses of peanut  butter every 15 minutes and consumed total of 31 g.  She was observed for additional hour after completion of ingestion challenge.  She had no signs/symptoms of allergic reaction.  Vitals were obtained prior to discharge and remained stable.     Assessment and plan: Adverse Food reactions    - skin testing is slightly positive to scallops.  negative to peanut, shrimp, lobster, crab, oyster    - serum IgE levels to shellfish and peanut are negative     -  peanut in-office challenge performed today and successfully passed!  Thus you are deemed not peanut allergic and can put peanut products back into your diet.  Do not eat any more peanut products today but tomorrow moving forward you can eat peanut products freely.      - continue avoidance of shellfish for now.  You are eligible for in-office challenge to shellfish.      - have access to self-injectable epinephrine Epipen 0.3mg  at all times    - follow emergency action plan in case of allergic reaction  Follow-up in 1 year or sooner to perform food challenges   No follow-ups on file.  I appreciate the opportunity to take part in Shaun's care. Please do not hesitate to contact me with questions.  Sincerely,   Margo AyeShaylar Anjelica Gorniak, MD Allergy/Immunology [facility]

## 2022-02-14 NOTE — Patient Instructions (Addendum)
Food reactions    - skin testing is slightly positive to scallops.  negative to peanut, shrimp, lobster, crab, oyster    - serum IgE levels to shellfish and peanut are negative     - peanut in-office challenge performed today and successfully passed!  Thus you are deemed not peanut allergic and can put peanut products back into your diet.  Do not eat any more peanut products today but tomorrow moving forward you can eat peanut products freely.      - continue avoidance of shellfish for now.  You are eligible for in-office challenge to shellfish.      - have access to self-injectable epinephrine Epipen 0.3mg  at all times    - follow emergency action plan in case of allergic reaction  Follow-up in 1 year or sooner to perform food challenges

## 2022-04-04 ENCOUNTER — Other Ambulatory Visit: Payer: Self-pay | Admitting: Interventional Cardiology

## 2022-04-04 DIAGNOSIS — E876 Hypokalemia: Secondary | ICD-10-CM

## 2022-04-04 MED ORDER — POTASSIUM CHLORIDE CRYS ER 20 MEQ PO TBCR: EXTENDED_RELEASE_TABLET | ORAL | 0 refills | Status: DC
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
## 2022-04-09 ENCOUNTER — Ambulatory Visit (INDEPENDENT_AMBULATORY_CARE_PROVIDER_SITE_OTHER): Payer: Medicare Other

## 2022-04-09 ENCOUNTER — Encounter: Payer: Self-pay | Admitting: Gastroenterology

## 2022-04-09 ENCOUNTER — Encounter: Payer: Self-pay | Admitting: Interventional Cardiology

## 2022-04-09 ENCOUNTER — Ambulatory Visit: Payer: Medicare Other | Admitting: Interventional Cardiology

## 2022-04-09 VITALS — BP 110/74 | HR 69 | Ht 61.0 in | Wt 157.2 lb

## 2022-04-09 DIAGNOSIS — E785 Hyperlipidemia, unspecified: Secondary | ICD-10-CM

## 2022-04-09 DIAGNOSIS — T466X5A Adverse effect of antihyperlipidemic and antiarteriosclerotic drugs, initial encounter: Secondary | ICD-10-CM

## 2022-04-09 DIAGNOSIS — T466X5D Adverse effect of antihyperlipidemic and antiarteriosclerotic drugs, subsequent encounter: Secondary | ICD-10-CM

## 2022-04-09 DIAGNOSIS — I493 Ventricular premature depolarization: Secondary | ICD-10-CM

## 2022-04-09 DIAGNOSIS — I2510 Atherosclerotic heart disease of native coronary artery without angina pectoris: Secondary | ICD-10-CM

## 2022-04-09 DIAGNOSIS — E7800 Pure hypercholesterolemia, unspecified: Secondary | ICD-10-CM

## 2022-04-09 DIAGNOSIS — I10 Essential (primary) hypertension: Secondary | ICD-10-CM

## 2022-04-09 DIAGNOSIS — G720 Drug-induced myopathy: Secondary | ICD-10-CM

## 2022-04-09 NOTE — Progress Notes (Unsigned)
Enrolled for [employer] to mail a ZIO XT long term holter monitor to the patients address on file.  

## 2022-04-09 NOTE — Patient Instructions (Signed)
Medication Instructions:  Your physician recommends that you continue on your current medications as directed. Please refer to the Current Medication list given to you today.  Stop taking Rosuvastatin (Crestor) for 3 weeks. Call us in about 3 weeks to let us know if your pain has improved being off of the Rosuvastatin. Our phone number is [phone].  *If you need a refill on your cardiac medications before your next appointment, please call your pharmacy*  Lab Work: NONE  Testing/Procedures: Your physician recommends you have a coronary calcium score performed.  Your physician recommends you wear a Zio heart monitor for 2 weeks. This will be mailed to your home with instructions on how to apply and mail it back when finished. If you have any trouble applying the heart monitor please call our office at [phone].  Follow-Up: At [facility], you and your health needs are our priority.  As part of our continuing mission to provide you with exceptional heart care, we have created designated Provider Care Teams.  These Care Teams include your primary Cardiologist (physician) and Advanced Practice Providers (APPs -  Physician Assistants and Nurse Practitioners) who all work together to provide you with the care you need, when you need it.  Your next appointment:   1 year(s)  The format for your next appointment:   In Person  Provider:   Lesleigh Noe, MD {  (Dr. Katrinka Blazing recommends following up with Dr. Thomasene Ripple when he retires. Ask for her when it comes time to schedule your follow-up appointment in July 2024.)  Other Instructions ZIO XT- Long Term Monitor Instructions  Your physician has requested you wear a ZIO patch monitor for 14 days.  This is a single patch monitor. [employer] supplies one patch monitor per enrollment. Additional stickers are not available. Please do not apply patch if you will be having a Nuclear Stress Test,  Echocardiogram, Cardiac CT, MRI, or Chest  Xray during the period you would be wearing the  monitor. The patch cannot be worn during these tests. You cannot remove and re-apply the  ZIO XT patch monitor.  Your ZIO patch monitor will be mailed 3 day USPS to your address on file. It may take 3-5 days  to receive your monitor after you have been enrolled.  Once you have received your monitor, please review the enclosed instructions. Your monitor  has already been registered assigning a specific monitor serial # to you.  Billing and Patient Assistance Program Information  We have supplied [employer] with any of your insurance information on file for billing purposes. [employer] offers a sliding scale Patient Assistance Program for patients that do not have  insurance, or whose insurance does not completely cover the cost of the ZIO monitor.  You must apply for the Patient Assistance Program to qualify for this discounted rate.  To apply, please call [employer] at [phone], select option 4, select option 2, ask to apply for  Patient Assistance Program. Meredeth Ide will ask your household income, and how many people  are in your household. They will quote your out-of-pocket cost based on that information.  [employer] will also be able to set up a 78-month, interest-free payment plan if needed.  Applying the monitor   Shave hair from upper left chest.  Hold abrader disc by orange tab. Rub abrader in 40 strokes over the upper left chest as  indicated in your monitor instructions.  Clean area with 4 enclosed alcohol pads. Let dry.  Apply patch as  indicated in monitor instructions. Patch will be placed under collarbone on left  side of chest with arrow pointing upward.  Rub patch adhesive wings for 2 minutes. Remove white label marked "1". Remove the white  label marked "2". Rub patch adhesive wings for 2 additional minutes.  While looking in a mirror, press and release button in center of patch. A small green light will  flash 3-4 times. This will  be your only indicator that the monitor has been turned on.  Do not shower for the first 24 hours. You may shower after the first 24 hours.  Press the button if you feel a symptom. You will hear a small click. Record Date, Time and  Symptom in the Patient Logbook.  When you are ready to remove the patch, follow instructions on the last 2 pages of Patient  Logbook. Stick patch monitor onto the last page of Patient Logbook.  Place Patient Logbook in the blue and white box. Use locking tab on box and tape box closed  securely. The blue and white box has prepaid postage on it. Please place it in the mailbox as  soon as possible. Your physician should have your test results approximately 7 days after the  monitor has been mailed back to [facility].  Call [facility] Customer Care at [phone] if you have questions regarding  your ZIO XT patch monitor. Call them immediately if you see an orange light blinking on your  monitor.  If your monitor falls off in less than 4 days, contact our Monitor department at [phone].  If your monitor becomes loose or falls off after 4 days call [employer] at [phone] for  suggestions on securing your monitor   Important Information About Sugar

## 2022-04-12 DIAGNOSIS — I493 Ventricular premature depolarization: Secondary | ICD-10-CM

## 2022-04-22 ENCOUNTER — Other Ambulatory Visit: Payer: Self-pay | Admitting: Interventional Cardiology

## 2022-04-22 DIAGNOSIS — I493 Ventricular premature depolarization: Secondary | ICD-10-CM

## 2022-04-23 ENCOUNTER — Other Ambulatory Visit: Payer: Self-pay | Admitting: Interventional Cardiology

## 2022-04-23 DIAGNOSIS — E876 Hypokalemia: Secondary | ICD-10-CM

## 2022-04-25 ENCOUNTER — Ambulatory Visit (HOSPITAL_BASED_OUTPATIENT_CLINIC_OR_DEPARTMENT_OTHER)
Admission: RE | Admit: 2022-04-25 | Discharge: 2022-04-25 | Disposition: A | Discharge status: Home / Self Care | Payer: Medicare Other | Source: Ambulatory Visit | Attending: Interventional Cardiology | Admitting: Interventional Cardiology

## 2022-04-25 DIAGNOSIS — I2510 Atherosclerotic heart disease of native coronary artery without angina pectoris: Secondary | ICD-10-CM | POA: Insufficient documentation

## 2022-05-01 DIAGNOSIS — M5451 Vertebrogenic low back pain: Secondary | ICD-10-CM | POA: Diagnosis not present

## 2022-05-04 ENCOUNTER — Telehealth: Payer: Self-pay

## 2022-05-04 DIAGNOSIS — E785 Hyperlipidemia, unspecified: Secondary | ICD-10-CM

## 2022-05-04 NOTE — Telephone Encounter (Signed)
-----   Message from Lyn Records, MD sent at 04/30/2022  6:37 PM EDT ----- Let the patient know the calcium score is abnormal and she is at moderate rissk of an event. Refer to Lipid clinic to consider Leqvio A copy will be sent to Patrcia Dolly, DO

## 2022-05-04 NOTE — Telephone Encounter (Signed)
Discussed results of calcium score with patient.  Referral to Lipid Clinic ordered. Scheduler to call and setup appt.

## 2022-05-07 DIAGNOSIS — I493 Ventricular premature depolarization: Secondary | ICD-10-CM | POA: Diagnosis not present

## 2022-05-22 ENCOUNTER — Telehealth: Payer: Self-pay

## 2022-05-22 MED ORDER — METOPROLOL SUCCINATE ER 50 MG PO TB24: 50.0000 mg | ORAL_TABLET | Freq: Every day | ORAL | 3 refills | Status: DC

## 2022-05-22 NOTE — Telephone Encounter (Signed)
Spoke with patient and discussed heart monitor results.  Per Dr. Katrinka Blazing: Let the patient know there is no atrial fibrillation. She does have brief SVT, which is likely causing her symptom. Recommend changing Tenormin to Metoprolol Succinate 50 mg daily. May suppress palpitations better.  Metoprolol succinate 50mg  QD sent to pharmacy of choice.  Patient verbalized understanding of the above, and expressed appreciation for call.

## 2022-05-22 NOTE — Telephone Encounter (Signed)
-----   Message from Lyn Records, MD sent at 05/18/2022  5:50 PM EDT ----- Let the patient know there is no atrial fibrillation. She does have brief SVT, which is likely causing her symptom. Recommend changing Tenormin to Metoprolol Succinate 50 mg daily. May suppress palpitations better. A copy will be sent to Patrcia Dolly, DO

## 2022-06-06 ENCOUNTER — Ambulatory Visit: Payer: Medicare Other

## 2022-06-06 ENCOUNTER — Ambulatory Visit: Payer: Medicare Other | Attending: Cardiology | Admitting: Pharmacist

## 2022-06-06 DIAGNOSIS — E7800 Pure hypercholesterolemia, unspecified: Secondary | ICD-10-CM | POA: Diagnosis not present

## 2022-06-06 DIAGNOSIS — I700 Atherosclerosis of aorta: Secondary | ICD-10-CM
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
## 2022-06-07 ENCOUNTER — Ambulatory Visit (INDEPENDENT_AMBULATORY_CARE_PROVIDER_SITE_OTHER): Payer: Medicare Other | Admitting: Family Medicine

## 2022-06-07 ENCOUNTER — Encounter: Payer: Self-pay | Admitting: Family Medicine

## 2022-06-07 VITALS — BP 122/66 | Ht 61.0 in | Wt 156.1 lb

## 2022-06-07 DIAGNOSIS — I10 Essential (primary) hypertension: Secondary | ICD-10-CM

## 2022-06-07 DIAGNOSIS — F39 Unspecified mood [affective] disorder: Secondary | ICD-10-CM | POA: Diagnosis not present

## 2022-06-07 DIAGNOSIS — I700 Atherosclerosis of aorta: Secondary | ICD-10-CM

## 2022-06-07 DIAGNOSIS — R519 Headache, unspecified: Secondary | ICD-10-CM

## 2022-06-07 DIAGNOSIS — Z23 Encounter for immunization: Secondary | ICD-10-CM | POA: Diagnosis not present

## 2022-06-07 DIAGNOSIS — L853 Xerosis cutis: Secondary | ICD-10-CM

## 2022-06-07 DIAGNOSIS — E663 Overweight: Secondary | ICD-10-CM | POA: Diagnosis not present

## 2022-06-07 DIAGNOSIS — L811 Chloasma: Secondary | ICD-10-CM | POA: Diagnosis not present

## 2022-06-07 DIAGNOSIS — J4520 Mild intermittent asthma, uncomplicated: Secondary | ICD-10-CM

## 2022-06-07 LAB — POCT GLYCOSYLATED HEMOGLOBIN (HGB A1C): Hemoglobin A1C: 4.8 % (ref 4.0–5.6)

## 2022-06-07 NOTE — Patient Instructions (Addendum)
Your blood pressure and weight look good.  Please do not take dicyclomine - it can impair your thinking and balance leading to falls.   Keep your skin mosturized using a cream multiple times a day.  Use 15-SFP sunscreen on face and exposed skin when outside for more than 15 minutes.   When you feel the feeling of insects on your skin, use your moisturizer and rub the area.   If your problem with starches worsens, please let me or Dr Havery Moros know.    We are checking your A1c for diabetes today.

## 2022-06-08 ENCOUNTER — Encounter: Payer: Self-pay | Admitting: Family Medicine

## 2022-06-08 DIAGNOSIS — L811 Chloasma: Secondary | ICD-10-CM

## 2022-06-08 DIAGNOSIS — E663 Overweight: Secondary | ICD-10-CM | POA: Insufficient documentation

## 2022-06-08 DIAGNOSIS — L853 Xerosis cutis: Secondary | ICD-10-CM | POA: Insufficient documentation

## 2022-06-08 HISTORY — DX: Chloasma: L81.1

## 2022-06-08 NOTE — Assessment & Plan Note (Signed)
New concern Chronic facial symmetric patches of darkened over lateral zygomatic and buccal skin No extension over medial zygomatic nor nasal bridge.  Facial Patcheses flat, without borders.  No secondary skin change lesions.  Surrounding skin is normal in appearance  I suspect this is melasma that has darkened further with age No specific recommendations at this time.  I am not certain that it would response to a topical hydroquine. If worsens, topical Azelaic acid could be tried.

## 2022-06-08 NOTE — Assessment & Plan Note (Signed)
Established problem. Adequate blood pressure control.  No evidence of new end organ damage.  Tolerating medication without significant adverse effects.  Plan to continue current blood pressure medication regiment.   

## 2022-06-08 NOTE — Assessment & Plan Note (Signed)
Established problem Well Controlled and is at goal of very limited use of albuterol rescue (less than once every 5-6 months, usually with viral uri) No signs of complications, medication side effects, or red flags. Continue current medications and other regiments.

## 2022-06-08 NOTE — Assessment & Plan Note (Addendum)
Established problem Recent episodes of her usual headache.  Increased stress from family matters. No red flags of IC catastrophic headache or GCA. Normal Neuro exam Able to handle it with relaxation and APAP as needed  Very likely Episodic Tension Type Headache clustering due to socio-emotional stress Reassurance provided Observe for now

## 2022-06-08 NOTE — Assessment & Plan Note (Signed)
Scrrening for DMT2 A1c 4.8 %. Assessment and Plan Low risk diabetes patient reassurance given

## 2022-06-08 NOTE — Assessment & Plan Note (Signed)
Wt Readings from Last 3 Encounters:  06/07/22 156 lb 2 oz (70.8 kg)  04/09/22 157 lb 3.2 oz (71.3 kg)  02/14/22 158 lb 2 oz (71.7 kg)  Stable Discussed dietary measures

## 2022-06-08 NOTE — Assessment & Plan Note (Signed)
Established problem worsened.     06/07/2022   11:11 AM 10/05/2021   10:42 AM 02/02/2021    2:10 PM  PHQ9 SCORE ONLY  PHQ-9 Total Score 17 10 15   Today's Q9 was a 2. Ms [name] about death, and whether she would be better of dead, but she has no intent and no plans.  Ms [name] has had chronic dysthymia that waxes and wanes, usually in response to stressors in her life.  Currently she is feeling stressors from family members.  She self-attributes her increase in depressed mood from her family interactions.  She has no concerns that it will persist.  She is not interested in counseling at this time.  We discussed her contacting our office if she changes her mind about counseling.

## 2022-06-08 NOTE — Progress Notes (Signed)
[name] is alone Sources of clinical information for visit is/are patient and past medical records. Nursing assessment for this office visit was reviewed with the patient for accuracy and revision.   Previous Report(s) Reviewed: Recent Consult notes for cardiology, lipid clinic, orthopedics, and allergist     06/07/2022   11:11 AM  Depression screen PHQ 2/9  Decreased Interest 2  Down, Depressed, Hopeless 2  PHQ - 2 Score 4  Altered sleeping 3  Tired, decreased energy 2  Change in appetite 2  Feeling bad or failure about yourself  2  Trouble concentrating 2  Moving slowly or fidgety/restless 0  Suicidal thoughts 2  PHQ-9 Score 17   Flowsheet Row Office Visit from 06/07/2022 in [facility] Office Visit from 10/05/2021 in [facility] Office Visit from 02/02/2021 in [facility]  Thoughts that you would be better off dead, or of hurting yourself in some way More than half the days Not at all Not at all  PHQ-9 Total Score 17 10 15           06/07/2022   10:06 AM 10/05/2021   10:35 AM 02/02/2021    2:10 PM 01/06/2020   11:11 AM 06/11/2019    9:27 AM  Fall Risk   Falls in the past year? 0 0 1 0 0  Number falls in past yr: 0 0 0    Injury with Fall? 0 0 1         06/07/2022   11:11 AM 10/05/2021   10:42 AM 02/02/2021    2:10 PM  PHQ9 SCORE ONLY  PHQ-9 Total Score 17 10 15     Adult vaccines due  Topic Date Due   TETANUS/TDAP  09/08/2030    Health Maintenance Due  Topic Date Due   Zoster Vaccines- Shingrix (1 of 2) Never done   COVID-19 Vaccine (6 - Pfizer risk series) 10/23/2021      History/P.E. limitations: none  Adult vaccines due  Topic Date Due   TETANUS/TDAP  09/08/2030   There are no preventive care reminders to display for this patient.  Health Maintenance Due  Topic Date Due   Zoster Vaccines- Shingrix (1 of 2) Never done   COVID-19 Vaccine (6 - Pfizer risk series) 10/23/2021     Chief  Complaint  Patient presents with   skin     --------------------------------------------------------------------------------------------------------------------------------------------- Visit Problem List with A/P  Atherosclerosis of aorta (HCC) Scrrening for DMT2 A1c 4.8 %. Assessment and Plan Low risk diabetes patient reassurance given  Essential hypertension Established problem. Adequate blood pressure control.  No evidence of new end organ damage.  Tolerating medication without significant adverse effects.  Plan to continue current blood pressure medication regiment.    Asthma, intermittent Established problem Well Controlled and is at goal of very limited use of albuterol rescue (less than once every 5-6 months, usually with viral uri) No signs of complications, medication side effects, or red flags. Continue current medications and other regiments.   Overweight (BMI 25.0-29.9) Wt Readings from Last 3 Encounters:  06/07/22 156 lb 2 oz (70.8 kg)  04/09/22 157 lb 3.2 oz (71.3 kg)  02/14/22 158 lb 2 oz (71.7 kg)  Stable Discussed dietary measures  Episodic headache Established problem Recent episodes of her usual headache.  Increased stress from family matters. No red flags of IC catastrophic headache or GCA. Normal Neuro exam Able to handle it with relaxation and APAP as needed  Very  likely Episodic Tension Type Headache clustering due to socio-emotional stress Reassurance provided Observe for now  Dysthymia Established problem worsened.     06/07/2022   11:11 AM 10/05/2021   10:42 AM 02/02/2021    2:10 PM  PHQ9 SCORE ONLY  PHQ-9 Total Score 17 10 15   Today's Q9 was a 2. Ms Harrold Donath about death, and whether she would be better of dead, but she has no intent and no plans.  Ms Cranford Mon has had chronic dysthymia that waxes and wanes, usually in response to stressors in her life.  Currently she is feeling stressors from family members.  She self-attributes her  increase in depressed mood from her family interactions.  She has no concerns that it will persist.  She is not interested in counseling at this time.  We discussed her contacting our office if she changes her mind about counseling.  Melasma New concern Chronic facial symmetric patches of darkened over lateral zygomatic and buccal skin No extension over medial zygomatic nor nasal bridge.  Facial Patcheses flat, without borders.  No secondary skin change lesions.  Surrounding skin is normal in appearance  I suspect this is melasma that has darkened further with age No specific recommendations at this time.  I am not certain that it would response to a topical hydroquine. If worsens, topical Azelaic acid could be tried.

## 2022-06-12 ENCOUNTER — Telehealth: Payer: Self-pay | Admitting: Pharmacist

## 2022-06-12 NOTE — Telephone Encounter (Signed)
Application for pt assistance faxed to SNF

## 2022-06-20 ENCOUNTER — Telehealth: Payer: Self-pay | Admitting: Interventional Cardiology

## 2022-06-20 NOTE — Telephone Encounter (Signed)
Returned call to patient and informed her that rosuvastatin was discontinued at last visit with pharmacist in [facility] on 06/06/22. Patient states [facility] auto-refilled her Rx, advised patient to contact [facility] and let them know she is no longer taking this medication.  Patient verbalized understanding and expressed appreciation for call.

## 2022-06-20 NOTE — Telephone Encounter (Signed)
Pt c/o medication issue:  1. Name of Medication: Rosuvastatin  2. How are you currently taking this medication (dosage and times per day)? 1 time a day  3. Are you having a reaction (difficulty breathing--STAT)?   4. What is your medication issue? She thought  she was supposed to be taking something else other than Rosuvastatin. Is she still supposed to continue taking the Rosuvastatin?

## 2022-06-22 ENCOUNTER — Telehealth: Payer: Self-pay | Admitting: Interventional Cardiology

## 2022-06-22 NOTE — Telephone Encounter (Signed)
Returned call to patient.  Patient states her heart has been "skipping beats, and making me nervous". She states this has happened in the past but did not go on for as long as they have today. She states these episodes would last a few seconds then stop for 3-4 minutes and continued this way on and off.   Patient denies any palpitations while speaking on the phone with me for approximately 25 minutes. Patient states she has not had as much water today. Encouraged increased fluid intake and to take it easy to not overexert herself over the weekend.  Patient denies CP, SOB, dizziness.  Advised patient on ED precautions for CP or palpitations with SOB, dizziness, fatigue.  Informed patient to update [place] if these episodes continue over the weekend after increasing fluid intake and resting.  Patient verbalized understanding and expressed appreciation for call.

## 2022-06-22 NOTE — Telephone Encounter (Signed)
Patient c/o Palpitations:  High priority if patient c/o lightheadedness, shortness of breath, or chest pain  How long have you had palpitations/irregular HR/ Afib? Are you having the symptoms now? yes  Are you currently experiencing lightheadedness, SOB or CP? no  Do you have a history of afib (atrial fibrillation) or irregular heart rhythm? no  Have you checked your BP or HR? (document readings if available): no  Are you experiencing any other symptoms? Heart keep beating fast off and on. Please advise

## 2022-06-25 NOTE — Telephone Encounter (Signed)
Called [employer] to follow up up on SNF application. Needs PA approval form. Called insurance to fax over approval letter. Faxed to SNF

## 2022-06-26 NOTE — Telephone Encounter (Signed)
Spoke with the patient who states that she has continued to have episodes of palpitations. She states it mostly occurs when she moves around. She states it feels similar to her symptoms that she had previously this summer. She wore a heart monitor at that time and showed some brief SVT and her tenormin was changed to metoprolol. She states that she has been taking her metoprolol as prescribed. She had been feeling good until recently when palpitations returned. She states that she is not feeling lightheaded, dizzy or short of breath. She does feel weak. She is scheduled to see Ambrose Pancoast, NP on Thursday 10/12. She will let us know if anything worsens before then

## 2022-06-26 NOTE — Telephone Encounter (Signed)
Pt states that her heart is still beating really fast and she can not get up and do hardly anything and would like a call back to discuss this.  Pt states she unsure what her HR is and that she believes that it could just be different speeds that change up fast.   She states she increased fluid intake and also rested over the weekend.
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
## 2022-06-28 ENCOUNTER — Encounter: Payer: Self-pay | Admitting: Nurse Practitioner

## 2022-06-28 ENCOUNTER — Ambulatory Visit: Payer: Medicare Other | Attending: Nurse Practitioner | Admitting: Nurse Practitioner

## 2022-06-28 VITALS — BP 142/76 | HR 76 | Ht 62.0 in | Wt 158.0 lb

## 2022-06-28 DIAGNOSIS — I10 Essential (primary) hypertension: Secondary | ICD-10-CM | POA: Diagnosis not present

## 2022-06-28 DIAGNOSIS — I2510 Atherosclerotic heart disease of native coronary artery without angina pectoris: Secondary | ICD-10-CM | POA: Diagnosis not present

## 2022-06-28 DIAGNOSIS — I493 Ventricular premature depolarization: Secondary | ICD-10-CM

## 2022-06-28 DIAGNOSIS — E663 Overweight: Secondary | ICD-10-CM

## 2022-06-28 MED ORDER — METOPROLOL SUCCINATE ER 50 MG PO TB24
ORAL_TABLET | ORAL | 3 refills | Status: DC
Start: 2022-06-28 — End: 2022-09-06

## 2022-06-28 NOTE — Patient Instructions (Signed)
Medication Instructions:  You may take an extra 1/2 tablet of Metoprolol as needed for palpitations   *If you need a refill on your cardiac medications before your next appointment, please call your pharmacy*   Lab Work: None ordered  If you have labs (blood work) drawn today and your tests are completely normal, you will receive your results only by: [place] (if you have MyChart) OR A paper copy in the mail If you have any lab test that is abnormal or we need to change your treatment, we will call you to review the results.   Testing/Procedures: None ordered    Follow-Up: At [facility], you and your health needs are our priority.  As part of our continuing mission to provide you with exceptional heart care, we have created designated Provider Care Teams.  These Care Teams include your primary Cardiologist (physician) and Advanced Practice Providers (APPs -  Physician Assistants and Nurse Practitioners) who all work together to provide you with the care you need, when you need it.  We recommend signing up for the patient portal called "MyChart".  Sign up information is provided on this After Visit Summary.  MyChart is used to connect with patients for Virtual Visits (Telemedicine).  Patients are able to view lab/test results, encounter notes, upcoming appointments, etc.  Non-urgent messages can be sent to your provider as well.   To learn more about what you can do with MyChart, go to [email or web address].    Your next appointment:   9 month(s)  The format for your next appointment:   In Person  Provider:   Sinclair Grooms, MD     Other Instructions   Important Information About Sugar

## 2022-07-02 ENCOUNTER — Telehealth: Payer: Self-pay | Admitting: Pharmacist

## 2022-07-02 MED ORDER — REPATHA PUSHTRONEX SYSTEM 420 MG/3.5ML ~~LOC~~ SOCT: 3.5000 mL | SUBCUTANEOUS | 11 refills | Status: DC

## 2022-07-02 NOTE — Telephone Encounter (Signed)
Patient denied patient assistance with [employer] but was able to re-new her [place] as the fund reopened for renewals last week  CARD NO. [id number]   CARD STATUS Active   BIN [id number]   PCN [id number]   PC GROUP [id number]

## 2022-07-02 NOTE — Telephone Encounter (Signed)
Patient called and given grant info. Rx sent to [facility].

## 2022-07-03 ENCOUNTER — Ambulatory Visit (INDEPENDENT_AMBULATORY_CARE_PROVIDER_SITE_OTHER): Payer: Medicare Other | Admitting: Gastroenterology

## 2022-07-03 ENCOUNTER — Encounter: Payer: Self-pay | Admitting: Gastroenterology

## 2022-07-03 DIAGNOSIS — K52831 Collagenous colitis: Secondary | ICD-10-CM

## 2022-07-03 DIAGNOSIS — K317 Polyp of stomach and duodenum: Secondary | ICD-10-CM | POA: Diagnosis not present

## 2022-07-03 DIAGNOSIS — K31A0 Gastric intestinal metaplasia, unspecified: Secondary | ICD-10-CM

## 2022-07-03 NOTE — Patient Instructions (Signed)
If you are age 77 or younger, your body mass index should be between 19-25. Your Body mass index is 28.9 kg/m. If this is out of the aformentioned range listed, please consider follow up with your Primary Care Provider.  ________________________________________________________  The [facility] providers would like to encourage you to use [facility] to communicate with providers for non-urgent requests or questions.  Due to long hold times on the telephone, sending your provider a message by [facility] may be a faster and more efficient way to get a response.  Please allow 48 business hours for a response.  Please remember that this is for non-urgent requests.  _______________________________________________________  [name] have been scheduled for an endoscopy. Please follow written instructions given to you at your visit today. If you use inhalers (even only as needed), please bring them with you on the day of your procedure.  Due to recent changes in healthcare laws, you may see the results of your imaging and laboratory studies on MyChart before your provider has had a chance to review them.  We understand that in some cases there may be results that are confusing or concerning to you. Not all laboratory results come back in the same time frame and the provider may be waiting for multiple results in order to interpret others.  Please give [place] 48 hours in order for your provider to thoroughly review all the results before contacting the office for clarification of your results.   Thank you for entrusting me with your care and choosing [facility].  Dr Havery Moros
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
## 2022-07-05 ENCOUNTER — Encounter: Payer: Self-pay | Admitting: Gastroenterology

## 2022-07-05 ENCOUNTER — Ambulatory Visit (AMBULATORY_SURGERY_CENTER): Payer: Medicare Other | Admitting: Gastroenterology

## 2022-07-05 VITALS — BP 146/62 | HR 78 | Temp 97.3°F | Resp 17 | Ht 62.0 in | Wt 158.0 lb

## 2022-07-05 DIAGNOSIS — K31A0 Gastric intestinal metaplasia, unspecified: Secondary | ICD-10-CM

## 2022-07-05 DIAGNOSIS — K317 Polyp of stomach and duodenum: Secondary | ICD-10-CM | POA: Diagnosis not present

## 2022-07-05 DIAGNOSIS — K449 Diaphragmatic hernia without obstruction or gangrene: Secondary | ICD-10-CM

## 2022-07-05 DIAGNOSIS — K2950 Unspecified chronic gastritis without bleeding: Secondary | ICD-10-CM | POA: Diagnosis not present

## 2022-07-05 MED ORDER — OMEPRAZOLE 40 MG PO CPDR: 40.0000 mg | DELAYED_RELEASE_CAPSULE | Freq: Every day | ORAL | 0 refills | Status: DC

## 2022-07-05 MED ORDER — SODIUM CHLORIDE 0.9 % IV SOLN: 500.0000 mL | INTRAVENOUS | Status: DC

## 2022-07-05 NOTE — Progress Notes (Signed)
History and Physical Interval Note: Patient seen in the office 10/17 - no interval changes. EGD to reassess gastric polyp site with history of GIM. Discussed risks / benefits she wishes to proceed.  07/05/2022 3:58 PM  [name]  has presented today for endoscopic procedure(s), with the diagnosis of  Encounter Diagnoses  Name Primary?   Gastric polyp Yes   Gastric intestinal metaplasia   .  The various methods of evaluation and treatment have been discussed with the patient and/or family. After consideration of risks, benefits and other options for treatment, the patient has consented to  the endoscopic procedure(s).   The patient's history has been reviewed, patient examined, no change in status, stable for surgery.  I have reviewed the patient's chart and labs.  Questions were answered to the patient's satisfaction.    Jolly Mango, MD [facility]

## 2022-07-05 NOTE — Progress Notes (Signed)
1603 Robinul 0.1 mg IV given due large amount of secretions upon assessment.  MD made aware, vss 

## 2022-07-05 NOTE — Patient Instructions (Signed)
The biopsies taken today have been sent for pathology.  The results can take 1-3 weeks to receive.    You may resume your previous diet and medication schedule.  A prescription for omeprazole (Prilosec) 40 mg has been sent to your pharmacy to reduce the risk of bleeding post procedure.  You should take this once a day for the next 30 days.  Thank you for allowing [place] to care for you today!!!   YOU HAD AN ENDOSCOPIC PROCEDURE TODAY AT [facility]:   Refer to the procedure report that was given to you for any specific questions about what was found during the examination.  If the procedure report does not answer your questions, please call your gastroenterologist to clarify.  If you requested that your care partner not be given the details of your procedure findings, then the procedure report has been included in a sealed envelope for you to review at your convenience later.  YOU SHOULD EXPECT: Some feelings of bloating in the abdomen. Passage of more gas than usual.  Walking can help get rid of the air that was put into your GI tract during the procedure and reduce the bloating.   Please Note:  You might notice some irritation and congestion in your nose or some drainage.  This is from the oxygen used during your procedure.  There is no need for concern and it should clear up in a day or so.  SYMPTOMS TO REPORT IMMEDIATELY:  Following upper endoscopy (EGD)  Vomiting of blood or coffee ground material  New chest pain or pain under the shoulder blades  Painful or persistently difficult swallowing  New shortness of breath  Fever of 100F or higher  Black, tarry-looking stools  For urgent or emergent issues, a gastroenterologist can be reached at any hour by calling [phone]. Do not use MyChart messaging for urgent concerns.    DIET:  We do recommend a small meal at first, but then you may proceed to your regular diet.  Drink plenty of fluids but you should avoid  alcoholic beverages for 24 hours.  ACTIVITY:  You should plan to take it easy for the rest of today and you should NOT DRIVE or use heavy machinery until tomorrow (because of the sedation medicines used during the test).    FOLLOW UP: Our staff will call the number listed on your records the next business day following your procedure.  We will call around 7:15- 8:00 am to check on you and address any questions or concerns that you may have regarding the information given to you following your procedure. If we do not reach you, we will leave a message.     If any biopsies were taken you will be contacted by phone or by letter within the next 1-3 weeks.  Please call us at [phone] if you have not heard about the biopsies in 3 weeks.    SIGNATURES/CONFIDENTIALITY: You and/or your care partner have signed paperwork which will be entered into your electronic medical record.  These signatures attest to the fact that that the information above on your After Visit Summary has been reviewed and is understood.  Full responsibility of the confidentiality of this discharge information lies with you and/or your care-partner.

## 2022-07-05 NOTE — Op Note (Signed)
[name] Patient Name: [name] Procedure Date: 07/05/2022 3:46 PM MRN: [id number] Endoscopist: Remo Lipps P. Havery Moros , MD Age: 76 Referring MD:  Date of Birth: 1945/12/16 Gender: Female Account #: [id number] Procedure:                Upper GI endoscopy Indications:              Follow-up of gastric polyp - history of large                            gastric polyp removed in 2021 - "polypoid                            hyperplastic gastric mucosa with dense plasma cell                            infiltrate, consistent with Russel body gastritis",                            surveillance EGD 06/2021 showed hyperplastic polyp                            with GIM with retained hemostasis clips. Here for                            follow up given findings of GIM Medicines:                Monitored Anesthesia Care Procedure:                Pre-Anesthesia Assessment:                           - Prior to the procedure, a History and Physical                            was performed, and patient medications and                            allergies were reviewed. The patient's tolerance of                            previous anesthesia was also reviewed. The risks                            and benefits of the procedure and the sedation                            options and risks were discussed with the patient.                            All questions were answered, and informed consent                            was obtained. Prior Anticoagulants: The patient has  taken no previous anticoagulant or antiplatelet                            agents. ASA Grade Assessment: II - A patient with                            mild systemic disease. After reviewing the risks                            and benefits, the patient was deemed in                            satisfactory condition to undergo the procedure.                           After obtaining informed  consent, the endoscope was                            passed under direct vision. Throughout the                            procedure, the patient's blood pressure, pulse, and                            oxygen saturations were monitored continuously. The                            GIF [id number] #[id number] was introduced through the                            mouth, and advanced to the second part of duodenum.                            The upper GI endoscopy was accomplished without                            difficulty. The patient tolerated the procedure                            well. Scope In: Scope Out: Findings:                 Esophagogastric landmarks were identified: the                            Z-line was found at 36 cm, the gastroesophageal                            junction was found at 36 cm and the upper extent of                            the gastric folds was found at 37 cm from the  incisors.                           A 1 cm hiatal hernia was present.                           The exam of the esophagus was otherwise normal.                           A single sessile polypoid lesion was found in the                            proximal gastric body with two retained hemostasis                            clips from prior resection. This appeared to be a                            sessile polypoid lesion perhaps 6-55mm or so, and                            clips seemed superficial. I initially attempted to                            remove the lesion via hot snare to remove the clips                            with it. Cautery applied but the clip was adherent                            deeper than expected and did not cut through. The                            polypoid tissue appeared cauterized / distorted                            following this intervention. No bleeding noted.                            Biopsies were taken at the periphery of the  lesion                            with a cold forceps for histology.                           The exam of the stomach was otherwise normal.                           The examined duodenum was normal. Complications:            No immediate complications. Estimated blood loss:                            Minimal. Estimated Blood  Loss:     Estimated blood loss was minimal. Impression:               - Esophagogastric landmarks identified.                           - 1 cm hiatal hernia.                           - Normal esophagus otherwise                           - A single gastric polypoid lesion with 2 adherent                            clips as described above. Hot snare removal                            attempted but not successful due to retained clips.                            Biopsied. No bleeding appreciated.                           - Normal examined duodenum. Recommendation:           - Patient has a contact number available for                            emergencies. The signs and symptoms of potential                            delayed complications were discussed with the                            patient. Return to normal activities tomorrow.                            Written discharge instructions were provided to the                            patient.                           - Resume previous diet.                           - Continue present medications.                           - Start omeprazole 40mg  / day for 30 days post                            polypectomy attempt, reduce risk for bleeding                           - Await pathology results.                           -  Avoid NSAIDs Remo Lipps P. Rocco Kerkhoff, MD 07/05/2022 4:37:13 PM This report has been signed electronically.

## 2022-07-05 NOTE — Progress Notes (Signed)
Report given to PACU, vss 

## 2022-07-05 NOTE — Progress Notes (Signed)
Called to room to assist during endoscopic procedure.  Patient ID and intended procedure confirmed with present staff. Received instructions for my participation in the procedure from the performing physician.  

## 2022-07-06 ENCOUNTER — Telehealth: Payer: Self-pay

## 2022-07-06 ENCOUNTER — Telehealth: Payer: Self-pay | Admitting: Interventional Cardiology

## 2022-07-06 NOTE — Telephone Encounter (Signed)
Spoke with pt and advised pt per [facility] palpitations would be a rare and unlikely adverse effect.  Pt verbalizes understanding and thanked Therapist, sports for the call.

## 2022-07-06 NOTE — Telephone Encounter (Signed)
This would be a very rare and unlikely adverse effect

## 2022-07-06 NOTE — Telephone Encounter (Signed)
  Follow up Call-     07/05/2022    3:01 PM 07/05/2021    9:29 AM  Call back number  Post procedure Call Back phone  # [phone]  Permission to leave phone message Yes Yes     Patient questions:  Do you have a fever, pain , or abdominal swelling? No. Pain Score  0 *  Have you tolerated food without any problems? Yes.    Have you been able to return to your normal activities? Yes.    Do you have any questions about your discharge instructions: Diet   No. Medications  No. Follow up visit  No.  Do you have questions or concerns about your Care? No.  Actions: * If pain score is 4 or above: No action needed, pain <4.

## 2022-07-06 NOTE — Telephone Encounter (Signed)
Pt c/o medication issue:  1. Name of Medication:  Omeprazole 40 MG  2. How are you currently taking this medication (dosage and times per day)?  Once daily by mouth   3. Are you having a reaction (difficulty breathing--STAT)?   4. What is your medication issue?   Patient states she had an endoscopy yesterday and the provider who performed the procedure put her on Omeprazole 40 MG tablet. Patient states she read that palpitations are a side effect of taking Omeprazole. Patient would like to know if Dr. Tamala Julian is agreeable. Please advise.

## 2022-07-11 ENCOUNTER — Encounter: Payer: Self-pay | Admitting: Gastroenterology

## 2022-07-28 ENCOUNTER — Other Ambulatory Visit: Payer: Self-pay | Admitting: Gastroenterology

## 2022-07-28 DIAGNOSIS — K31A0 Gastric intestinal metaplasia, unspecified: Secondary | ICD-10-CM

## 2022-07-28 DIAGNOSIS — K317 Polyp of stomach and duodenum: Secondary | ICD-10-CM

## 2022-08-16 ENCOUNTER — Telehealth: Payer: Self-pay | Admitting: Interventional Cardiology

## 2022-08-16 NOTE — Telephone Encounter (Signed)
Returned call to patient.  Patient states she continues to have a cough at night and this causes her heart to beat irregularly. She states she feels her heart beats irregularly when she is walking, cooking, cleaning, almost with any activity. She states she gets tired and weak and will need to sit down and rest before continuing with any task..  Reviewed last office visit note with Robin Searing, NP in October where it was recommended she take an extra half tablet of Toprol XL as needed for palpitations. Discussed with patient and advised her to try this to see if it helps ease her palpitations.  Patient verbalized understanding.  Patient also asked if it is okay for her to take CoQ10 while on Repatha.  Will forward to Dr. Katrinka Blazing and PharmD to review and advise.

## 2022-08-16 NOTE — Telephone Encounter (Signed)
Yes she can take CoQ10, no issue with her Repatha.

## 2022-08-16 NOTE — Telephone Encounter (Signed)
Patient called stating she is coughing a lot at night, and it causing her to have an irregular heart beat. It's not getting any better. She would like to speak to the nurse about this.

## 2022-08-19 ENCOUNTER — Other Ambulatory Visit: Payer: Self-pay | Admitting: Interventional Cardiology

## 2022-09-03 NOTE — Telephone Encounter (Signed)
Pt c/o medication issue:  1. Name of Medication:   metoprolol succinate (TOPROL-XL) 50 MG 24 hr tablet   2. How are you currently taking this medication (dosage and times per day)? As prescribed  3. Are you having a reaction (difficulty breathing--STAT)?   No  4. What is your medication issue?   Patient wanted to follow-up with Dr. Michaelle Copas RN regarding this medication.

## 2022-09-03 NOTE — Telephone Encounter (Signed)
Returned call to patient. She reports she has been taking her metoprolol succinate 50mg  daily with an additional 25mg  in the evening for palpitations. She reports this has been helping to ease her palpitations and has been doing this consistently for the last 2 weeks.  Will forward to Dr. to review and advise on adjusting dose.

## 2022-09-06 MED ORDER — METOPROLOL SUCCINATE ER 50 MG PO TB24: ORAL_TABLET | ORAL | 3 refills | Status: DC

## 2022-09-06 NOTE — Telephone Encounter (Signed)
Shared with patient Dr. Michaelle Copas reply to medication dose change:  Yes that is okay to increase the Toprol-XL to 50 mg a 25 mg p.m.   Patient verbalized understanding and confirmed pharmacy for new Rx.

## 2022-09-27 ENCOUNTER — Other Ambulatory Visit (HOSPITAL_COMMUNITY): Payer: Self-pay

## 2022-09-28 ENCOUNTER — Other Ambulatory Visit (HOSPITAL_COMMUNITY): Payer: Self-pay

## 2022-10-05 ENCOUNTER — Ambulatory Visit
Admission: EM | Admit: 2022-10-05 | Discharge: 2022-10-05 | Disposition: A | Discharge status: Home / Self Care | Payer: Medicare Other

## 2022-10-05 ENCOUNTER — Ambulatory Visit (INDEPENDENT_AMBULATORY_CARE_PROVIDER_SITE_OTHER): Payer: Medicare Other

## 2022-10-05 DIAGNOSIS — R059 Cough, unspecified: Secondary | ICD-10-CM | POA: Diagnosis not present

## 2022-10-05 DIAGNOSIS — R0602 Shortness of breath: Secondary | ICD-10-CM | POA: Diagnosis not present

## 2022-10-05 DIAGNOSIS — R5383 Other fatigue: Secondary | ICD-10-CM

## 2022-10-05 LAB — POCT URINALYSIS DIP (MANUAL ENTRY)
Bilirubin, UA: NEGATIVE
Blood, UA: NEGATIVE
Glucose, UA: NEGATIVE mg/dL
Ketones, POC UA: NEGATIVE mg/dL
Leukocytes, UA: NEGATIVE
Nitrite, UA: NEGATIVE
Spec Grav, UA: 1.02 (ref 1.010–1.025)
Urobilinogen, UA: 0.2 E.U./dL
pH, UA: 6 (ref 5.0–8.0)
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
## 2022-10-05 NOTE — ED Triage Notes (Signed)
Pt. Presents to UC w/ c/o fatigue, weakness and decrease in appetite since December.

## 2022-10-05 NOTE — Discharge Instructions (Signed)
Follow up with your primary care provider if your symptoms are worsening or not improving.    

## 2022-10-06 LAB — CBC WITH DIFFERENTIAL/PLATELET
Basophils Absolute: 0.1 10*3/uL (ref 0.0–0.2)
Basos: 1 %
EOS (ABSOLUTE): 0.3 10*3/uL (ref 0.0–0.4)
Eos: 3 %
Hematocrit: 32.8 % — ABNORMAL LOW (ref 34.0–46.6)
Hemoglobin: 10.9 g/dL — ABNORMAL LOW (ref 11.1–15.9)
Immature Grans (Abs): 0.1 10*3/uL (ref 0.0–0.1)
Immature Granulocytes: 1 %
Lymphocytes Absolute: 2.3 10*3/uL (ref 0.7–3.1)
Lymphs: 31 %
MCH: 30.6 pg (ref 26.6–33.0)
MCHC: 33.2 g/dL (ref 31.5–35.7)
MCV: 92 fL (ref 79–97)
Monocytes Absolute: 1.1 10*3/uL — ABNORMAL HIGH (ref 0.1–0.9)
Monocytes: 15 %
Neutrophils Absolute: 3.6 10*3/uL (ref 1.4–7.0)
Neutrophils: 49 %
Platelets: 484 10*3/uL — ABNORMAL HIGH (ref 150–450)
RBC: 3.56 x10E6/uL — ABNORMAL LOW (ref 3.77–5.28)
RDW: 12.5 % (ref 11.7–15.4)
WBC: 7.4 10*3/uL (ref 3.4–10.8)

## 2022-10-06 LAB — COMPREHENSIVE METABOLIC PANEL
ALT: 8 IU/L (ref 0–32)
AST: 14 IU/L (ref 0–40)
Albumin/Globulin Ratio: 1.2 (ref 1.2–2.2)
Albumin: 3.7 g/dL — ABNORMAL LOW (ref 3.8–4.8)
Alkaline Phosphatase: 96 IU/L (ref 44–121)
BUN/Creatinine Ratio: 10 — ABNORMAL LOW (ref 12–28)
BUN: 16 mg/dL (ref 8–27)
Bilirubin Total: 0.4 mg/dL (ref 0.0–1.2)
CO2: 17 mmol/L — ABNORMAL LOW (ref 20–29)
Calcium: 9.4 mg/dL (ref 8.7–10.3)
Chloride: 99 mmol/L (ref 96–106)
Creatinine, Ser: 1.53 mg/dL — ABNORMAL HIGH (ref 0.57–1.00)
Globulin, Total: 3.1 g/dL (ref 1.5–4.5)
Glucose: 86 mg/dL (ref 70–99)
Potassium: 3.4 mmol/L — ABNORMAL LOW (ref 3.5–5.2)
Sodium: 134 mmol/L (ref 134–144)
Total Protein: 6.8 g/dL (ref 6.0–8.5)
eGFR: 35 mL/min/{1.73_m2} — ABNORMAL LOW (ref >59)

## 2022-10-06 LAB — TSH: TSH: 0.544 u[IU]/mL (ref 0.450–4.500)

## 2022-10-06 LAB — CK: Total CK: 62 U/L (ref 32–182)

## 2022-10-25 ENCOUNTER — Encounter: Payer: Self-pay | Admitting: Family Medicine

## 2022-10-25 ENCOUNTER — Ambulatory Visit (INDEPENDENT_AMBULATORY_CARE_PROVIDER_SITE_OTHER): Payer: Medicare Other | Admitting: Family Medicine

## 2022-10-25 VITALS — BP 122/59 | HR 73 | Ht 62.0 in | Wt 151.4 lb

## 2022-10-25 DIAGNOSIS — R7989 Other specified abnormal findings of blood chemistry: Secondary | ICD-10-CM | POA: Diagnosis not present

## 2022-10-25 DIAGNOSIS — IMO0002 Reserved for concepts with insufficient information to code with codable children: Secondary | ICD-10-CM | POA: Insufficient documentation

## 2022-10-25 DIAGNOSIS — S37009A Unspecified injury of unspecified kidney, initial encounter: Secondary | ICD-10-CM

## 2022-10-25 DIAGNOSIS — F4320 Adjustment disorder, unspecified: Secondary | ICD-10-CM

## 2022-10-25 DIAGNOSIS — D649 Anemia, unspecified: Secondary | ICD-10-CM | POA: Diagnosis not present

## 2022-10-25 DIAGNOSIS — D75839 Thrombocytosis, unspecified: Secondary | ICD-10-CM | POA: Insufficient documentation

## 2022-10-25 DIAGNOSIS — F4322 Adjustment disorder with anxiety: Secondary | ICD-10-CM

## 2022-10-25 HISTORY — DX: Adjustment disorder, unspecified: F43.20

## 2022-10-25 NOTE — Assessment & Plan Note (Signed)
New finding. No clear symptoms or signs of infection. Other than her history of collagenous colitis, not chronic inflammatory condition.  CBC with diff 1/19 showed atypical lymphocytes and polychromasia Recheck CBC with diff today

## 2022-10-25 NOTE — Assessment & Plan Note (Signed)
Normaocytic anemia, mild, Hg 10.9 with prior hg within normal limits. 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

## 2022-10-25 NOTE — Assessment & Plan Note (Signed)
Recurrence. Recent death of her sister. Thinking about death more than usual but no intention or plan to harm herself.  We discussed Grief Counseling thru [facility].

## 2022-10-25 NOTE — Progress Notes (Signed)
[name] is alone Sources of clinical information for visit is/are patient. Nursing assessment for this office visit was reviewed with the patient for accuracy and revision.   Previous Report(s) Reviewed: Last GI office visit notes.     10/25/2022    9:40 AM  Depression screen PHQ 2/9  Decreased Interest 2  Down, Depressed, Hopeless 2  PHQ - 2 Score 4  Altered sleeping 2  Tired, decreased energy 3  Change in appetite 3  Feeling bad or failure about yourself  2  Trouble concentrating 2  Moving slowly or fidgety/restless 0  Suicidal thoughts 2  PHQ-9 Score 18  Difficult doing work/chores Somewhat difficult   [place] Visit from 10/25/2022 in [place] Office Visit from 06/07/2022 in [place] Office Visit from 10/05/2021 in [place]  Thoughts that you would be better off dead, or of hurting yourself in some way More than half the days More than half the days Not at all  PHQ-9 Total Score 18 17 10           10/25/2022    9:40 AM 06/07/2022   10:06 AM 10/05/2021   10:35 AM 02/02/2021    2:10 PM 01/06/2020   11:11 AM  [place] in the past year? 0 0 0 1 0  Number falls in past yr: 0 0 0 0   Injury with Fall? 0 0 0 1        10/25/2022    9:40 AM 06/07/2022   11:11 AM 10/05/2021   10:42 AM  PHQ9 SCORE ONLY  PHQ-9 Total Score 18 17 10     There are no preventive care reminders to display for this patient.  Health Maintenance Due  Topic Date Due   Zoster Vaccines- Shingrix (1 of 2) Never done   Medicare Annual Wellness (AWV)  03/14/2018      History/P.E. limitations: none  There are no preventive care reminders to display for this patient. There are no preventive care reminders to display for this patient.  Health Maintenance Due  Topic Date Due   Zoster Vaccines- Shingrix (1 of 2) Never done   Medicare Annual Wellness (AWV)  03/14/2018     No chief complaint on file.     --------------------------------------------------------------------------------------------------------------------------------------------- Visit Problem List with A/P  No problem-specific Assessment & Plan notes found for this encounter.

## 2022-10-25 NOTE — Assessment & Plan Note (Signed)
Labs drawn when very fatigued showed rise in serum creatinine to 1.5 from baseline aroudn 1.2 in mid Dec.  Accompanying decrease bicarb to 17 (AG 18) Working explanation is dehydration and diarrhea with decreaased oral intake from grief reaction.   Rechecking labs today.

## 2022-10-25 NOTE — Patient Instructions (Addendum)
WE are rechecking your lab work from your Urgent Care visit on 1/19.   Please work on increasing how much you drink.  Try to drink at least 8 ounces three times a day, or more.  Consider taking a daily Boost nutritional supplement drink every day.  If youcan, take the kind of Boost with extra-protein.   Once all the lab work is back, Dr Tiffiny Worthy will call you to discuss results.   Grief counseling is available, free of charge, from [facility]) [phone].

## 2022-10-26 ENCOUNTER — Telehealth: Payer: Self-pay | Admitting: Family Medicine

## 2022-10-26 LAB — CBC WITH DIFFERENTIAL/PLATELET
Basophils Absolute: 0 10*3/uL (ref 0.0–0.2)
Basos: 1 %
EOS (ABSOLUTE): 0.2 10*3/uL (ref 0.0–0.4)
Eos: 4 %
Hematocrit: 28.3 % — ABNORMAL LOW (ref 34.0–46.6)
Hemoglobin: 9.8 g/dL — ABNORMAL LOW (ref 11.1–15.9)
Immature Grans (Abs): 0.1 10*3/uL (ref 0.0–0.1)
Immature Granulocytes: 1 %
Lymphocytes Absolute: 1.6 10*3/uL (ref 0.7–3.1)
Lymphs: 27 %
MCH: 31.4 pg (ref 26.6–33.0)
MCHC: 34.6 g/dL (ref 31.5–35.7)
MCV: 91 fL (ref 79–97)
Monocytes Absolute: 0.6 10*3/uL (ref 0.1–0.9)
Monocytes: 10 %
Neutrophils Absolute: 3.6 10*3/uL (ref 1.4–7.0)
Neutrophils: 57 %
Platelets: 471 10*3/uL — ABNORMAL HIGH (ref 150–450)
RBC: 3.12 x10E6/uL — ABNORMAL LOW (ref 3.77–5.28)
RDW: 12.4 % (ref 11.7–15.4)
WBC: 6.1 10*3/uL (ref 3.4–10.8)

## 2022-10-26 LAB — RETICULOCYTES: Retic Ct Pct: 1.9 % (ref 0.6–2.6)

## 2022-10-26 LAB — RENAL FUNCTION PANEL
Albumin: 3.6 g/dL — ABNORMAL LOW (ref 3.8–4.8)
BUN/Creatinine Ratio: 9 — ABNORMAL LOW (ref 12–28)
BUN: 10 mg/dL (ref 8–27)
CO2: 19 mmol/L — ABNORMAL LOW (ref 20–29)
Calcium: 9.6 mg/dL (ref 8.7–10.3)
Chloride: 106 mmol/L (ref 96–106)
Creatinine, Ser: 1.14 mg/dL — ABNORMAL HIGH (ref 0.57–1.00)
Glucose: 86 mg/dL (ref 70–99)
Phosphorus: 3.6 mg/dL (ref 3.0–4.3)
Potassium: 3.3 mmol/L — ABNORMAL LOW (ref 3.5–5.2)
Sodium: 142 mmol/L (ref 134–144)
eGFR: 50 mL/min/{1.73_m2} — ABNORMAL LOW (ref >59)

## 2022-10-26 LAB — FERRITIN: Ferritin: 88 ng/mL (ref 15–150)

## 2022-10-26 LAB — SEDIMENTATION RATE: Sed Rate: 21 mm/hr (ref 0–40)

## 2022-10-26 LAB — MAGNESIUM: Magnesium: 1.9 mg/dL (ref 1.6–2.3)

## 2022-10-26 NOTE — Telephone Encounter (Signed)
I spoke with [name] about her lab results from yesterday. Recommended she increase her potassium suppl. To two tab twice a day Recommended taking a nutritional suppl/ drink, one a day.  Will recheck CMEt and CBC with Diff for ACD, mild thrombocytosis, and low bicarb in 2 months.  I asked [name] to make an appointment to see me back in two months.

## 2022-11-14 ENCOUNTER — Encounter: Payer: Self-pay | Admitting: Gastroenterology

## 2022-11-14 ENCOUNTER — Ambulatory Visit: Payer: Medicare Other | Admitting: Gastroenterology

## 2022-11-14 VITALS — BP 104/62 | HR 88 | Ht 61.0 in | Wt 143.0 lb

## 2022-11-14 DIAGNOSIS — R630 Anorexia: Secondary | ICD-10-CM | POA: Diagnosis not present

## 2022-11-14 DIAGNOSIS — R634 Abnormal weight loss: Secondary | ICD-10-CM

## 2022-11-14 DIAGNOSIS — K52831 Collagenous colitis: Secondary | ICD-10-CM | POA: Diagnosis not present

## 2022-11-14 DIAGNOSIS — T753XXS Motion sickness, sequela: Secondary | ICD-10-CM

## 2022-11-14 DIAGNOSIS — K317 Polyp of stomach and duodenum: Secondary | ICD-10-CM | POA: Diagnosis not present

## 2022-11-14 MED ORDER — ONDANSETRON 4 MG PO TBDP: 4.0000 mg | ORAL_TABLET | Freq: Three times a day (TID) | ORAL | 1 refills | Status: AC | PRN

## 2022-11-14 MED ORDER — SCOPOLAMINE 1 MG/3DAYS TD PT72: 1.0000 | MEDICATED_PATCH | TRANSDERMAL | 0 refills | Status: DC

## 2022-11-14 NOTE — Patient Instructions (Addendum)
If your blood pressure at your visit was 140/90 or greater, please contact your primary care physician to follow up on this.  _______________________________________________________  If you are age 78 or older, your body mass index should be between 23-30. Your Body mass index is 27.02 kg/m. If this is out of the aforementioned range listed, please consider follow up with your Primary Care Provider.  If you are age 44 or younger, your body mass index should be between 19-25. Your Body mass index is 27.02 kg/m. If this is out of the aformentioned range listed, please consider follow up with your Primary Care Provider.   ________________________________________________________  The [place] [facility] providers would like to encourage you to use [facility] to communicate with providers for non-urgent requests or questions.  Due to long hold times on the telephone, sending your provider a message by [facility] may be a faster and more efficient way to get a response.  Please allow 48 business hours for a response.  Please remember that this is for non-urgent requests.  _______________________________________________________   We have sent the following medications to your pharmacy for you to pick up at your convenience: Zofran ODT: dissolve 1 tablet orally every 8 hours as needed  Scopolamine patch: Use 1 patch every 3 days as needed ___________________________________________________  You have been scheduled for a CT scan of the abdomen and pelvis at [facility], 1st floor Radiology. You are scheduled on Wednesday, 3-6 at 12:30pm. You should arrive 15 minutes prior to your appointment time for registration.    If you have any questions regarding your exam or if you need to reschedule, you may call [facility] at [phone] between the hours of 8:00 am and 5:00 pm, Monday-Friday.   Thank you for entrusting me with your care and for choosing [facility], Dr. Genoa Cellar
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
## 2022-11-16 ENCOUNTER — Telehealth: Payer: Self-pay | Admitting: *Deleted

## 2022-11-16 MED ORDER — MIRTAZAPINE 15 MG PO TABS: 15.0000 mg | ORAL_TABLET | Freq: Every day | ORAL | 3 refills | Status: DC

## 2022-11-16 NOTE — Telephone Encounter (Signed)
-----   Message from Yetta Flock, MD sent at 11/15/2022  1:05 PM EST ----- Regarding: FW: mutual patient Jan can you let the patient know I have been in touch with Dr. McDiarmid about her case and he agrees that would be good to try some Remeron. Can you let her know and order Remeron '15mg'$  q HS - 30 day supply with 3 refills to start? He will be following up with her about this and I will relay results of CT once done. Thank you  Dr. Loni Muse   ----- Message ----- From: McDiarmid, Blane Ohara, MD Sent: 11/15/2022   8:57 AM EST To: Yetta Flock, MD Subject: RE: mutual patient                             Hello Richardson Landry- Thank you for thinking about remeron for Ms Perkinson.  It sounds like a good idea.  Will you go ahead and start it or do you want me to?  I will follow her up about it in either case. Todd ----- Message ----- From: Yetta Flock, MD Sent: 11/14/2022  12:44 PM EST To: Blane Ohara McDiarmid, MD Subject: mutual patient                                 Hi there, Hope you are doing well.  I wanted to touch base about this mutual patient.  She saw me today for some poor appetite and she is really not eating well, has lost some weight.  I am doing a CT to make sure were not missing anything concerning for her weight loss but not sure if this has anything to do with her sister passing away as she has been grieving this?.  She endorsed some symptoms of depression today.  Curious if you thought it would be okay to give her some Remeron nightly for her mood and to help stimulate her appetite?  Thanks for your opinion.  Richardson Landry

## 2022-11-16 NOTE — Telephone Encounter (Signed)
Patient informed to start Remeron. Script sent to pharmacy.

## 2022-11-16 NOTE — Telephone Encounter (Signed)
Called patient unable to leave message as mailbox is full.

## 2022-11-21 ENCOUNTER — Ambulatory Visit (HOSPITAL_COMMUNITY): Payer: Medicare Other

## 2022-11-26 NOTE — Telephone Encounter (Signed)
Patient called to reschedule CT appt.

## 2022-11-26 NOTE — Telephone Encounter (Signed)
Message to schedulers to reach out to patient to reschedule CT at her convenience

## 2022-12-04 DIAGNOSIS — H1045 Other chronic allergic conjunctivitis: Secondary | ICD-10-CM | POA: Diagnosis not present

## 2022-12-04 DIAGNOSIS — Z961 Presence of intraocular lens: Secondary | ICD-10-CM | POA: Diagnosis not present

## 2022-12-04 DIAGNOSIS — H04123 Dry eye syndrome of bilateral lacrimal glands: Secondary | ICD-10-CM | POA: Diagnosis not present

## 2022-12-04 DIAGNOSIS — H353131 Nonexudative age-related macular degeneration, bilateral, early dry stage: Secondary | ICD-10-CM | POA: Diagnosis not present

## 2022-12-11 ENCOUNTER — Other Ambulatory Visit: Payer: Self-pay | Admitting: Gastroenterology

## 2022-12-12 ENCOUNTER — Ambulatory Visit (HOSPITAL_COMMUNITY): Payer: Medicare Other

## 2023-01-03 ENCOUNTER — Ambulatory Visit (INDEPENDENT_AMBULATORY_CARE_PROVIDER_SITE_OTHER): Payer: Medicare Other | Admitting: Family Medicine

## 2023-01-03 ENCOUNTER — Encounter: Payer: Self-pay | Admitting: Family Medicine

## 2023-01-03 VITALS — BP 116/60 | HR 73 | Ht 61.0 in | Wt 143.5 lb

## 2023-01-03 DIAGNOSIS — R634 Abnormal weight loss: Secondary | ICD-10-CM

## 2023-01-03 DIAGNOSIS — N1831 Chronic kidney disease, stage 3a: Secondary | ICD-10-CM

## 2023-01-03 DIAGNOSIS — I700 Atherosclerosis of aorta: Secondary | ICD-10-CM

## 2023-01-03 DIAGNOSIS — D649 Anemia, unspecified: Secondary | ICD-10-CM

## 2023-01-03 DIAGNOSIS — D75839 Thrombocytosis, unspecified: Secondary | ICD-10-CM

## 2023-01-03 DIAGNOSIS — T753XXS Motion sickness, sequela: Secondary | ICD-10-CM

## 2023-01-03 DIAGNOSIS — R630 Anorexia: Secondary | ICD-10-CM

## 2023-01-03 DIAGNOSIS — K52831 Collagenous colitis: Secondary | ICD-10-CM | POA: Diagnosis not present

## 2023-01-03 DIAGNOSIS — R7989 Other specified abnormal findings of blood chemistry: Secondary | ICD-10-CM | POA: Diagnosis not present

## 2023-01-03 DIAGNOSIS — K317 Polyp of stomach and duodenum: Secondary | ICD-10-CM

## 2023-01-03 DIAGNOSIS — I10 Essential (primary) hypertension: Secondary | ICD-10-CM | POA: Diagnosis not present

## 2023-01-03 DIAGNOSIS — E876 Hypokalemia: Secondary | ICD-10-CM

## 2023-01-03 DIAGNOSIS — E7800 Pure hypercholesterolemia, unspecified: Secondary | ICD-10-CM

## 2023-01-03 DIAGNOSIS — F39 Unspecified mood [affective] disorder: Secondary | ICD-10-CM

## 2023-01-03 DIAGNOSIS — K219 Gastro-esophageal reflux disease without esophagitis: Secondary | ICD-10-CM

## 2023-01-03 MED ORDER — METOPROLOL SUCCINATE ER 50 MG PO TB24: ORAL_TABLET | ORAL | 3 refills | Status: DC

## 2023-01-03 NOTE — Patient Instructions (Addendum)
Let's get you scheduled for you CAT scan that Dr Adela Lank wanted.   Consider stopping your daily aspirin or cutting it down to once every three days.   Keep taking the Boost drink.  If you can only drink a half a container, that is better than not drinking any.    Your blood pressure is under good control.  Keep taking your metoprolol, one tablet in the morning and half a tablet in the evening.   We are checking you kidneys, hemoglobin, and electrolytes today.

## 2023-01-04 ENCOUNTER — Telehealth: Payer: Self-pay | Admitting: Family Medicine

## 2023-01-04 ENCOUNTER — Encounter: Payer: Self-pay | Admitting: Family Medicine

## 2023-01-04 DIAGNOSIS — N1831 Chronic kidney disease, stage 3a: Secondary | ICD-10-CM

## 2023-01-04 HISTORY — DX: Chronic kidney disease, stage 3a: N18.31

## 2023-01-04 LAB — RENAL FUNCTION PANEL
Albumin: 3.6 g/dL — ABNORMAL LOW (ref 3.8–4.8)
BUN/Creatinine Ratio: 8 — ABNORMAL LOW (ref 12–28)
BUN: 9 mg/dL (ref 8–27)
CO2: 17 mmol/L — ABNORMAL LOW (ref 20–29)
Calcium: 9.4 mg/dL (ref 8.7–10.3)
Chloride: 109 mmol/L — ABNORMAL HIGH (ref 96–106)
Creatinine, Ser: 1.17 mg/dL — ABNORMAL HIGH (ref 0.57–1.00)
Glucose: 85 mg/dL (ref 70–99)
Phosphorus: 2.9 mg/dL — ABNORMAL LOW (ref 3.0–4.3)
Potassium: 3.6 mmol/L (ref 3.5–5.2)
Sodium: 140 mmol/L (ref 134–144)
eGFR: 48 mL/min/{1.73_m2} — ABNORMAL LOW (ref >59)

## 2023-01-04 LAB — CBC WITH DIFFERENTIAL/PLATELET
Basophils Absolute: 0 10*3/uL (ref 0.0–0.2)
Basos: 0 %
EOS (ABSOLUTE): 0.2 10*3/uL (ref 0.0–0.4)
Eos: 3 %
Hematocrit: 26.7 % — ABNORMAL LOW (ref 34.0–46.6)
Hemoglobin: 8.8 g/dL — ABNORMAL LOW (ref 11.1–15.9)
Immature Grans (Abs): 0 10*3/uL (ref 0.0–0.1)
Immature Granulocytes: 1 %
Lymphocytes Absolute: 1.4 10*3/uL (ref 0.7–3.1)
Lymphs: 24 %
MCH: 30.4 pg (ref 26.6–33.0)
MCHC: 33 g/dL (ref 31.5–35.7)
MCV: 92 fL (ref 79–97)
Monocytes Absolute: 0.6 10*3/uL (ref 0.1–0.9)
Monocytes: 11 %
Neutrophils Absolute: 3.5 10*3/uL (ref 1.4–7.0)
Neutrophils: 61 %
Platelets: 425 10*3/uL (ref 150–450)
RBC: 2.89 x10E6/uL — ABNORMAL LOW (ref 3.77–5.28)
RDW: 14 % (ref 11.7–15.4)
WBC: 5.7 10*3/uL (ref 3.4–10.8)

## 2023-01-04 LAB — PROTEIN / CREATININE RATIO, URINE
Creatinine, Urine: 202 mg/dL
Protein, Ur: 34.8 mg/dL
Protein/Creat Ratio: 172 mg/g creat (ref 0–200)

## 2023-01-04 LAB — MAGNESIUM: Magnesium: 1.8 mg/dL (ref 1.6–2.3)

## 2023-01-04 NOTE — Assessment & Plan Note (Signed)
Established problem Well Controlled and is at goal of <140/90. No signs of complications, medication side effects, or red flags. Continue current medications and other regiments.  We discussed stopping daily aspirin or at least reducing how frequently she takes one for primary CVE prevention.

## 2023-01-04 NOTE — Assessment & Plan Note (Signed)
Established problem. Stable. She has not taken the prescribed Zofran.

## 2023-01-04 NOTE — Progress Notes (Signed)
[name] is alone Sources of clinical information for visit is/are patient. Nursing assessment for this office visit was reviewed with the patient for accuracy and revision.     Previous Report(s) Reviewed: GI consult Dr Adela Lank 11/14/22    01/03/2023   10:27 AM  Depression screen PHQ 2/9  Decreased Interest 2  Down, Depressed, Hopeless 1  PHQ - 2 Score 3  Altered sleeping 2  Tired, decreased energy 3  Change in appetite 3  Feeling bad or failure about yourself  1  Trouble concentrating 2  Moving slowly or fidgety/restless 0  Suicidal thoughts 0  PHQ-9 Score 14   Flowsheet Row Office Visit from 01/03/2023 in [facility] Office Visit from 10/25/2022 in [facility] Office Visit from 06/07/2022 in [facility]  Thoughts that you would be better off dead, or of hurting yourself in some way Not at all More than half the days More than half the days  PHQ-9 Total Score 01/03/2023   10:27 AM 10/25/2022    9:40 AM 06/07/2022   10:06 AM 10/05/2021   10:35 AM 02/02/2021    2:10 PM  Fall Risk   Falls in the past year? 0 0 0 0 1  Number falls in past yr: 0 0 0 0 0  Injury with Fall? 0 0 0 0 1       01/03/2023   10:27 AM 10/25/2022    9:40 AM 06/07/2022   11:11 AM  PHQ9 SCORE ONLY  PHQ-9 Total Score There are no preventive care reminders to display for this patient.  Health Maintenance Due  Topic Date Due   Zoster Vaccines- Shingrix (1 of 2) Never done   Medicare Annual Wellness (AWV)  03/14/2018      History/P.E. limitations: none  There are no preventive care reminders to display for this patient. There are no preventive care reminders to display for this patient.  Health Maintenance Due  Topic Date Due   Zoster Vaccines- Shingrix (1 of 2) Never done   Medicare Annual Wellness (AWV)  03/14/2018     No chief complaint on file.     --------------------------------------------------------------------------------------------------------------------------------------------- Visit Problem List with A/P  No problem-specific Assessment & Plan notes found for this encounter.  Comprehensive Metabolic Panel:    Component Value Date/Time   NA 140 01/03/2023 1146   K 3.6 01/03/2023 1146   CL 109 (H) 01/03/2023 1146   CO2 17 (L) 01/03/2023 1146   BUN 9 01/03/2023 1146   CREATININE 1.17 (H) 01/03/2023 1146   CREATININE 0.54 (L) 12/26/2015 1156   GLUCOSE 85 01/03/2023 1146   GLUCOSE 99 02/16/2020 2117   CALCIUM 9.4 01/03/2023 1146   AST 14 10/05/2022 1310   ALT 8 10/05/2022 1310   ALKPHOS 96 10/05/2022 1310   BILITOT 0.4 10/05/2022 1310   PROT 6.8 10/05/2022 1310   ALBUMIN 3.6 (L) 01/03/2023 1146   CBC:    Component Value Date/Time   WBC 5.7 01/03/2023 1146   WBC 5.3 02/16/2020 2117   HGB 8.8 (L) 01/03/2023 1146   HCT 26.7 (L) 01/03/2023 1146   PLT 425 01/03/2023 1146   MCV 92 01/03/2023 1146   NEUTROABS 3.5 01/03/2023 1146   LYMPHSABS 1.4 01/03/2023 1146   MONOABS 0.6 05/20/2019 1103   EOSABS 0.2 01/03/2023 1146   BASOSABS 0.0 01/03/2023 1146

## 2023-01-04 NOTE — Assessment & Plan Note (Signed)
Established problem [name] continues to take the Repatha without adverse effects.  She plans to continue taking it.

## 2023-01-04 NOTE — Assessment & Plan Note (Signed)
Established problem. Stable. She is taking care of a grandchild.  She took only one dose of mirtazapine 15 mg.  "It made me feel bad." [name] is not certain what what the bad feeling was.   She reports having come to terms with her sister's death last 10/12/2023.

## 2023-01-04 NOTE — Telephone Encounter (Signed)
Discussed slight low phosphorus She says she can increase the amount of dairy and nuts in her daily diet. Will recheck on next office visit.

## 2023-01-04 NOTE — Assessment & Plan Note (Addendum)
Wt Readings from Last 3 Encounters:  01/03/23 143 lb 8 oz (65.1 kg)  11/14/22 143 lb (64.9 kg)  10/25/22 151 lb 6 oz (68.7 kg)  Body mass index is 27.11 kg/m. Stable weight since end of February She did not tolerate the mirtazapine trial by Dr Adela Lank.  She did not take the prescribed zofran.  Eating more now that she is eating with her granddgt, but still eating less because lack of appetitie. Mood and energy at usual baseline She is only able to drink about half a container of boost a day.  Usual diarrhea amounts and frequency - no increases from baseline levels No fever/chills/night sweats No difficulty chewing or swallowing No financial constraint around foods No cough/abdominal pain/dysuria  [name] has twice canceled the CT AP recommended by Dr Adela Lank at the end of April.   Our office helped her  reschedule the recommended CT during today's office visit .   Labs: Albumin 3.6 slightly low.  Low Bicarb presumed secondary to chronic diarrhea from collagenous colitis.  If unremarkable work-up, then will refer to Dr Gerilyn Pilgrim (Nutrition) for evaluation and recommendations

## 2023-01-04 NOTE — Assessment & Plan Note (Signed)
Lab Results  Component Value Date   CREATININE 1.17 (H) 01/03/2023  Around baseline 1.1-1.2 serum creatinine  Will check UAC ratio next office visit

## 2023-01-04 NOTE — Assessment & Plan Note (Addendum)
Established problem BP controlled, Taking Repatha, no smoking Discussed related goals from AWV

## 2023-01-04 NOTE — Assessment & Plan Note (Addendum)
Further decline in Hemoglobin.  Asymptomatic Hbb 8.8 today (9.8 on 10/25/22, 10.9 on 10/05/22) MCV and RDW within normal limits.  No other cell line involvement Ferritin on 10/25/22 was 88 Retic Count 1.9%  Last B12 > 1000 in 2021 Normal TSH 09/2022  Will recheck Vit B12 and check serum folate at next office visit  Continue to monitor for now.  Await CT AP

## 2023-01-04 NOTE — Assessment & Plan Note (Signed)
Resolved

## 2023-01-08 ENCOUNTER — Other Ambulatory Visit: Payer: Self-pay | Admitting: Family Medicine

## 2023-01-08 ENCOUNTER — Ambulatory Visit: Payer: Medicare Other

## 2023-01-08 DIAGNOSIS — Z1231 Encounter for screening mammogram for malignant neoplasm of breast: Secondary | ICD-10-CM

## 2023-01-15 ENCOUNTER — Ambulatory Visit: Payer: Medicare Other

## 2023-01-18 ENCOUNTER — Ambulatory Visit: Payer: Medicare Other

## 2023-01-23 ENCOUNTER — Other Ambulatory Visit: Payer: Medicare Other

## 2023-01-24 ENCOUNTER — Ambulatory Visit
Admission: RE | Admit: 2023-01-24 | Discharge: 2023-01-24 | Disposition: A | Discharge status: Home / Self Care | Payer: Medicare Other | Source: Ambulatory Visit | Attending: Gastroenterology | Admitting: Gastroenterology

## 2023-01-24 DIAGNOSIS — T753XXS Motion sickness, sequela: Secondary | ICD-10-CM | POA: Insufficient documentation

## 2023-01-24 DIAGNOSIS — K317 Polyp of stomach and duodenum: Secondary | ICD-10-CM | POA: Insufficient documentation

## 2023-01-24 DIAGNOSIS — K52831 Collagenous colitis: Secondary | ICD-10-CM | POA: Insufficient documentation

## 2023-01-24 DIAGNOSIS — K3189 Other diseases of stomach and duodenum: Secondary | ICD-10-CM | POA: Diagnosis not present

## 2023-01-24 DIAGNOSIS — R630 Anorexia: Secondary | ICD-10-CM | POA: Diagnosis present

## 2023-01-24 DIAGNOSIS — R935 Abnormal findings on diagnostic imaging of other abdominal regions, including retroperitoneum: Secondary | ICD-10-CM | POA: Insufficient documentation

## 2023-01-24 DIAGNOSIS — R634 Abnormal weight loss: Secondary | ICD-10-CM | POA: Insufficient documentation

## 2023-01-25 ENCOUNTER — Telehealth: Payer: Self-pay

## 2023-01-25 NOTE — Telephone Encounter (Signed)
Patient calls nurse line requesting to speak with Dr. McDiarmid. Advised that PCP is out of the office today and asked if there was anything I could do to help her.   She states that she just needs to speak with Dr. McDiarmid regarding continued issues with her health and results of CT scan.   She states that this is not an urgent matter and can wait to speak with Dr. McDiarmid once he is back in the office.   Requesting returned call at [phone].  Veronda Prude, RN

## 2023-01-28 ENCOUNTER — Other Ambulatory Visit: Payer: Self-pay

## 2023-01-28 ENCOUNTER — Inpatient Hospital Stay
Admission: EM | Admit: 2023-01-28 | Discharge: 2023-02-01 | DRG: 683 | Disposition: A | Discharge status: Home Health | Payer: Medicare Other | Attending: Internal Medicine | Admitting: Internal Medicine

## 2023-01-28 DIAGNOSIS — K52831 Collagenous colitis: Secondary | ICD-10-CM | POA: Diagnosis not present

## 2023-01-28 DIAGNOSIS — R54 Age-related physical debility: Secondary | ICD-10-CM | POA: Diagnosis not present

## 2023-01-28 DIAGNOSIS — Z823 Family history of stroke: Secondary | ICD-10-CM

## 2023-01-28 DIAGNOSIS — Z91013 Allergy to seafood: Secondary | ICD-10-CM | POA: Diagnosis not present

## 2023-01-28 DIAGNOSIS — Z79899 Other long term (current) drug therapy: Secondary | ICD-10-CM

## 2023-01-28 DIAGNOSIS — D631 Anemia in chronic kidney disease: Secondary | ICD-10-CM | POA: Diagnosis not present

## 2023-01-28 DIAGNOSIS — M8580 Other specified disorders of bone density and structure, unspecified site: Secondary | ICD-10-CM | POA: Diagnosis not present

## 2023-01-28 DIAGNOSIS — N179 Acute kidney failure, unspecified: Secondary | ICD-10-CM | POA: Diagnosis not present

## 2023-01-28 DIAGNOSIS — Z885 Allergy status to narcotic agent status: Secondary | ICD-10-CM | POA: Diagnosis not present

## 2023-01-28 DIAGNOSIS — E7800 Pure hypercholesterolemia, unspecified: Secondary | ICD-10-CM | POA: Diagnosis present

## 2023-01-28 DIAGNOSIS — E440 Moderate protein-calorie malnutrition: Secondary | ICD-10-CM | POA: Diagnosis not present

## 2023-01-28 DIAGNOSIS — E871 Hypo-osmolality and hyponatremia: Secondary | ICD-10-CM | POA: Diagnosis present

## 2023-01-28 DIAGNOSIS — D75839 Thrombocytosis, unspecified: Secondary | ICD-10-CM | POA: Diagnosis not present

## 2023-01-28 DIAGNOSIS — Z830 Family history of human immunodeficiency virus [HIV] disease: Secondary | ICD-10-CM

## 2023-01-28 DIAGNOSIS — Z91041 Radiographic dye allergy status: Secondary | ICD-10-CM

## 2023-01-28 DIAGNOSIS — I10 Essential (primary) hypertension: Secondary | ICD-10-CM | POA: Diagnosis present

## 2023-01-28 DIAGNOSIS — Z6823 Body mass index (BMI) 23.0-23.9, adult: Secondary | ICD-10-CM

## 2023-01-28 DIAGNOSIS — Z888 Allergy status to other drugs, medicaments and biological substances status: Secondary | ICD-10-CM

## 2023-01-28 DIAGNOSIS — I129 Hypertensive chronic kidney disease with stage 1 through stage 4 chronic kidney disease, or unspecified chronic kidney disease: Secondary | ICD-10-CM | POA: Diagnosis present

## 2023-01-28 DIAGNOSIS — I2510 Atherosclerotic heart disease of native coronary artery without angina pectoris: Secondary | ICD-10-CM | POA: Diagnosis present

## 2023-01-28 DIAGNOSIS — E860 Dehydration: Secondary | ICD-10-CM | POA: Diagnosis not present

## 2023-01-28 DIAGNOSIS — K317 Polyp of stomach and duodenum: Secondary | ICD-10-CM | POA: Diagnosis not present

## 2023-01-28 DIAGNOSIS — N1831 Chronic kidney disease, stage 3a: Secondary | ICD-10-CM | POA: Diagnosis not present

## 2023-01-28 DIAGNOSIS — E876 Hypokalemia: Secondary | ICD-10-CM | POA: Diagnosis not present

## 2023-01-28 DIAGNOSIS — Z881 Allergy status to other antibiotic agents status: Secondary | ICD-10-CM | POA: Diagnosis not present

## 2023-01-28 DIAGNOSIS — K529 Noninfective gastroenteritis and colitis, unspecified: Secondary | ICD-10-CM | POA: Diagnosis not present

## 2023-01-28 DIAGNOSIS — Z7982 Long term (current) use of aspirin: Secondary | ICD-10-CM

## 2023-01-28 DIAGNOSIS — Z818 Family history of other mental and behavioral disorders: Secondary | ICD-10-CM

## 2023-01-28 DIAGNOSIS — R531 Weakness: Secondary | ICD-10-CM

## 2023-01-28 DIAGNOSIS — Z8719 Personal history of other diseases of the digestive system: Secondary | ICD-10-CM

## 2023-01-28 DIAGNOSIS — E46 Unspecified protein-calorie malnutrition: Secondary | ICD-10-CM | POA: Diagnosis present

## 2023-01-28 DIAGNOSIS — Z833 Family history of diabetes mellitus: Secondary | ICD-10-CM

## 2023-01-28 DIAGNOSIS — Z86010 Personal history of colonic polyps: Secondary | ICD-10-CM

## 2023-01-28 DIAGNOSIS — Z803 Family history of malignant neoplasm of breast: Secondary | ICD-10-CM

## 2023-01-28 DIAGNOSIS — Z8249 Family history of ischemic heart disease and other diseases of the circulatory system: Secondary | ICD-10-CM | POA: Diagnosis not present

## 2023-01-28 DIAGNOSIS — D573 Sickle-cell trait: Secondary | ICD-10-CM | POA: Diagnosis present

## 2023-01-28 DIAGNOSIS — R634 Abnormal weight loss: Secondary | ICD-10-CM | POA: Diagnosis not present

## 2023-01-28 DIAGNOSIS — Z531 Procedure and treatment not carried out because of patient's decision for reasons of belief and group pressure: Secondary | ICD-10-CM | POA: Diagnosis not present

## 2023-01-28 DIAGNOSIS — Z90711 Acquired absence of uterus with remaining cervical stump: Secondary | ICD-10-CM

## 2023-01-28 DIAGNOSIS — R638 Other symptoms and signs concerning food and fluid intake: Secondary | ICD-10-CM

## 2023-01-28 DIAGNOSIS — Z880 Allergy status to penicillin: Secondary | ICD-10-CM

## 2023-01-28 DIAGNOSIS — K219 Gastro-esophageal reflux disease without esophagitis: Secondary | ICD-10-CM | POA: Diagnosis present

## 2023-01-28 DIAGNOSIS — K589 Irritable bowel syndrome without diarrhea: Secondary | ICD-10-CM

## 2023-01-28 DIAGNOSIS — Z825 Family history of asthma and other chronic lower respiratory diseases: Secondary | ICD-10-CM

## 2023-01-28 DIAGNOSIS — D649 Anemia, unspecified: Secondary | ICD-10-CM | POA: Diagnosis present

## 2023-01-28 DIAGNOSIS — Z800 Family history of malignant neoplasm of digestive organs: Secondary | ICD-10-CM

## 2023-01-28 DIAGNOSIS — Z822 Family history of deafness and hearing loss: Secondary | ICD-10-CM

## 2023-01-28 LAB — URINALYSIS, ROUTINE W REFLEX MICROSCOPIC
Glucose, UA: NEGATIVE mg/dL
Hgb urine dipstick: NEGATIVE
Ketones, ur: NEGATIVE mg/dL
Nitrite: NEGATIVE
Protein, ur: NEGATIVE mg/dL
Specific Gravity, Urine: 1.02 (ref 1.005–1.030)
pH: 5 (ref 5.0–8.0)

## 2023-01-28 LAB — URINALYSIS, MICROSCOPIC (REFLEX): Bacteria, UA: NONE SEEN

## 2023-01-28 LAB — BASIC METABOLIC PANEL
Anion gap: 12 (ref 5–15)
BUN: 50 mg/dL — ABNORMAL HIGH (ref 8–23)
CO2: 20 mmol/L — ABNORMAL LOW (ref 22–32)
Calcium: 9.8 mg/dL (ref 8.9–10.3)
Chloride: 99 mmol/L (ref 98–111)
Creatinine, Ser: 3.08 mg/dL — ABNORMAL HIGH (ref 0.44–1.00)
GFR, Estimated: 15 mL/min — ABNORMAL LOW (ref >60)
Glucose, Bld: 115 mg/dL — ABNORMAL HIGH (ref 70–99)
Potassium: 3.3 mmol/L — ABNORMAL LOW (ref 3.5–5.1)
Sodium: 131 mmol/L — ABNORMAL LOW (ref 135–145)

## 2023-01-28 LAB — CBC
HCT: 33.1 % — ABNORMAL LOW (ref 36.0–46.0)
Hemoglobin: 11.2 g/dL — ABNORMAL LOW (ref 12.0–15.0)
MCH: 30.4 pg (ref 26.0–34.0)
MCHC: 33.8 g/dL (ref 30.0–36.0)
MCV: 89.9 fL (ref 80.0–100.0)
Platelets: 605 10*3/uL — ABNORMAL HIGH (ref 150–400)
RBC: 3.68 MIL/uL — ABNORMAL LOW (ref 3.87–5.11)
RDW: 12.1 % (ref 11.5–15.5)
WBC: 7.8 10*3/uL (ref 4.0–10.5)
nRBC: 0 % (ref 0.0–0.2)

## 2023-01-28 NOTE — ED Triage Notes (Signed)
Pt to ED for progressive weakness since 2-3 months ago along with poor appetite and weight loss.  States since then has felt off balance at times but has not fallen. Pt ambulatory with steady gait but walking slow.  States lost 10# in the past week. Hx colitis, has chronic diarrhea. Denies blood in stools. Had cold last week.  Extensive medical hx.  [employer] and does not accept blood products.

## 2023-01-29 ENCOUNTER — Encounter: Payer: Self-pay | Admitting: Family Medicine

## 2023-01-29 ENCOUNTER — Other Ambulatory Visit: Payer: Self-pay

## 2023-01-29 DIAGNOSIS — I129 Hypertensive chronic kidney disease with stage 1 through stage 4 chronic kidney disease, or unspecified chronic kidney disease: Secondary | ICD-10-CM | POA: Diagnosis present

## 2023-01-29 DIAGNOSIS — N1831 Chronic kidney disease, stage 3a: Secondary | ICD-10-CM

## 2023-01-29 DIAGNOSIS — E871 Hypo-osmolality and hyponatremia: Secondary | ICD-10-CM | POA: Diagnosis present

## 2023-01-29 DIAGNOSIS — Z531 Procedure and treatment not carried out because of patient's decision for reasons of belief and group pressure: Secondary | ICD-10-CM | POA: Diagnosis present

## 2023-01-29 DIAGNOSIS — N281 Cyst of kidney, acquired: Secondary | ICD-10-CM | POA: Diagnosis not present

## 2023-01-29 DIAGNOSIS — E440 Moderate protein-calorie malnutrition: Secondary | ICD-10-CM

## 2023-01-29 DIAGNOSIS — R634 Abnormal weight loss: Secondary | ICD-10-CM

## 2023-01-29 DIAGNOSIS — Z881 Allergy status to other antibiotic agents status: Secondary | ICD-10-CM | POA: Diagnosis not present

## 2023-01-29 DIAGNOSIS — M8580 Other specified disorders of bone density and structure, unspecified site: Secondary | ICD-10-CM | POA: Diagnosis present

## 2023-01-29 DIAGNOSIS — D75839 Thrombocytosis, unspecified: Secondary | ICD-10-CM | POA: Diagnosis present

## 2023-01-29 DIAGNOSIS — K52831 Collagenous colitis: Secondary | ICD-10-CM | POA: Diagnosis not present

## 2023-01-29 DIAGNOSIS — I2510 Atherosclerotic heart disease of native coronary artery without angina pectoris: Secondary | ICD-10-CM | POA: Diagnosis present

## 2023-01-29 DIAGNOSIS — R638 Other symptoms and signs concerning food and fluid intake: Secondary | ICD-10-CM | POA: Diagnosis not present

## 2023-01-29 DIAGNOSIS — E7800 Pure hypercholesterolemia, unspecified: Secondary | ICD-10-CM | POA: Diagnosis present

## 2023-01-29 DIAGNOSIS — Z6823 Body mass index (BMI) 23.0-23.9, adult: Secondary | ICD-10-CM | POA: Diagnosis not present

## 2023-01-29 DIAGNOSIS — Z885 Allergy status to narcotic agent status: Secondary | ICD-10-CM | POA: Diagnosis not present

## 2023-01-29 DIAGNOSIS — I10 Essential (primary) hypertension: Secondary | ICD-10-CM | POA: Diagnosis not present

## 2023-01-29 DIAGNOSIS — E860 Dehydration: Secondary | ICD-10-CM | POA: Diagnosis present

## 2023-01-29 DIAGNOSIS — N179 Acute kidney failure, unspecified: Secondary | ICD-10-CM | POA: Diagnosis not present

## 2023-01-29 DIAGNOSIS — Z91041 Radiographic dye allergy status: Secondary | ICD-10-CM | POA: Diagnosis not present

## 2023-01-29 DIAGNOSIS — Z888 Allergy status to other drugs, medicaments and biological substances status: Secondary | ICD-10-CM | POA: Diagnosis not present

## 2023-01-29 DIAGNOSIS — E46 Unspecified protein-calorie malnutrition: Secondary | ICD-10-CM | POA: Diagnosis present

## 2023-01-29 DIAGNOSIS — Z8249 Family history of ischemic heart disease and other diseases of the circulatory system: Secondary | ICD-10-CM | POA: Diagnosis not present

## 2023-01-29 DIAGNOSIS — K529 Noninfective gastroenteritis and colitis, unspecified: Secondary | ICD-10-CM | POA: Diagnosis present

## 2023-01-29 DIAGNOSIS — D631 Anemia in chronic kidney disease: Secondary | ICD-10-CM | POA: Diagnosis present

## 2023-01-29 DIAGNOSIS — E876 Hypokalemia: Secondary | ICD-10-CM | POA: Diagnosis not present

## 2023-01-29 DIAGNOSIS — Z91013 Allergy to seafood: Secondary | ICD-10-CM | POA: Diagnosis not present

## 2023-01-29 DIAGNOSIS — R54 Age-related physical debility: Secondary | ICD-10-CM | POA: Diagnosis present

## 2023-01-29 DIAGNOSIS — K317 Polyp of stomach and duodenum: Secondary | ICD-10-CM | POA: Diagnosis present

## 2023-01-29 HISTORY — DX: Acute kidney failure, unspecified: N17.9

## 2023-01-29 LAB — BASIC METABOLIC PANEL
Anion gap: 11 (ref 5–15)
BUN: 39 mg/dL — ABNORMAL HIGH (ref 8–23)
CO2: 17 mmol/L — ABNORMAL LOW (ref 22–32)
Calcium: 8.7 mg/dL — ABNORMAL LOW (ref 8.9–10.3)
Chloride: 106 mmol/L (ref 98–111)
Creatinine, Ser: 2.44 mg/dL — ABNORMAL HIGH (ref 0.44–1.00)
GFR, Estimated: 20 mL/min — ABNORMAL LOW (ref >60)
Glucose, Bld: 115 mg/dL — ABNORMAL HIGH (ref 70–99)
Potassium: 3.7 mmol/L (ref 3.5–5.1)
Sodium: 134 mmol/L — ABNORMAL LOW (ref 135–145)

## 2023-01-29 LAB — CBC
HCT: 25.9 % — ABNORMAL LOW (ref 36.0–46.0)
Hemoglobin: 9 g/dL — ABNORMAL LOW (ref 12.0–15.0)
MCH: 31 pg (ref 26.0–34.0)
MCHC: 34.7 g/dL (ref 30.0–36.0)
MCV: 89.3 fL (ref 80.0–100.0)
Platelets: 406 10*3/uL — ABNORMAL HIGH (ref 150–400)
RBC: 2.9 MIL/uL — ABNORMAL LOW (ref 3.87–5.11)
RDW: 12 % (ref 11.5–15.5)
WBC: 5.7 10*3/uL (ref 4.0–10.5)
nRBC: 0 % (ref 0.0–0.2)

## 2023-01-29 MED ORDER — SODIUM CHLORIDE 0.9 % IV SOLN: INTRAVENOUS | Status: AC

## 2023-01-29 MED ORDER — LACTATED RINGERS IV BOLUS
1000.0000 mL | Freq: Once | INTRAVENOUS | Status: AC
  Administered 2023-01-29: 1000 mL via INTRAVENOUS

## 2023-01-29 MED ORDER — LOPERAMIDE HCL 2 MG PO CAPS: 2.0000 mg | ORAL_CAPSULE | Freq: Every day | ORAL | Status: DC | PRN

## 2023-01-29 MED ORDER — ONDANSETRON HCL 4 MG/2ML IJ SOLN: 4.0000 mg | Freq: Four times a day (QID) | INTRAMUSCULAR | Status: DC | PRN

## 2023-01-29 MED ORDER — ACETAMINOPHEN 325 MG PO TABS
650.0000 mg | ORAL_TABLET | Freq: Four times a day (QID) | ORAL | Status: DC | PRN
  Administered 2023-01-29: 650 mg via ORAL
  Filled 2023-01-29: qty 2

## 2023-01-29 MED ORDER — PANTOPRAZOLE SODIUM 40 MG IV SOLR
40.0000 mg | Freq: Every day | INTRAVENOUS | Status: DC
  Administered 2023-01-29 – 2023-02-01 (×4): 40 mg via INTRAVENOUS
  Filled 2023-01-29 (×4): qty 10

## 2023-01-29 MED ORDER — SODIUM CHLORIDE 0.9 % IV SOLN: INTRAVENOUS | Status: DC

## 2023-01-29 MED ORDER — ACETAMINOPHEN 650 MG RE SUPP: 650.0000 mg | Freq: Four times a day (QID) | RECTAL | Status: DC | PRN

## 2023-01-29 MED ORDER — ONDANSETRON HCL 4 MG PO TABS: 4.0000 mg | ORAL_TABLET | Freq: Four times a day (QID) | ORAL | Status: DC | PRN

## 2023-01-29 MED ORDER — METOPROLOL SUCCINATE ER 25 MG PO TB24
25.0000 mg | ORAL_TABLET | Freq: Every day | ORAL | Status: DC
  Administered 2023-01-29 – 2023-01-31 (×3): 25 mg via ORAL
  Filled 2023-01-29 (×3): qty 1

## 2023-01-29 MED ORDER — POTASSIUM CHLORIDE CRYS ER 20 MEQ PO TBCR
20.0000 meq | EXTENDED_RELEASE_TABLET | Freq: Two times a day (BID) | ORAL | Status: DC
  Administered 2023-01-29 – 2023-01-30 (×4): 20 meq via ORAL
  Filled 2023-01-29 (×4): qty 1

## 2023-01-29 MED ORDER — ENOXAPARIN SODIUM 30 MG/0.3ML IJ SOSY
30.0000 mg | PREFILLED_SYRINGE | INTRAMUSCULAR | Status: DC
  Administered 2023-01-29 – 2023-01-30 (×2): 30 mg via SUBCUTANEOUS
  Filled 2023-01-29 (×2): qty 0.3

## 2023-01-29 MED ORDER — METOPROLOL SUCCINATE ER 50 MG PO TB24
50.0000 mg | ORAL_TABLET | Freq: Every day | ORAL | Status: DC
  Administered 2023-01-29 – 2023-02-01 (×4): 50 mg via ORAL
  Filled 2023-01-29 (×4): qty 1

## 2023-01-29 MED ORDER — METOPROLOL SUCCINATE ER 25 MG PO TB24: 12.5000 mg | ORAL_TABLET | Freq: Every day | ORAL | Status: DC

## 2023-01-29 NOTE — Assessment & Plan Note (Signed)
Stable to slightly hemoconcentrated from baseline

## 2023-01-29 NOTE — Assessment & Plan Note (Signed)
Chronic and stable Continue loperamide

## 2023-01-29 NOTE — Assessment & Plan Note (Signed)
[employer]

## 2023-01-29 NOTE — Final Progress Note (Addendum)
Same day rounding progress note  Patient seen and examined in the ED.  Agree with assessment and plan dictated in Dr. Lianne Bushy dictated history and physical.  Unintentional weight loss Normocytic anemia Collagenous colitis Protein calorie malnutrition Inadequate oral intake GI consult -discussed with Dr. Tobi Bastos  AKI Likely prerenal,, continue IV fluid and monitor renal function, avoid nephrotoxic medications  Time spent: 20 minutes

## 2023-01-29 NOTE — Assessment & Plan Note (Signed)
-   Appears chronic °

## 2023-01-29 NOTE — Assessment & Plan Note (Signed)
Creatinine above 3, baseline 1.17, improving with IV fluid, currently at 2.03 -Check renal ultrasound -Hydrate and monitor

## 2023-01-29 NOTE — Assessment & Plan Note (Signed)
History of chronic gastritis IV Protonix GI consult

## 2023-01-29 NOTE — Telephone Encounter (Signed)
Ms Maggie Font was admitted today at [facility] for AKI.

## 2023-01-29 NOTE — Assessment & Plan Note (Signed)
Potassium at 3.2 today.  Normal magnesium and phosphorus -Replete potassium and monitor

## 2023-01-29 NOTE — Assessment & Plan Note (Addendum)
Likely secondary to dehydration, resolved with IV fluid Continue to monitor

## 2023-01-29 NOTE — Assessment & Plan Note (Addendum)
Unintentional weight loss, acute on chronic Protein calorie malnutrition, unspecified IV hydration Nutritionist consult GI to repeat EGD and colonoscopy tomorrow

## 2023-01-29 NOTE — ED Provider Notes (Signed)
[facility] Provider Note    Event Date/Time   First MD Initiated Contact with Patient 01/28/23 2346     (approximate)   History   Weakness, poor appetite, and Sore Throat   HPI [name] is a 77 y.o. female who presents for evaluation of generalized weakness, poor appetite, and progressive decline over the last couple of months.  She said that she has not had any falls but she feels off balance occasionally.  She says that she has had unexplained weight loss.  No recent fever chills, chest pain, nor shortness of breath.  She has had chronic colitis "since the 90s", but no acute abnormalities.  The main issue she is experiencing is that she said that everything she eats or drinks tastes funny and she does not have any appetite, so as a result, her daughter confirmed that it is a challenge to get her to eat or drink much of anything.       Physical Exam   Triage Vital Signs: ED Triage Vitals  Enc Vitals Group     BP 01/28/23 1517 124/76     Pulse Rate 01/28/23 1517 95     Resp 01/28/23 1517 20     Temp 01/28/23 1519 97.8 F (36.6 C)     Temp Source 01/28/23 1517 Oral     SpO2 01/28/23 1517 97 %     Weight 01/28/23 1518 56.9 kg (125 lb 6.4 oz)     Height 01/28/23 1518 1.549 m (5\' 1" )     Head Circumference --      Peak Flow --      Pain Score 01/28/23 1518 0     Pain Loc --      Pain Edu? --      Excl. in GC? --     Most recent vital signs: Vitals:   01/29/23 0730 01/29/23 0732  BP:  107/61  Pulse: 81 85  Resp: 16 16  Temp:    SpO2: 97% 96%    General: Awake, elderly but awake and conversant.  Not in pain at this time. CV:  Good peripheral perfusion.  Easily palpable radial pulse.  Regular rate and rhythm. Resp:  Normal effort. Speaking easily and comfortably, no accessory muscle usage nor intercostal retractions.   Abd:  No distention.  No tenderness to palpation of her abdomen. Other:  Mood and affect are normal for the  circumstances.   ED Results / Procedures / Treatments   Labs (all labs ordered are listed, but only abnormal results are displayed) Labs Reviewed  BASIC METABOLIC PANEL - Abnormal; Notable for the following components:      Result Value   Sodium 131 (*)    Potassium 3.3 (*)    CO2 20 (*)    Glucose, Bld 115 (*)    BUN 50 (*)    Creatinine, Ser 3.08 (*)    GFR, Estimated 15 (*)    All other components within normal limits  CBC - Abnormal; Notable for the following components:   RBC 3.68 (*)    Hemoglobin 11.2 (*)    HCT 33.1 (*)    Platelets 605 (*)    All other components within normal limits  URINALYSIS, ROUTINE W REFLEX MICROSCOPIC - Abnormal; Notable for the following components:   Bilirubin Urine SMALL (*)    Leukocytes,Ua TRACE (*)    All other components within normal limits  URINALYSIS, MICROSCOPIC (REFLEX)  BASIC METABOLIC PANEL  CBC  CBG MONITORING, ED     EKG  ED ECG REPORT I, Loleta Rose, the attending physician, personally viewed and interpreted this ECG.  Date: 01/28/2023 EKG Time: 15: 24 Rate: 96 Rhythm: normal sinus rhythm QRS Axis: normal Intervals: normal ST/T Wave abnormalities: Non-specific ST segment / T-wave changes, but no clear evidence of acute ischemia. Narrative Interpretation: no definitive evidence of acute ischemia; does not meet STEMI criteria.    PROCEDURES:  Critical Care performed: No  Procedures    IMPRESSION / MDM / ASSESSMENT AND PLAN / ED COURSE  I reviewed the triage vital signs and the nursing notes.                              Differential diagnosis includes, but is not limited to, failure to thrive, acute on chronic colitis, diverticulitis, metabolic or electrolyte abnormality, acute kidney injury or renal failure.  Patient's presentation is most consistent with acute presentation with potential threat to life or bodily function.  Labs/studies ordered: Urinalysis, BMP, CBC, EKG  Interventions/Medications  given:  LR 1 L IV bolus  (Note:  hospital course my include additional interventions and/or labs/studies not listed above.)  Patient's vital signs are stable and there is no sign that she has an infectious process.  Her physical exam is reassuring and she is not in pain.  However, I believe that as result of her losing her appetite over time, and saying that nothing tastes good or "right", she is now dehydrated to the point of causing acute renal failure with creatinine greater than 3.  Her platelets are also concentrated.  No obvious infection on her urine.  CO2 is down slightly as is her sodium.  I ordered LR 1 L IV bolus but explained to the patient and her daughter that I think she needs to stay in the hospital given her age and how this is more of a chronic process and is unlikely to fully resolve while in the emergency department.  They state that they understand and approve the plan.  I am consulting the hospitalist service for admission.   Clinical Course as of 01/29/23 0824  Tue Jan 29, 2023  0125 Consulted by phone with Dr. Para March with the hospitalist service.  She will admit the patient. [CF]    Clinical Course User Index [CF] Loleta Rose, MD     FINAL CLINICAL IMPRESSION(S) / ED DIAGNOSES   Final diagnoses:  Acute renal failure, unspecified acute renal failure type (HCC)  Generalized weakness  Chronic colitis  Irritable bowel syndrome, unspecified type     Rx / DC Orders   ED Discharge Orders     None        Note:  This document was prepared using Dragon voice recognition software and may include unintentional dictation errors.   Loleta Rose, MD 01/29/23 [phone]
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
## 2023-01-29 NOTE — Assessment & Plan Note (Signed)
Continue metoprolol.  Will hold spironolactone
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
## 2023-01-30 ENCOUNTER — Telehealth: Payer: Self-pay | Admitting: Gastroenterology

## 2023-01-30 ENCOUNTER — Inpatient Hospital Stay: Payer: Medicare Other

## 2023-01-30 DIAGNOSIS — E440 Moderate protein-calorie malnutrition: Secondary | ICD-10-CM | POA: Diagnosis not present

## 2023-01-30 DIAGNOSIS — R634 Abnormal weight loss: Secondary | ICD-10-CM | POA: Diagnosis not present

## 2023-01-30 DIAGNOSIS — E876 Hypokalemia: Secondary | ICD-10-CM

## 2023-01-30 DIAGNOSIS — N179 Acute kidney failure, unspecified: Secondary | ICD-10-CM | POA: Diagnosis not present

## 2023-01-30 DIAGNOSIS — K52831 Collagenous colitis: Secondary | ICD-10-CM | POA: Diagnosis not present

## 2023-01-30 DIAGNOSIS — R638 Other symptoms and signs concerning food and fluid intake: Secondary | ICD-10-CM | POA: Diagnosis not present

## 2023-01-30 DIAGNOSIS — I10 Essential (primary) hypertension: Secondary | ICD-10-CM

## 2023-01-30 LAB — BASIC METABOLIC PANEL
Anion gap: 6 (ref 5–15)
BUN: 33 mg/dL — ABNORMAL HIGH (ref 8–23)
CO2: 18 mmol/L — ABNORMAL LOW (ref 22–32)
Calcium: 8.7 mg/dL — ABNORMAL LOW (ref 8.9–10.3)
Chloride: 114 mmol/L — ABNORMAL HIGH (ref 98–111)
Creatinine, Ser: 2.03 mg/dL — ABNORMAL HIGH (ref 0.44–1.00)
GFR, Estimated: 25 mL/min — ABNORMAL LOW (ref >60)
Glucose, Bld: 103 mg/dL — ABNORMAL HIGH (ref 70–99)
Potassium: 3.2 mmol/L — ABNORMAL LOW (ref 3.5–5.1)
Sodium: 138 mmol/L (ref 135–145)

## 2023-01-30 LAB — CBC
HCT: 24.5 % — ABNORMAL LOW (ref 36.0–46.0)
Hemoglobin: 8.4 g/dL — ABNORMAL LOW (ref 12.0–15.0)
MCH: 30.5 pg (ref 26.0–34.0)
MCHC: 34.3 g/dL (ref 30.0–36.0)
MCV: 89.1 fL (ref 80.0–100.0)
Platelets: 404 10*3/uL — ABNORMAL HIGH (ref 150–400)
RBC: 2.75 MIL/uL — ABNORMAL LOW (ref 3.87–5.11)
RDW: 12.3 % (ref 11.5–15.5)
WBC: 5.9 10*3/uL (ref 4.0–10.5)
nRBC: 0 % (ref 0.0–0.2)

## 2023-01-30 LAB — PHOSPHORUS: Phosphorus: 3 mg/dL (ref 2.5–4.6)

## 2023-01-30 LAB — MAGNESIUM: Magnesium: 2 mg/dL (ref 1.7–2.4)

## 2023-01-30 LAB — TSH: TSH: 0.169 u[IU]/mL — ABNORMAL LOW (ref 0.350–4.500)

## 2023-01-30 MED ORDER — SODIUM CHLORIDE 0.9 % IV SOLN: INTRAVENOUS | Status: DC

## 2023-01-30 MED ORDER — PEG 3350-KCL-NA BICARB-NACL 420 G PO SOLR
4000.0000 mL | Freq: Once | ORAL | Status: AC
  Administered 2023-01-30: 4000 mL via ORAL
  Filled 2023-01-30: qty 4000

## 2023-01-30 MED ORDER — FE FUM-VIT C-VIT B12-FA 460-60-0.01-1 MG PO CAPS
1.0000 | ORAL_CAPSULE | Freq: Every day | ORAL | Status: DC
  Filled 2023-01-30 (×2): qty 1

## 2023-01-30 MED ORDER — MIRTAZAPINE 15 MG PO TBDP
15.0000 mg | ORAL_TABLET | Freq: Every day | ORAL | Status: DC
  Administered 2023-01-30 – 2023-01-31 (×2): 15 mg via ORAL
  Filled 2023-01-30 (×2): qty 1

## 2023-01-30 MED ORDER — LACTATED RINGERS IV SOLN: INTRAVENOUS | Status: AC

## 2023-01-30 MED ORDER — POTASSIUM CHLORIDE CRYS ER 20 MEQ PO TBCR: 40.0000 meq | EXTENDED_RELEASE_TABLET | Freq: Once | ORAL | Status: DC

## 2023-01-30 MED ORDER — POTASSIUM CHLORIDE CRYS ER 20 MEQ PO TBCR
40.0000 meq | EXTENDED_RELEASE_TABLET | Freq: Two times a day (BID) | ORAL | Status: DC
  Administered 2023-01-30 – 2023-01-31 (×3): 40 meq via ORAL
  Filled 2023-01-30 (×3): qty 2

## 2023-01-30 NOTE — Progress Notes (Signed)
[name] , MD [address], [address], [place], [place], [address] [address], [address], [place], [place], [address] Phone: [phone]  Fax: [phone]   [name] is being followed for weight loss  Day 1 of follow up   Subjective: No new issues , wishes to proceed with procedures tomorrow    Objective: Vital signs in last 24 hours: Vitals:   01/29/23 1556 01/29/23 1932 01/30/23 0427 01/30/23 0715  BP: (!) 106/53 (!) 112/57 105/63 (!) 112/57  Pulse: 88 83 79 87  Resp: 18 18 18 18   Temp: 98.8 F (37.1 C) 98 F (36.7 C) 97.6 F (36.4 C) 97.6 F (36.4 C)  TempSrc:   Oral Oral  SpO2: 100% 100% 100% 100%  Weight:      Height:       Weight change:   Intake/Output Summary (Last 24 hours) at 01/30/2023 0910 Last data filed at 01/30/2023 0518 Gross per 24 hour  Intake 1077.45 ml  Output --  Net 1077.45 ml     Exam: Heart:: Regular rate and rhythm Lungs: normal Abdomen: soft, nontender, normal bowel sounds   Lab Results: @LABTEST2 @ Micro Results: No results found for this or any previous visit (from the past 240 hour(s)). Studies/Results: No results found. Medications: I have reviewed the patient's current medications. Scheduled Meds:  enoxaparin (LOVENOX) injection  30 mg Subcutaneous Q24H   metoprolol succinate  50 mg Oral Q breakfast   And   metoprolol succinate  25 mg Oral Q supper   pantoprazole (PROTONIX) IV  40 mg Intravenous Q0600   potassium chloride SA  20 mEq Oral BID   Continuous Infusions:  sodium chloride Stopped (01/30/23 0810)   PRN Meds:.acetaminophen **OR** acetaminophen, loperamide, ondansetron **OR** ondansetron (ZOFRAN) IV   Assessment: Principal Problem:   Acute renal failure superimposed on stage 3a chronic kidney disease (HCC) Active Problems:   Collagenous colitis   Loss of weight   Hypokalemia   Essential hypertension   History of gastric polyp   Thrombocytosis   Normocytic anemia   Inadequate oral intake    Hyponatremia   Protein calorie malnutrition (HCC)   Unable to accept blood transfusions as patient is Jehovah's Witness   AKI (acute kidney injury) (HCC)  [name] is a 77 y.o. y/o female with a history of collagenous colitis and who follows with Dr Adela Lank and who has seen her recently for unintentional weight loss and he felt a part of the issue was related to psychological aspects.  Last colonoscopy was in 2019 and last upper endoscopy was inOctober 2023.  CT scan of the abdomen pelvis performed in showed diffuse wall thickening consistent with chronic colitis which she is known to have   Impression: As suggested by Dr. Adela Lank her primary GI her weight loss could be related to psychological issues.  She has had an upper endoscopy within the past 6 months.  CT scan of the abdomen does not show any other abnormality.   Plan 1.  Recommend looking for other reasons for unintentional weight loss such as any neoplasm in the chest, TSH is elevated ? Hyperthyroidism .    2. EGD+colonoscopy tomorrow to rule out any GI neoplasm.    I have discussed alternative options, risks & benefits,  which include, but are not limited to, bleeding, infection, perforation,respiratory complication & drug reaction.  The patient agrees with this plan & written consent will be obtained.  LOS: 1 day   [name], MD 01/30/2023, 9:10 AM

## 2023-01-30 NOTE — Assessment & Plan Note (Addendum)
Patient has unintentional weight loss.  Workup so far is negative.  Recent EGD at prior polypectomy site was negative. GI was consulted and patient will be going a repeat EGD and colonoscopy tomorrow. CT abdomen and pelvis without contrast was without any significant abnormality, did shows chronic colitis.  TSH low Will get benefit from CT chest, abdomen and pelvis with contrast once renal function are improved to rule out any underlying lesion -Started her on Remeron-does need to be titrated by PCP -Check thyroid panel

## 2023-01-30 NOTE — Telephone Encounter (Signed)
Inbound call from patient, urgently requesting to speak to a nurse or Dr. Adela Lank, patient states she is currently admitted in the hospital and is scheduled for a procedure tomorrow. Patient states she is unsure if she should proceed with procedure due to recent CT images and also conversation had yesterday with a nurse. Please advise.

## 2023-01-30 NOTE — Telephone Encounter (Signed)
Returned call to patient. Pt is scheduled for EGD and colon tomorrow at [facility] to rule out GI neoplasm for unintentional weight loss. Pt wanted to know if she should proceed, I explained to patient why they wanted to do this. I informed her that they will likely take biopsies of her colon at that time too since she has a history of colitis and has ongoing diarrhea. I told pt that I would make you aware so that you can stay in the loop. Pt had no concerns at the end of the call.

## 2023-01-30 NOTE — Progress Notes (Signed)
Progress Note   Patient: [name]:[id number] DOB: 1945-11-06 DOA: 01/28/2023     1 DOS: the patient was seen and examined on 01/30/2023   Brief hospital course: Taken from H&P.  DEVONE DUGGAR is a 77 y.o. 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  It is associated with generalized weakness.  Patient continues to have poor appetite and poor oral intake.  She has chronic diarrhea that is no worse than her baseline.   ED course and data review: Vitals within normal limits.  Labs notable for creatinine of 3.08 up from baseline of 1.17 with bicarb of 20 and potassium 3.3.  Sodium 131.  Hemoglobin 11.2 which is improved from baseline EKG. showing NSR at 96 with nonspecific ST-T wave changes CT abdomen and pelvis consistent with chronic colitis. Patient hydrated with a 1 L LR bolus.  5/15: Vital stable.  Labs with improvement of creatinine to 2.03, hypokalemia with potassium of 3.2.  Checking TSH to rule out other options for excessive weight loss. Per GI note recent EGD and biopsies of polypectomy sites were benign. Will get benefit from CT chest abdomen and pelvis with contrast once renal function improved. Going for repeat EGD and colonoscopy tomorrow.  Also started on Remeron-does need to be titrated by PCP.    Assessment and Plan: * Acute renal failure superimposed on stage 3a chronic kidney disease (HCC) Creatinine above 3, baseline 1.17, improving with IV fluid, currently at 2.03 -Check renal ultrasound -Hydrate and monitor  Inadequate oral intake Unintentional weight loss, acute on chronic Protein calorie malnutrition, unspecified IV  hydration Nutritionist consult GI to repeat EGD and colonoscopy tomorrow  Loss of weight Patient has unintentional weight loss.  Workup so far is negative.  Recent EGD at prior polypectomy site was negative. GI was consulted and patient will be going a repeat EGD and colonoscopy tomorrow. CT abdomen and pelvis without contrast was without any significant abnormality, did shows chronic colitis.  TSH low Will get benefit from CT chest, abdomen and pelvis with contrast once renal function are improved to rule out any underlying lesion -Started her on Remeron-does need to be titrated by PCP -Check thyroid panel  Protein calorie malnutrition (HCC) Estimated body mass index is 23.69 kg/m as calculated from the following:   Height as of this encounter: 5\' 1"  (1.549 m).   Weight as of this encounter: 56.9 kg.   -Dietitian consult  Collagenous colitis Chronic and stable Continue loperamide  Hypokalemia Potassium at 3.2 today.  Normal magnesium and phosphorus -Replete potassium and monitor  Essential hypertension Continue metoprolol.  Will hold spironolactone  History of gastric polyp History of chronic gastritis IV Protonix GI consult  Thrombocytosis Appears chronic  Unable to accept blood transfusions as patient is Jehovah's Witness Jehovah's Witness  Normocytic anemia Appears stable, slight decrease in hemoglobin most likely dilutional effect. -Monitor hemoglobin  Hyponatremia Likely secondary to dehydration, resolved with IV fluid Continue to monitor   Subjective: Patient was feeling little nauseated after eating breakfast with no vomiting.  Agrees to start Remeron once daughter talked with her.  Physical Exam: Vitals:   01/29/23 1556 01/29/23 1932 01/30/23 0427 01/30/23 0715  BP: Marland Kitchen)  106/53 (!) 112/57 105/63 (!) 112/57  Pulse: 88 83 79 87  Resp: 18 18 18 18   Temp: 98.8 F (37.1 C) 98 F (36.7 C) 97.6 F (36.4 C) 97.6 F (36.4 C)  TempSrc:   Oral Oral  SpO2:  100% 100% 100% 100%  Weight:      Height:       General.  Well-developed elderly lady, in no acute distress. Pulmonary.  Lungs clear bilaterally, normal respiratory effort. CV.  Regular rate and rhythm, no JVD, rub or murmur. Abdomen.  Soft, nontender, nondistended, BS positive. CNS.  Alert and oriented .  No focal neurologic deficit. Extremities.  No edema, no cyanosis, pulses intact and symmetrical. Psychiatry.  Judgment and insight appears normal.   Data Reviewed: Prior data reviewed  Family Communication: Discussed with daughter on phone  Disposition: Status is: Inpatient Remains inpatient appropriate because: Severity of illness  Planned Discharge Destination: Home  Time spent: 50 minutes  This record has been created using Conservation officer, historic buildings. Errors have been sought and corrected,but may not always be located. Such creation errors do not reflect on the standard of care.   Author: Arnetha Courser, MD 01/30/2023 1:35 PM  For on call review [email or web address].

## 2023-01-30 NOTE — Hospital Course (Addendum)
Taken from H&P.  [name] is a 77 y.o. 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  It is associated with generalized weakness.  Patient continues to have poor appetite and poor oral intake.  She has chronic diarrhea that is no worse than her baseline.   ED course and data review: Vitals within normal limits.  Labs notable for creatinine of 3.08 up from baseline of 1.17 with bicarb of 20 and potassium 3.3.  Sodium 131.  Hemoglobin 11.2 which is improved from baseline EKG. showing NSR at 96 with nonspecific ST-T wave changes CT abdomen and pelvis consistent with chronic colitis. Patient hydrated with a 1 L LR bolus.  5/15: Vital stable.  Labs with improvement of creatinine to 2.03, hypokalemia with potassium of 3.2.  Checking TSH to rule out other options for excessive weight loss. Per GI note recent EGD and biopsies of polypectomy sites were benign. Will get benefit from CT chest abdomen and pelvis with contrast once renal function improved. Going for repeat EGD and colonoscopy tomorrow.  Also started on Remeron-does need to be titrated by PCP.  TSH was low so hyperthyroidism can be the cause-checking thyroid panel.  5/16: Vital stable, renal functions continue to improve with creatinine at 1.58 today.  Persistent hypokalemia with potassium of 3.1, magnesium normal.  Thyroid panel with low TSH but normal total T4 and free thyroxine.  T3 uptake ratio within normal range of 33%.  Thyroid antibodies negative.  EGD and colonoscopy got postponed for tomorrow due to incomplete prep  5/17: Remained hemodynamically stable.  Had her EGD and colonoscopy today which  were normal and GI signed off.  Patient with some improvement in appetite and tolerating diet without any nausea and vomiting.  She can use Zofran as needed for nausea and Imodium as needed for diarrhea.  She need to follow-up with her gastroenterologist closely for further recommendations.  Patient need to have a repeat TSH checked in 3 to 4 weeks by primary care provider and they can refer to see endocrinologist if appropriate and needed.  Primary care provider can titrate the dose of Remeron.  Patient will continue the rest of her home medications except the spironolactone as mentioned above and follow-up with her providers closely for further recommendations.

## 2023-01-30 NOTE — Telephone Encounter (Signed)
I reviewed things.  I think reasonable to do colonoscopy especially with CT scan showing some inflammation, I do not think she has cancer however.  She had a relatively recent EGD.  If she is admitted to the hospital for these issues I would recommend following the consultants recommendations.

## 2023-01-30 NOTE — Progress Notes (Signed)
Called and Notified Dr. Jodelle Gross that patient is having a hard time tolerating bowel prep.

## 2023-01-30 NOTE — Assessment & Plan Note (Signed)
Estimated body mass index is 23.69 kg/m as calculated from the following:   Height as of this encounter: 5\' 1"  (1.549 m).   Weight as of this encounter: 56.9 kg.   -Dietitian consult

## 2023-01-31 ENCOUNTER — Telehealth: Payer: Self-pay | Admitting: Family Medicine

## 2023-01-31 DIAGNOSIS — E440 Moderate protein-calorie malnutrition: Secondary | ICD-10-CM | POA: Insufficient documentation

## 2023-01-31 DIAGNOSIS — R634 Abnormal weight loss: Secondary | ICD-10-CM | POA: Diagnosis not present

## 2023-01-31 LAB — BASIC METABOLIC PANEL
Anion gap: 8 (ref 5–15)
BUN: 22 mg/dL (ref 8–23)
CO2: 18 mmol/L — ABNORMAL LOW (ref 22–32)
Calcium: 8.5 mg/dL — ABNORMAL LOW (ref 8.9–10.3)
Chloride: 112 mmol/L — ABNORMAL HIGH (ref 98–111)
Creatinine, Ser: 1.58 mg/dL — ABNORMAL HIGH (ref 0.44–1.00)
GFR, Estimated: 34 mL/min — ABNORMAL LOW (ref >60)
Glucose, Bld: 93 mg/dL (ref 70–99)
Potassium: 3.1 mmol/L — ABNORMAL LOW (ref 3.5–5.1)
Sodium: 138 mmol/L (ref 135–145)

## 2023-01-31 LAB — THYROID PANEL WITH TSH
Free Thyroxine Index: 2.2 (ref 1.2–4.9)
T3 Uptake Ratio: 33 % (ref 24–39)
T4, Total: 6.8 ug/dL (ref 4.5–12.0)
TSH: 0.157 u[IU]/mL — ABNORMAL LOW (ref 0.450–4.500)

## 2023-01-31 LAB — THYROID STIMULATING IMMUNOGLOBULIN: Thyroid Stimulating Immunoglob: <0.1 IU/L (ref 0.00–0.55)

## 2023-01-31 LAB — THYROID PEROXIDASE ANTIBODY: Thyroperoxidase Ab SerPl-aCnc: 13 IU/mL (ref 0–34)

## 2023-01-31 MED ORDER — POLYETHYLENE GLYCOL 3350 17 GM/SCOOP PO POWD
1.0000 | Freq: Once | ORAL | Status: AC
  Administered 2023-01-31: 255 g via ORAL
  Filled 2023-01-31: qty 255

## 2023-01-31 MED ORDER — ENSURE ENLIVE PO LIQD: 237.0000 mL | Freq: Three times a day (TID) | ORAL | Status: DC

## 2023-01-31 MED ORDER — ADULT MULTIVITAMIN W/MINERALS CH: 1.0000 | ORAL_TABLET | Freq: Every day | ORAL | Status: DC

## 2023-01-31 MED ORDER — POTASSIUM CHLORIDE CRYS ER 20 MEQ PO TBCR
40.0000 meq | EXTENDED_RELEASE_TABLET | Freq: Once | ORAL | Status: AC
  Administered 2023-01-31: 40 meq via ORAL
  Filled 2023-01-31: qty 2

## 2023-01-31 NOTE — Progress Notes (Signed)
Patient has completed 3/5 of bowel prep. Patient states she is unable to tolerate any more intact. Patient was encouraged to finish the bowel prep if able to. Patient understands need to complete bowel prep, otherwise colon may not be clear of stool and colonoscopy might be inconclusive.

## 2023-01-31 NOTE — Care Management Important Message (Signed)
Important Message  Patient Details  Name: [name] MRN: [id number] Date of Birth: [birth year]   Medicare Important Message Given:  Yes     Johnell Comings 01/31/2023, 12:59 PM

## 2023-01-31 NOTE — Progress Notes (Signed)
Progress Note   Patient: [name]:[id number] DOB: June 21, 1946 DOA: 01/28/2023     2 DOS: the patient was seen and examined on 01/31/2023   Brief hospital course: Taken from H&P.  PLACIDA LUSCH is a 77 y.o. 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  It is associated with generalized weakness.  Patient continues to have poor appetite and poor oral intake.  She has chronic diarrhea that is no worse than her baseline.   ED course and data review: Vitals within normal limits.  Labs notable for creatinine of 3.08 up from baseline of 1.17 with bicarb of 20 and potassium 3.3.  Sodium 131.  Hemoglobin 11.2 which is improved from baseline EKG. showing NSR at 96 with nonspecific ST-T wave changes CT abdomen and pelvis consistent with chronic colitis. Patient hydrated with a 1 L LR bolus.  5/15: Vital stable.  Labs with improvement of creatinine to 2.03, hypokalemia with potassium of 3.2.  Checking TSH to rule out other options for excessive weight loss. Per GI note recent EGD and biopsies of polypectomy sites were benign. Will get benefit from CT chest abdomen and pelvis with contrast once renal function improved. Going for repeat EGD and colonoscopy tomorrow.  Also started on Remeron-does need to be titrated by PCP.  TSH was low so hyperthyroidism can be the cause-checking thyroid panel.  5/16: Vital stable, renal functions continue to improve with creatinine at 1.58 today.  Persistent hypokalemia with potassium of 3.1, magnesium normal.  Thyroid panel with low TSH but normal total T4 and free thyroxine.  T3 uptake ratio within normal range of 33%.  Thyroid  antibodies negative.  EGD and colonoscopy got postponed for tomorrow due to incomplete prep    Assessment and Plan: * Acute renal failure superimposed on stage 3a chronic kidney disease (HCC) Creatinine above 3, baseline 1.17, improving with IV fluid, currently at 2.03 -Check renal ultrasound -Hydrate and monitor  Inadequate oral intake Unintentional weight loss, acute on chronic Protein calorie malnutrition, unspecified IV hydration Nutritionist consult GI to repeat EGD and colonoscopy tomorrow  Loss of weight Patient has unintentional weight loss.  Workup so far is negative.  Recent EGD at prior polypectomy site was negative. GI was consulted and patient will be going a repeat EGD and colonoscopy tomorrow. CT abdomen and pelvis without contrast was without any significant abnormality, did shows chronic colitis.  TSH low Will get benefit from CT chest, abdomen and pelvis with contrast once renal function are improved to rule out any underlying lesion -Started her on Remeron-does need to be titrated by PCP -Thyroid panel with low TSH only, T4 and T3 was normal.  Thyroid antibodies negative.  Protein calorie malnutrition (HCC) Estimated body mass index is 23.69 kg/m as calculated from the following:   Height as of this encounter: 5\' 1"  (1.549 m).   Weight as of this encounter: 56.9 kg.   -Dietitian consult  Collagenous colitis Chronic and stable Continue loperamide  Hypokalemia Potassium at 3.2 today.  Normal magnesium and phosphorus -Replete potassium and monitor  Essential hypertension Continue metoprolol.  Will hold spironolactone  History of gastric polyp History of chronic gastritis IV Protonix GI consult  Thrombocytosis Appears chronic  Unable to accept blood transfusions as patient is Jehovah's Witness Jehovah's Witness  Normocytic anemia Appears stable, slight decrease in hemoglobin most likely dilutional effect. -Monitor  hemoglobin  Hyponatremia Likely secondary to dehydration, resolved with IV fluid Continue to monitor   Subjective: Patient was seen and examined today.  No new concern.  No nausea vomiting today  Physical Exam: Vitals:   01/30/23 1541 01/30/23 2100 01/31/23 0740 01/31/23 1510  BP: (!) 100/53 110/72 105/60 (!) 126/54  Pulse: 91 (!) 104 89 87  Resp: 18 15 16 16   Temp: 98.6 F (37 C) 98.5 F (36.9 C) 98.8 F (37.1 C) 99.1 F (37.3 C)  TempSrc: Oral Oral Oral Oral  SpO2: 100% 100% 100% 100%  Weight:      Height:       General.  Frail elderly lady, in no acute distress. Pulmonary.  Lungs clear bilaterally, normal respiratory effort. CV.  Regular rate and rhythm, no JVD, rub or murmur. Abdomen.  Soft, nontender, nondistended, BS positive. CNS.  Alert and oriented .  No focal neurologic deficit. Extremities.  No edema, no cyanosis, pulses intact and symmetrical. Psychiatry.  Judgment and insight appears normal.   Data Reviewed: Prior data reviewed  Family Communication:   Disposition: Status is: Inpatient Remains inpatient appropriate because: Severity of illness  Planned Discharge Destination: Home  Time spent: 45 minutes  This record has been created using Conservation officer, historic buildings. Errors have been sought and corrected,but may not always be located. Such creation errors do not reflect on the standard of care.   Author: Arnetha Courser, MD 01/31/2023 3:18 PM  For on call review [email or web address].

## 2023-01-31 NOTE — Telephone Encounter (Signed)
Contacted [name] to schedule their annual wellness visit. Call back at later date: 02/14/2023  I spoke to patient and she's in the hospital.  Thank you,  [facility] Support [facility] Direct dial  [phone]

## 2023-01-31 NOTE — Progress Notes (Signed)
GI note    Didn't complete bowel prep overnight - not cleaned out will order miralax gatorade prep and plan procedure for tomorrow

## 2023-01-31 NOTE — Progress Notes (Signed)
Initial Nutrition Assessment  DOCUMENTATION CODES:   Non-severe (moderate) malnutrition in context of chronic illness  INTERVENTION:   Ensure Enlive po TID with diet advancement, each supplement provides 350 kcal and 20 grams of protein.  MVI po daily with diet advancement  Pt at high refeed risk; recommend monitor potassium, magnesium and phosphorus labs daily until stable  Check vitamins B1, B6, B12, folate, ferritin, D, A, E, zinc and copper   Daily weights   NUTRITION DIAGNOSIS:   Moderate Malnutrition related to chronic illness as evidenced by moderate fat depletion, moderate muscle depletion, 21 percent weight loss in 7 months.  GOAL:   Patient will meet greater than or equal to 90% of their needs  MONITOR:   PO intake, Supplement acceptance, Labs, Weight trends, I & O's  REASON FOR ASSESSMENT:   Malnutrition Screening Tool    ASSESSMENT:   77 y/o female with h/o CKD III, HTN, GERD, hiatal hernia, umbilical hernia s/p repair, mood disorder, anal fissure, gastric polyp, B12 deficiency, depression, eosinophilic enteritis/gastritis and chronic diarrhea with lymphocytic colitis who is admitted with weakness and weight loss.  Met with pt in room today. Pt reports poor appetite and oral intake for several months pta. Pt reports that she has to force herself to eat. Pt reports occasionally drinking chocolate Boost. Pt reports significant weight loss pta. Per chart, pt is down ~33lbs(21%) over the past 7 months; this is severe weight loss. Pt reports chronic diarrhea. Pt currently NPO pending EGD/colonoscopy. Pt is at high risk for developing severe malnutrition. RD discussed with pt the importance of adequate nutrition needed to preserve lean muscle. Pt is amendable to supplements with diet advancement. Pt initiated on remeron. RD will check vitamins labs to r/o deficiency as pt noted to have diarrhea and wt loss. Pt is at high refeed risk.   Medications reviewed and include:  lovenox, fe fum-vit c-b12,fa, remeron, protonix, Kcl  Labs reviewed: K 3.1(L), creat 1.58(H) P 3.0 wnl, Mg 2.0 wnl Hgb 8.4(L), Hct 24.5(L)  NUTRITION - FOCUSED PHYSICAL EXAM:  Flowsheet Row Most Recent Value  Orbital Region Mild depletion  Upper Arm Region Mild depletion  Thoracic and Lumbar Region Mild depletion  Buccal Region No depletion  Temple Region Mild depletion  Clavicle Bone Region Severe depletion  Clavicle and Acromion Bone Region Severe depletion  Scapular Bone Region Moderate depletion  Dorsal Hand Mild depletion  Patellar Region Moderate depletion  Anterior Thigh Region Mild depletion  Posterior Calf Region Mild depletion  Edema (RD Assessment) None  Hair Reviewed  Eyes Reviewed  Mouth Reviewed  Skin Reviewed  Nails Reviewed   Diet Order:   Diet Order             Diet NPO time specified  Diet effective midnight                  EDUCATION NEEDS:   Education needs have been addressed  Skin:  Skin Assessment: Reviewed RN Assessment  Last BM:  5/16  Height:   Ht Readings from Last 1 Encounters:  01/28/23 5\' 1"  (1.549 m)    Weight:   Wt Readings from Last 1 Encounters:  01/28/23 56.9 kg    Ideal Body Weight:  47.7 kg  BMI:  Body mass index is 23.69 kg/m.  Estimated Nutritional Needs:   Kcal:  1400-1600kcal/day  Protein:  70-80g/day  Fluid:  1.3-1.5L/day  Betsey Holiday MS, RD, LDN Please refer to [facility] for RD and/or RD on-call/weekend/after hours pager

## 2023-01-31 NOTE — Assessment & Plan Note (Signed)
Patient has unintentional weight loss.  Workup so far is negative.  Recent EGD at prior polypectomy site was negative. GI was consulted and patient will be going a repeat EGD and colonoscopy tomorrow. CT abdomen and pelvis without contrast was without any significant abnormality, did shows chronic colitis.  TSH low Will get benefit from CT chest, abdomen and pelvis with contrast once renal function are improved to rule out any underlying lesion -Started her on Remeron-does need to be titrated by PCP -Thyroid panel with low TSH only, T4 and T3 was normal.  Thyroid antibodies negative.

## 2023-01-31 NOTE — TOC CM/SW Note (Signed)
  Transition of Care (TOC) Screening Note   Patient Details  Name: [name] Date of Birth: 12-Feb-1946   Transition of Care [facility]) CM/SW Contact:    Margarito Liner, LCSW Phone Number: 01/31/2023, 8:12 AM    Transition of Care Department [facility]) has reviewed patient and no TOC needs have been identified at this time. We will continue to monitor patient advancement through interdisciplinary progression rounds. If new patient transition needs arise, please place a TOC consult.

## 2023-02-01 ENCOUNTER — Encounter
Admission: EM | Disposition: A | Discharge status: Home Health | Payer: Self-pay | Source: Home / Self Care | Attending: Internal Medicine

## 2023-02-01 ENCOUNTER — Encounter: Payer: Self-pay | Admitting: Internal Medicine

## 2023-02-01 ENCOUNTER — Inpatient Hospital Stay: Payer: Medicare Other | Admitting: Anesthesiology

## 2023-02-01 DIAGNOSIS — N1831 Chronic kidney disease, stage 3a: Secondary | ICD-10-CM | POA: Diagnosis not present

## 2023-02-01 DIAGNOSIS — R634 Abnormal weight loss: Secondary | ICD-10-CM | POA: Diagnosis not present

## 2023-02-01 DIAGNOSIS — N179 Acute kidney failure, unspecified: Secondary | ICD-10-CM | POA: Diagnosis not present

## 2023-02-01 DIAGNOSIS — R638 Other symptoms and signs concerning food and fluid intake: Secondary | ICD-10-CM | POA: Diagnosis not present

## 2023-02-01 HISTORY — PX: COLONOSCOPY WITH PROPOFOL: SHX5780

## 2023-02-01 HISTORY — PX: ESOPHAGOGASTRODUODENOSCOPY (EGD) WITH PROPOFOL: SHX5813

## 2023-02-01 LAB — BASIC METABOLIC PANEL
Anion gap: 9 (ref 5–15)
BUN: 17 mg/dL (ref 8–23)
CO2: 20 mmol/L — ABNORMAL LOW (ref 22–32)
Calcium: 9.4 mg/dL (ref 8.9–10.3)
Chloride: 115 mmol/L — ABNORMAL HIGH (ref 98–111)
Creatinine, Ser: 1.51 mg/dL — ABNORMAL HIGH (ref 0.44–1.00)
GFR, Estimated: 35 mL/min — ABNORMAL LOW (ref >60)
Glucose, Bld: 90 mg/dL (ref 70–99)
Potassium: 3.9 mmol/L (ref 3.5–5.1)
Sodium: 144 mmol/L (ref 135–145)

## 2023-02-01 LAB — PHOSPHORUS: Phosphorus: 2.6 mg/dL (ref 2.5–4.6)

## 2023-02-01 LAB — VITAMIN B12: Vitamin B-12: 1463 pg/mL — ABNORMAL HIGH (ref 180–914)

## 2023-02-01 LAB — VITAMIN D 25 HYDROXY (VIT D DEFICIENCY, FRACTURES): Vit D, 25-Hydroxy: 22.92 ng/mL — ABNORMAL LOW (ref 30–100)

## 2023-02-01 LAB — FOLATE: Folate: 16.5 ng/mL (ref >5.9)

## 2023-02-01 LAB — MAGNESIUM: Magnesium: 1.5 mg/dL — ABNORMAL LOW (ref 1.7–2.4)

## 2023-02-01 LAB — FERRITIN: Ferritin: 85 ng/mL (ref 11–307)

## 2023-02-01 SURGERY — ESOPHAGOGASTRODUODENOSCOPY (EGD) WITH PROPOFOL
Anesthesia: General

## 2023-02-01 MED ORDER — PHENYLEPHRINE 80 MCG/ML (10ML) SYRINGE FOR IV PUSH (FOR BLOOD PRESSURE SUPPORT)
PREFILLED_SYRINGE | INTRAVENOUS | Status: AC
  Filled 2023-02-01: qty 10

## 2023-02-01 MED ORDER — PROPOFOL 10 MG/ML IV BOLUS
INTRAVENOUS | Status: DC | PRN
  Administered 2023-02-01: 50 mg via INTRAVENOUS

## 2023-02-01 MED ORDER — MAGNESIUM SULFATE 2 GM/50ML IV SOLN
2.0000 g | Freq: Once | INTRAVENOUS | Status: AC
  Administered 2023-02-01: 2 g via INTRAVENOUS
  Filled 2023-02-01: qty 50

## 2023-02-01 MED ORDER — ENSURE ENLIVE PO LIQD: 237.0000 mL | Freq: Three times a day (TID) | ORAL | 12 refills | Status: AC

## 2023-02-01 MED ORDER — ADULT MULTIVITAMIN W/MINERALS CH: 1.0000 | ORAL_TABLET | Freq: Every day | ORAL | 1 refills | Status: AC

## 2023-02-01 MED ORDER — ESOMEPRAZOLE MAGNESIUM 20 MG PO CPDR: 20.0000 mg | DELAYED_RELEASE_CAPSULE | Freq: Every day | ORAL | 1 refills | Status: DC

## 2023-02-01 MED ORDER — MIRTAZAPINE 15 MG PO TBDP: 15.0000 mg | ORAL_TABLET | Freq: Every day | ORAL | 1 refills | Status: DC

## 2023-02-01 MED ORDER — FE FUM-VIT C-VIT B12-FA 460-60-0.01-1 MG PO CAPS: 1.0000 | ORAL_CAPSULE | Freq: Every day | ORAL | 0 refills | Status: DC

## 2023-02-01 MED ORDER — LIDOCAINE HCL (CARDIAC) PF 100 MG/5ML IV SOSY
PREFILLED_SYRINGE | INTRAVENOUS | Status: DC | PRN
  Administered 2023-02-01: 40 mg via INTRAVENOUS

## 2023-02-01 MED ORDER — PROPOFOL 500 MG/50ML IV EMUL
INTRAVENOUS | Status: DC | PRN
  Administered 2023-02-01: 100 ug/kg/min via INTRAVENOUS

## 2023-02-01 MED ORDER — SODIUM CHLORIDE 0.9 % IV SOLN: INTRAVENOUS | Status: DC

## 2023-02-01 MED ORDER — PHENYLEPHRINE 80 MCG/ML (10ML) SYRINGE FOR IV PUSH (FOR BLOOD PRESSURE SUPPORT)
PREFILLED_SYRINGE | INTRAVENOUS | Status: DC | PRN
  Administered 2023-02-01: 160 ug via INTRAVENOUS
  Administered 2023-02-01: 80 ug via INTRAVENOUS

## 2023-02-01 NOTE — Op Note (Signed)
[facility] Gastroenterology Patient Name: [name] Procedure Date: 02/01/2023 11:13 AM MRN: [id number] Account #: [id number] Date of Birth: 1946-01-18 Admit Type: Outpatient Age: 77 Room: [facility] ENDO ROOM 4 Gender: Female Note Status: Finalized Instrument Name: Upper Endoscope [id number] Procedure:             Upper GI endoscopy Indications:           Weight loss Providers:             [name] MD, MD Referring MD:          No Local Md, MD (Referring MD) Medicines:             Monitored Anesthesia Care Complications:         No immediate complications. Procedure:             Pre-Anesthesia Assessment:                        - Prior to the procedure, a History and Physical was                         performed, and patient medications, allergies and                         sensitivities were reviewed. The patient's tolerance                         of previous anesthesia was reviewed.                        - The risks and benefits of the procedure and the                         sedation options and risks were discussed with the                         patient. All questions were answered and informed                         consent was obtained.                        - ASA Grade Assessment: II - A patient with mild                         systemic disease.                        After obtaining informed consent, the endoscope was                         passed under direct vision. Throughout the procedure,                         the patient's blood pressure, pulse, and oxygen                         saturations were monitored continuously. The Endoscope                         was  introduced through the mouth, and advanced to the                         third part of duodenum. The upper GI endoscopy was                         accomplished with ease. The patient tolerated the                         procedure well. Findings:      The esophagus was normal.       The stomach was normal.      The examined duodenum was normal. Impression:            - Normal esophagus.                        - Normal stomach.                        - Normal examined duodenum.                        - No specimens collected. Recommendation:        - Perform a colonoscopy today. Procedure Code(s):     --- Professional ---                        [phone], Esophagogastroduodenoscopy, flexible,                         transoral; diagnostic, including collection of                         specimen(s) by brushing or washing, when performed                         (separate procedure) Diagnosis Code(s):     --- Professional ---                        R63.4, Abnormal weight loss CPT copyright 2022 American Medical Association. All rights reserved. The codes documented in this report are preliminary and upon coder review may  be revised to meet current compliance requirements. [name], MD [name] MD, MD 02/01/2023 11:25:33 AM This report has been signed electronically. Number of Addenda: 0 Note Initiated On: 02/01/2023 11:13 AM Estimated Blood Loss:  Estimated blood loss: none.      [facility]

## 2023-02-01 NOTE — Progress Notes (Signed)
Discharge instructions reviewed with the patient. IV's removed . Patient being sent out via wheelchair with belongings

## 2023-02-01 NOTE — Progress Notes (Signed)
Patient consumed about 2/3 of bowel prep w/ gatorade. Had bowel output this morning, out was a cloudy yellow liquid. No solid output noted.
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
## 2023-02-01 NOTE — Anesthesia Procedure Notes (Signed)
Procedure Name: MAC Date/Time: 02/01/2023 11:09 AM  Performed by: Hezzie Bump, CRNAPre-anesthesia Checklist: Patient identified, Emergency Drugs available, Suction available and Patient being monitored Patient Re-evaluated:Patient Re-evaluated prior to induction Oxygen Delivery Method: Nasal cannula Induction Type: IV induction Placement Confirmation: positive ETCO2

## 2023-02-01 NOTE — Discharge Summary (Signed)
Physician Discharge Summary   Patient: [name] MRN: [id number] DOB: 06/26/1946  Admit date:     01/28/2023  Discharge date: 02/01/23  Discharge Physician: Arnetha Courser   PCP: McDiarmid, Leighton Roach, MD   Recommendations at discharge:  Please obtain CBC and BMP in 1 week Please repeat TSH in 3 to 4 weeks-patient was having low TSH with normal thyroid profile and antibodies. Patient need titration of Remeron Follow-up with gastroenterology Follow-up with primary care provider Please follow-up the results for some nutritional deficiencies as they were pending at the time of discharge.  Discharge Diagnoses: Principal Problem:   Acute renal failure superimposed on stage 3a chronic kidney disease (HCC) Active Problems:   Loss of weight   Inadequate oral intake   Collagenous colitis   Protein calorie malnutrition (HCC)   Hypokalemia   Essential hypertension   History of gastric polyp   Thrombocytosis   Normocytic anemia   Unable to accept blood transfusions as patient is Jehovah's Witness   Hyponatremia   AKI (acute kidney injury) (HCC)   Malnutrition of moderate degree   Hospital Course: Taken from H&P.  [name] is a 77 y.o. 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  It is associated with generalized weakness.  Patient continues to have poor appetite and poor oral intake.  She has chronic diarrhea that is no worse than her baseline.   ED course and data review: Vitals within normal limits.  Labs notable for creatinine of 3.08 up from baseline of 1.17 with bicarb of 20 and potassium 3.3.  Sodium 131.  Hemoglobin 11.2 which is improved  from baseline EKG. showing NSR at 96 with nonspecific ST-T wave changes CT abdomen and pelvis consistent with chronic colitis. Patient hydrated with a 1 L LR bolus.  5/15: Vital stable.  Labs with improvement of creatinine to 2.03, hypokalemia with potassium of 3.2.  Checking TSH to rule out other options for excessive weight loss. Per GI note recent EGD and biopsies of polypectomy sites were benign. Will get benefit from CT chest abdomen and pelvis with contrast once renal function improved. Going for repeat EGD and colonoscopy tomorrow.  Also started on Remeron-does need to be titrated by PCP.  TSH was low so hyperthyroidism can be the cause-checking thyroid panel.  5/16: Vital stable, renal functions continue to improve with creatinine at 1.58 today.  Persistent hypokalemia with potassium of 3.1, magnesium normal.  Thyroid panel with low TSH but normal total T4 and free thyroxine.  T3 uptake ratio within normal range of 33%.  Thyroid antibodies negative.  EGD and colonoscopy got postponed for tomorrow due to incomplete prep  5/17: Remained hemodynamically stable.  Had her EGD and colonoscopy today which were normal and GI signed off.  Patient with some improvement in appetite and tolerating diet without any nausea and vomiting.  She can use Zofran as needed for nausea and Imodium as needed for diarrhea.  She need to follow-up with her gastroenterologist closely for further recommendations.  Patient need to have a repeat TSH checked in 3 to 4 weeks by primary care provider and they can refer to see endocrinologist if appropriate and needed.  Primary care provider can titrate the dose of Remeron.  Patient will continue the rest of her home medications except the spironolactone as mentioned above and follow-up with her providers closely for further recommendations.  Assessment and Plan: * Acute renal failure superimposed on stage 3a chronic kidney disease (HCC) Creatinine above 3, baseline 1.17,  improving with IV fluid, currently at 2.03 -Check renal ultrasound -Hydrate and monitor  Inadequate oral intake Unintentional weight loss, acute on chronic Protein calorie malnutrition, unspecified IV hydration Nutritionist consult GI to repeat EGD and colonoscopy tomorrow  Loss of weight Patient has unintentional weight loss.  Workup so far is negative.  Recent EGD at prior polypectomy site was negative. GI was consulted and patient will be going a repeat EGD and colonoscopy tomorrow. CT abdomen and pelvis without contrast was without any significant abnormality, did shows chronic colitis.  TSH low Will get benefit from CT chest, abdomen and pelvis with contrast once renal function are improved to rule out any underlying lesion -Started her on Remeron-does need to be titrated by PCP -Thyroid panel with low TSH only, T4 and T3 was normal.  Thyroid antibodies negative.  Protein calorie malnutrition (HCC) Estimated body mass index is 23.69 kg/m as calculated from the following:   Height as of this encounter: 5\' 1"  (1.549 m).   Weight as of this encounter: 56.9 kg.   -Dietitian consult  Collagenous colitis Chronic and stable Continue loperamide  Hypokalemia Potassium at 3.2 today.  Normal magnesium and phosphorus -Replete potassium and monitor  Essential hypertension Continue metoprolol.  Will hold spironolactone  History of gastric polyp History of chronic gastritis IV Protonix GI consult  Thrombocytosis Appears chronic  Unable to accept blood transfusions as patient is Jehovah's Witness Jehovah's Witness  Normocytic anemia Appears stable, slight decrease in hemoglobin most likely dilutional effect. -Monitor hemoglobin  Hyponatremia Likely secondary to dehydration, resolved with IV fluid Continue to monitor         Consultants: Gastroenterology Procedures performed: EGD and colonoscopy Disposition: Home health Diet recommendation:  Discharge Diet  Orders (From admission, onward)     Start     Ordered   02/01/23 0000  Diet - low sodium heart healthy        02/01/23 1320           Regular diet DISCHARGE MEDICATION: Allergies as of 02/01/2023       Reactions   Cefuroxime Rash   Has patient had a PCN reaction causing immediate rash, facial/tongue/throat swelling, SOB or lightheadedness with hypotension: Yes Has patient had a PCN reaction causing severe rash involving mucus membranes or skin necrosis: No Has patient had a PCN reaction that required hospitalization Yes Has patient had a PCN reaction occurring within the last 10 years: Yes If all of the above answers are "NO", then may proceed with Cephalosporin use.   Nexlizet [bempedoic Acid-ezetimibe] Anaphylaxis   Throat swelling   Azithromycin Itching   Contrast Media [iodinated Contrast Media] Itching   Oxycodone Itching   Peanut (diagnostic)    No evidence of peanut allergy on skin testing, allergy panel nor with allergist office oral challenge 10/23   Phenergan [promethazine Hcl] Nausea And Vomiting   Could retry if other antiemetics fail   Repatha [evolocumab] Itching   Shellfish Allergy Swelling    skin testing is slightly positive to scallops.  negative to peanut, shrimp, lobster, crab, oyster. Allergist recommends avoidance of these foods   Statins Other (See Comments)   Myalgias on pravastatin, atorvastatin, and  rosuvastatin   Amoxicillin Rash   headache   Tramadol Rash, Hives        Medication List     STOP taking these medications    scopolamine 1 MG/3DAYS Commonly known as: TRANSDERM-SCOP   spironolactone 25 MG tablet Commonly known as: ALDACTONE       TAKE these medications    acetaminophen 500 MG tablet Commonly known as: TYLENOL Take 1 tablet (500 mg total) by mouth every 6 (six) hours as needed.   CoQ-10 50 MG Caps Take 1 capsule by mouth 2 (two) times daily. (with Grape Seed Complex)   EPINEPHrine 0.3 mg/0.3 mL Soaj  injection Commonly known as: EPI-PEN Inject 0.3 mg into the muscle as needed for anaphylaxis.   esomeprazole 20 MG capsule Commonly known as: NexIUM Take 1 capsule (20 mg total) by mouth daily.   Fe Fum-Vit C-Vit B12-FA Caps capsule Commonly known as: TRIGELS-F FORTE Take 1 capsule by mouth daily after breakfast. Start taking on: Feb 02, 2023   feeding supplement Liqd Take 237 mLs by mouth 3 (three) times daily between meals. Start taking on: Feb 02, 2023   Fish Oil 1000 MG Caps Take 1 capsule by mouth 2 (two) times daily.   loperamide 2 MG capsule Commonly known as: Imodium A-D Take 1-2 capsules (2-4 mg total) by mouth daily as needed for diarrhea or loose stools.   metoprolol succinate 50 MG 24 hr tablet Commonly known as: TOPROL-XL Take 1 tablet (50 mg total) by mouth in the morning AND 0.5 tablets (25 mg total) every evening. Take with or immediately following a meal..   mirtazapine 15 MG disintegrating tablet Commonly known as: REMERON SOL-TAB Take 1 tablet (15 mg total) by mouth at bedtime.   multivitamin with minerals Tabs tablet Take 1 tablet by mouth daily. Start taking on: Feb 02, 2023   ondansetron 4 MG disintegrating tablet Commonly known as: ZOFRAN-ODT Take 1 tablet (4 mg total) by mouth every 8 (eight) hours as needed for nausea or vomiting.   Pepto-Bismol 262 MG/15ML suspension Generic drug: bismuth subsalicylate Take 30 mLs by mouth in the morning, at noon, and at bedtime. What changed:  when to take this reasons to take this   potassium chloride SA 20 MEQ tablet Commonly known as: Klor-Con M20 TAKE TWO (2) TABLETS EACH MORNING AND 1 TABLET EACH EVENING.   Repatha Pushtronex System 420 MG/3.5ML Soct Generic drug: Evolocumab with Infusor Inject 3.5 mLs into the skin every 30 (thirty) days.        Discharge Exam: Filed Weights   01/28/23 1518 02/01/23 0450  Weight: 56.9 kg 60 kg   General.  Frail elderly lady, in no acute  distress. Pulmonary.  Lungs clear bilaterally, normal respiratory effort. CV.  Regular rate and rhythm, no JVD, rub or murmur. Abdomen.  Soft, nontender, nondistended, BS positive. CNS.  Alert and oriented .  No focal neurologic deficit. Extremities.  No edema, no cyanosis, pulses intact and symmetrical. Psychiatry.  Judgment and insight appears normal.   Condition at discharge: stable  The results of significant diagnostics from this hospitalization (including imaging, microbiology, ancillary and laboratory) are listed below for reference.   Imaging Studies: US RENAL  Result Date: 01/30/2023 CLINICAL DATA:  [id number] AKI (acute kidney injury) (HCC) [id number] EXAM: RENAL / URINARY TRACT ULTRASOUND COMPLETE COMPARISON:  None Available. FINDINGS: Right Kidney: Renal measurements: 10.3 x 3.3 x 7.2 cm = volume: 74.9 mL. No hydronephrosis. Mildly increased renal cortical echogenicity. There is a small cystic  lesion measuring 2.1 cm in the mid kidney which appear simple and requires no follow-up imaging. Left Kidney: Renal measurements: 10.3 x 5.6 x 4.8 cm = volume: 145.2 mL. No hydronephrosis. Mildly increased renal cortical echogenicity. Small simple cyst measuring 1.7 cm. This requires no follow-up imaging. Bladder: Appears normal for degree of bladder distention. Other: None. IMPRESSION: No hydronephrosis. Mildly increased renal cortical echogenicity bilaterally, as can be seen in medical renal disease. Electronically Signed   By: Caprice Renshaw M.D.   On: 01/30/2023 15:41   CT ABDOMEN PELVIS WO CONTRAST  Result Date: 01/28/2023 CLINICAL DATA:  Unintentional weight loss. Anorexia. Collagenous colitis. EXAM: CT ABDOMEN AND PELVIS WITHOUT CONTRAST TECHNIQUE: Multidetector CT imaging of the abdomen and pelvis was performed following the standard protocol without IV contrast. RADIATION DOSE REDUCTION: This exam was performed according to the departmental dose-optimization program which includes automated  exposure control, adjustment of the mA and/or kV according to patient size and/or use of iterative reconstruction technique. COMPARISON:  05/17/2013 FINDINGS: Lower chest: No acute findings. Hepatobiliary: No mass visualized on this unenhanced exam. Gallbladder is unremarkable. No evidence of biliary ductal dilatation. Pancreas: No mass or inflammatory process visualized on this unenhanced exam. Spleen:  Within normal limits in size. Adrenals/Urinary tract: No evidence of urolithiasis or hydronephrosis. Urinary bladder is nearly empty. Pelvic floor laxity with cystocele noted. Stomach/Bowel: Mild diffuse colonic wall thickening and loss of haustral fold pattern is seen. Fatty halo sign is also seen involving the right colon. There is no evidence of small bowel involvement. No evidence of bowel obstruction abnormal fluid collections. Vascular/Lymphatic: No pathologically enlarged lymph nodes identified. No evidence of abdominal aortic aneurysm. Aortic atherosclerotic calcification incidentally noted. Reproductive: Prior hysterectomy noted. Adnexal regions are unremarkable in appearance. Other:  None. Musculoskeletal:  No suspicious bone lesions identified. IMPRESSION: Mild diffuse colonic wall thickening with loss of haustral fold pattern and fatty halo sign in the right colon. These findings are consistent with chronic colitis. Prior hysterectomy, with pelvic floor laxity and cystocele. Electronically Signed   By: Danae Orleans M.D.   On: 01/28/2023 08:42    Microbiology: Results for orders placed or performed during the hospital encounter of 04/14/20  SARS CORONAVIRUS 2 (TAT 6-24 HRS) Nasopharyngeal Nasopharyngeal Swab     Status: None   Collection Time: 04/14/20 10:13 AM   Specimen: Nasopharyngeal Swab  Result Value Ref Range Status   SARS Coronavirus 2 NEGATIVE NEGATIVE Final    Comment: (NOTE) SARS-CoV-2 target nucleic acids are NOT DETECTED.  The SARS-CoV-2 RNA is generally detectable in upper and  lower respiratory specimens during the acute phase of infection. Negative results do not preclude SARS-CoV-2 infection, do not rule out co-infections with other pathogens, and should not be used as the sole basis for treatment or other patient management decisions. Negative results must be combined with clinical observations, patient history, and epidemiological information. The expected result is Negative.  Fact Sheet for Patients: HairSlick.no  Fact Sheet for Healthcare Providers: quierodirigir.com  This test is not yet approved or cleared by the [place] FDA and  has been authorized for detection and/or diagnosis of SARS-CoV-2 by FDA under an Emergency Use Authorization (EUA). This EUA will remain  in effect (meaning this test can be used) for the duration of the COVID-19 declaration under Se ction 564(b)(1) of the Act, 21 U.S.C. section 360bbb-3(b)(1), unless the authorization is terminated or revoked sooner.  Performed at [facility], [address] [place], [place] [address]  Labs: CBC: Recent Labs  Lab 01/28/23 1524 01/29/23 1640 01/30/23 0414  WBC 7.8 5.7 5.9  HGB 11.2* 9.0* 8.4*  HCT 33.1* 25.9* 24.5*  MCV 89.9 89.3 89.1  PLT 605* 406* 404*   Basic Metabolic Panel: Recent Labs  Lab 01/28/23 1524 01/29/23 1640 01/30/23 0414 01/31/23 0341 02/01/23 0429  NA 131* 134* 138 138 144  K 3.3* 3.7 3.2* 3.1* 3.9  CL 99 106 114* 112* 115*  CO2 20* 17* 18* 18* 20*  GLUCOSE 115* 115* 103* 93 90  BUN 50* 39* 33* 22 17  CREATININE 3.08* 2.44* 2.03* 1.58* 1.51*  CALCIUM 9.8 8.7* 8.7* 8.5* 9.4  MG  --   --  2.0  --  1.5*  PHOS  --   --  3.0  --  2.6   Liver Function Tests: No results for input(s): "AST", "ALT", "ALKPHOS", "BILITOT", "PROT", "ALBUMIN" in the last 168 hours. CBG: No results for input(s): "GLUCAP" in the last 168 hours.  Discharge time spent: greater than 30 minutes.  This  record has been created using Conservation officer, historic buildings. Errors have been sought and corrected,but may not always be located. Such creation errors do not reflect on the standard of care.   Signed: Arnetha Courser, MD [facility] 02/01/2023

## 2023-02-01 NOTE — Anesthesia Postprocedure Evaluation (Signed)
Anesthesia Post Note  Patient: [name]  Procedure(s) Performed: ESOPHAGOGASTRODUODENOSCOPY (EGD) WITH PROPOFOL COLONOSCOPY WITH PROPOFOL  Patient location during evaluation: Endoscopy Anesthesia Type: General Level of consciousness: awake and alert Pain management: pain level controlled Vital Signs Assessment: post-procedure vital signs reviewed and stable Respiratory status: spontaneous breathing, nonlabored ventilation, respiratory function stable and patient connected to nasal cannula oxygen Cardiovascular status: blood pressure returned to baseline and stable Postop Assessment: no apparent nausea or vomiting Anesthetic complications: no   No notable events documented.   Last Vitals:  Vitals:   02/01/23 1210 02/01/23 1244  BP: 122/73 120/64  Pulse: 95 96  Resp: 19 16  Temp:  36.4 C  SpO2: 100% 100%    Last Pain:  Vitals:   02/01/23 1244  TempSrc: Oral  PainSc:                  [name]

## 2023-02-01 NOTE — Transfer of Care (Signed)
Immediate Anesthesia Transfer of Care Note  Patient: [name]  Procedure(s) Performed: ESOPHAGOGASTRODUODENOSCOPY (EGD) WITH PROPOFOL COLONOSCOPY WITH PROPOFOL  Patient Location: Endoscopy Unit  Anesthesia Type:General  Level of Consciousness: drowsy  Airway & Oxygen Therapy: Patient Spontanous Breathing  Post-op Assessment: Report given to RN and Post -op Vital signs reviewed and stable  Post vital signs: Reviewed and stable  Last Vitals:  Vitals Value Taken Time  BP    Temp    Pulse 95 02/01/23 1144  Resp 21 02/01/23 1144  SpO2 100 % 02/01/23 1144  Vitals shown include unvalidated device data.  Last Pain:  Vitals:   02/01/23 1056  TempSrc: Temporal  PainSc: 0-No pain      Patients Stated Pain Goal: 0 (01/31/23 0849)  Complications: No notable events documented.

## 2023-02-01 NOTE — Op Note (Signed)
[facility] Gastroenterology Patient Name: [name] Procedure Date: 02/01/2023 11:13 AM MRN: [id number] Account #: [id number] Date of Birth: 01/04/1946 Admit Type: Outpatient Age: 77 Room: [facility] ENDO ROOM 4 Gender: Female Note Status: Finalized Instrument Name: Prentice Docker [id number] Procedure:             Colonoscopy Indications:           Weight loss Providers:             [name] MD, MD Referring MD:          No Local Md, MD (Referring MD) Medicines:             Monitored Anesthesia Care Complications:         No immediate complications. Procedure:             Pre-Anesthesia Assessment:                        - Prior to the procedure, a History and Physical was                         performed, and patient medications, allergies and                         sensitivities were reviewed. The patient's tolerance                         of previous anesthesia was reviewed.                        - The risks and benefits of the procedure and the                         sedation options and risks were discussed with the                         patient. All questions were answered and informed                         consent was obtained.                        - ASA Grade Assessment: II - A patient with mild                         systemic disease.                        After obtaining informed consent, the colonoscope was                         passed under direct vision. Throughout the procedure,                         the patient's blood pressure, pulse, and oxygen                         saturations were monitored continuously. The                         Colonoscope was introduced through the  anus and                         advanced to the the cecum, identified by the                         appendiceal orifice. The colonoscopy was performed                         with ease. The patient tolerated the procedure well.                         The quality  of the bowel preparation was excellent.                         Anatomical landmarks were photographed. Findings:      The perianal and digital rectal examinations were normal.      The entire examined colon appeared normal on direct and retroflexion       views. Impression:            - The entire examined colon is normal on direct and                         retroflexion views.                        - No specimens collected. Recommendation:        - Resume previous diet.                        - Continue present medications. Procedure Code(s):     --- Professional ---                        [phone], Colonoscopy, flexible; diagnostic, including                         collection of specimen(s) by brushing or washing, when                         performed (separate procedure) Diagnosis Code(s):     --- Professional ---                        R63.4, Abnormal weight loss CPT copyright 2022 American Medical Association. All rights reserved. The codes documented in this report are preliminary and upon coder review may  be revised to meet current compliance requirements. [name], MD [name] MD, MD 02/01/2023 11:41:58 AM This report has been signed electronically. Number of Addenda: 0 Note Initiated On: 02/01/2023 11:13 AM Scope Withdrawal Time: 0 hours 8 minutes 37 seconds  Total Procedure Duration: 0 hours 13 minutes 35 seconds  Estimated Blood Loss:  Estimated blood loss: none.      [facility]

## 2023-02-01 NOTE — Consult Note (Signed)
[facility] Customer service manager [facility]) Accountable Care Organization (ACO) [facility] Liaison Note  02/01/2023  [name] Dec 06, 1945 [id number]  Location: [facility] Liaison screened the patient remotely at [facility].  Insurance: [employer]   [name] is a 77 y.o. female who is a Primary Care Patient of McDiarmid, Leighton Roach, MD. The patient was screened for readmission hospitalization with noted medium risk score for unplanned readmission risk with 1 IP in 6 months.  The patient was assessed for potential [facility]) Care Management service needs for post hospital transition for care coordination. Review of patient's electronic medical record reveals patient was admitted with Acute Renal Failure. Attempted outreach call however pt with nurse review all discharge orders and paperwork inform by the granddaughter. Pt will be discharged with [facility] with no other needs noted in electronic medical record at this time.    Plan: [facility] Liaison will continue to follow progress and disposition to asess for post hospital community care coordination/management needs.  Referral request for community care coordination: anticipate [facility] Transitions of Care Team follow up.   [facility] Care Management/Population Health does not replace or interfere with any arrangements made by the Inpatient Transition of Care team.   For questions contact:   Madison Cousin, RN, BSN [facility] Liaison [place]   [facility]  Population Health Office Hours MTWF  8:00 am-6:00 pm Off on Thursday [phone] mobile [phone] [Office toll free line][facility] Office Hours are M-F 8:30 - 5 pm 24 hour nurse advise line [phone] Concierge  [name]

## 2023-02-01 NOTE — TOC Initial Note (Signed)
Transition of Care [facility]) - Initial/Assessment Note    Patient Details  Name: [name] MRN: [id number] Date of Birth: [birth year]  Transition of Care [facility]) CM/SW Contact:    Liliana Cline, LCSW Phone Number: 02/01/2023, 1:40 PM  Clinical Narrative:                 Patient to DC home today. MD has ordered HHPT/Aide. Spoke to patient. Patient is agreeable to HHPT, declines HH Aide. Asked MD to update orders. Confirmed PCP and address in chart. Referral accepted by [facility] with [facility].   Expected Discharge Plan: Home w Home Health Services Barriers to Discharge: Barriers Resolved   Patient Goals and CMS Choice Patient states their goals for this hospitalization and ongoing recovery are:: home with home health PT CMS Medicare.gov Compare Post Acute Care list provided to:: Patient Choice offered to / list presented to : Patient [place] ownership interest in [facility].provided to:: Patient    Expected Discharge Plan and Services       Living arrangements for the past 2 months: Single Family Home Expected Discharge Date: 02/01/23                         HH Arranged: PT HH Agency: [facility] Date [facility] Agency Contacted: 02/01/23   Representative spoke with at [facility] Agency: Kandee Keen  Prior Living Arrangements/Services Living arrangements for the past 2 months: Single Family Home   Patient language and need for interpreter reviewed:: Yes Do you feel safe going back to the place where you live?: Yes      Need for Family Participation in Patient Care: Yes (Comment) Care giver support system in place?: Yes (comment)   Criminal Activity/Legal Involvement Pertinent to Current Situation/Hospitalization: No - Comment as needed  Activities of Daily Living Home Assistive Devices/Equipment: None ADL Screening (condition at time of admission) Patient's cognitive ability adequate to safely complete daily activities?: Yes Is the patient deaf or have  difficulty hearing?: No Does the patient have difficulty seeing, even when wearing glasses/contacts?: Yes Does the patient have difficulty concentrating, remembering, or making decisions?: No Patient able to express need for assistance with ADLs?: No Does the patient have difficulty dressing or bathing?: No Independently performs ADLs?: Yes (appropriate for developmental age) Does the patient have difficulty walking or climbing stairs?: Yes Weakness of Legs: Both Weakness of Arms/Hands: Both  Permission Sought/Granted Permission sought to share information with : Oceanographer granted to share information with : Yes, Verbal Permission Granted     Permission granted to share info w AGENCY: HH        Emotional Assessment       Orientation: : Oriented to Self, Oriented to Situation, Oriented to Place, Oriented to  Time Alcohol / Substance Use: Not Applicable Psych Involvement: No (comment)  Admission diagnosis:  Chronic colitis [K52.9] Generalized weakness [R53.1] AKI (acute kidney injury) (HCC) [N17.9] Acute renal failure, unspecified acute renal failure type (HCC) [N17.9] Acute kidney injury superimposed on CKD (HCC) [N17.9, N18.9] Irritable bowel syndrome, unspecified type [K58.9] Patient Active Problem List   Diagnosis Date Noted   Malnutrition of moderate degree 01/31/2023   Acute renal failure superimposed on stage 3a chronic kidney disease (HCC) 01/29/2023   Inadequate oral intake 01/29/2023   Hyponatremia 01/29/2023   Protein calorie malnutrition (HCC) 01/29/2023   Unable to accept blood transfusions as patient is Jehovah's Witness 01/29/2023   AKI (acute kidney  injury) (HCC) 01/29/2023   Abnormal CT of the abdomen 01/24/2023   Chronic kidney disease, stage 3a (HCC) 01/04/2023   Adjustment disorder 10/25/2022   Thrombocytosis 10/25/2022   Normocytic anemia 10/25/2022   Dry skin dermatitis 06/08/2022   Asthma, intermittent 12/07/2021    Atherosclerosis of aorta (HCC) 09/02/2020   Episodic headache 01/06/2020   Dysthymia 09/03/2019   History of gastric polyp 06/30/2019   Pure hypercholesterolemia 10/03/2018   Stress incontinence 05/10/2017   Mild cognitive impairment, 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   At high risk for falls 04/04/2017   Sleep difficulties 04/04/2017   Essential hypertension 11/03/2015   Loss of weight 08/03/2015   Hypokalemia 08/03/2015   Degenerative arthritis of left knee 05/27/2015   Degenerative arthritis of right knee 05/27/2015   Osteopenia 12/14/2014   Collagenous colitis 01/16/2011    Class: Chronic   Premature ventricular contractions 11/17/2009   GASTROESOPHAGEAL REFLUX DISEASE 11/17/2009   SPONDYLOSIS, CERVICAL 11/27/2007   SPONDYLOSIS, LUMBAR 11/27/2007   PCP:  McDiarmid, Leighton Roach, MD Pharmacy:   [phone],  - [address] [place] [address] Phone: [phone] Fax: [phone]     Social Determinants of Health (SDOH) Social History: SDOH Screenings   Food Insecurity: No Food Insecurity (01/29/2023)  Housing: Low Risk  (01/29/2023)  Transportation Needs: No Transportation Needs (01/29/2023)  Utilities: Not At Risk (01/29/2023)  Depression (PHQ2-9): High Risk (01/03/2023)  Tobacco Use: Low Risk  (02/01/2023)   SDOH Interventions:     Readmission Risk Interventions     No data to display
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
## 2023-02-04 ENCOUNTER — Telehealth: Payer: Self-pay

## 2023-02-04 ENCOUNTER — Encounter: Payer: Self-pay | Admitting: Gastroenterology

## 2023-02-04 NOTE — Transitions of Care (Post Inpatient/ED Visit) (Signed)
02/04/2023  Name: [name] MRN: [id number] DOB: 1946-01-13  Today's TOC FU Call Status: Today's TOC FU Call Status:: Successful TOC FU Call Competed TOC FU Call Complete Date: 02/04/23  Transition Care Management Follow-up Telephone Call Date of Discharge: 02/01/23 Discharge Facility: [facility]) Type of Discharge: Inpatient Admission Primary Inpatient Discharge Diagnosis:: Acute Renal Failure How have you been since you were released from the hospital?: Better Any questions or concerns?: No  Items Reviewed: Did you receive and understand the discharge instructions provided?: Yes Medications obtained,verified, and reconciled?: Yes (Medications Reviewed) Any new allergies since your discharge?: No Dietary orders reviewed?: Yes Type of Diet Ordered:: Low Sodium Do you have support at home?: Yes People in Home: child(ren), adult Name of Support/Comfort Primary Source: [place]  Medications Reviewed Today: Medications Reviewed Today     Reviewed by Jodelle Gross, RN (Case Manager) on 02/04/23 at 1040  Med List Status: <None>   Medication Order Taking? Sig Documenting Provider Last Dose Status Informant  acetaminophen (TYLENOL) 500 MG tablet [id number] Yes Take 1 tablet (500 mg total) by mouth every 6 (six) hours as needed. Enid Derry, PA-C Taking Active Other  bismuth subsalicylate (PEPTO-BISMOL) 262 MG/15ML suspension [id number] No Take 30 mLs by mouth in the morning, at noon, and at bedtime.  Patient taking differently: Take 30 mLs by mouth as needed for indigestion.   Benancio Deeds, MD Unknown Active Other  Coenzyme Q10 (COQ-10) 50 MG CAPS [id number] No Take 1 capsule by mouth 2 (two) times daily. (with Grape Seed Complex) [provider] Unknown Active Self  EPINEPHrine 0.3 mg/0.3 mL IJ SOAJ injection [id number] No Inject 0.3 mg into the muscle as needed for anaphylaxis. Marcelyn Bruins, MD Unknown Active Other            Med Note Truman Hayward   Tue Jan 29, 2023 10:03 AM)    esomeprazole (NEXIUM) 20 MG capsule [id number] Yes Take 1 capsule (20 mg total) by mouth daily. Arnetha Courser, MD Taking Active   Evolocumab with Infusor [facility] PUSHTRONEX SYSTEM) 420 MG/3.5ML SOCT [id number] Yes Inject 3.5 mLs into the skin every 30 (thirty) days. Lyn Records, MD Taking Active Self, Pharmacy Records  Fe Fum-Vit C-Vit B12-FA (TRIGELS-F FORTE) CAPS capsule [id number] No Take 1 capsule by mouth daily after breakfast.  Patient not taking: Reported on 02/04/2023   Arnetha Courser, MD Unknown Active   feeding supplement (ENSURE ENLIVE / ENSURE PLUS) LIQD [id number] Yes Take 237 mLs by mouth 3 (three) times daily between meals. Arnetha Courser, MD Taking Active   loperamide (IMODIUM A-D) 2 MG capsule [id number] No Take 1-2 capsules (2-4 mg total) by mouth daily as needed for diarrhea or loose stools. Benancio Deeds, MD Unknown Active   metoprolol succinate (TOPROL-XL) 50 MG 24 hr tablet [id number] Yes Take 1 tablet (50 mg total) by mouth in the morning AND 0.5 tablets (25 mg total) every evening. Take with or immediately following a meal.. McDiarmid, Leighton Roach, MD Taking Active Self, Pharmacy Records  mirtazapine (REMERON SOL-TAB) 15 MG disintegrating tablet [id number] Yes Take 1 tablet (15 mg total) by mouth at bedtime. Arnetha Courser, MD Taking Active   Multiple Vitamin (MULTIVITAMIN WITH MINERALS) TABS tablet [id number] Yes Take 1 tablet by mouth daily. Arnetha Courser, MD Taking Active   Omega-3 Fatty Acids (FISH OIL) 1000 MG CAPS [id number] Yes Take 1 capsule by mouth 2 (two) times daily. [provider] Taking Active Other  ondansetron (ZOFRAN-ODT) 4 MG disintegrating  tablet [id number] Yes Take 1 tablet (4 mg total) by mouth every 8 (eight) hours as needed for nausea or vomiting. Benancio Deeds, MD Taking Active Pharmacy Records  potassium chloride SA (KLOR-CON M20) 20 MEQ tablet [id number] Yes TAKE TWO (2) TABLETS EACH  MORNING AND 1 TABLET EACH EVENING. Lyn Records, MD Taking Active Self, Pharmacy Records            Home Care and Equipment/Supplies: Were Home Health Services Ordered?: Yes [facility] of [facility] Agency:: [facility] Has Agency set up a time to come to your home?: No EMR reviewed for Home Health Orders:  (This Clinical research associate called [facility] and spoke with Marylene Land. They attempted to reach patient yesterday and there was no answer.  Provided patient with [facility] phone number [phone]) and they will reach out again to patient.) Any new equipment or medical supplies ordered?: No  Functional Questionnaire: Do you need assistance with bathing/showering or dressing?: No Do you need assistance with meal preparation?: No Do you need assistance with eating?: No Do you have difficulty maintaining continence: No Do you need assistance with getting out of bed/getting out of a chair/moving?: No Do you have difficulty managing or taking your medications?: No  Follow up appointments reviewed: PCP Follow-up appointment confirmed?: Yes Date of PCP follow-up appointment?: 02/07/23 Follow-up Provider: [facility] Follow-up appointment confirmed?: NA Do you need transportation to your follow-up appointment?: No Do you understand care options if your condition(s) worsen?: Yes-patient verbalized understanding  SDOH Interventions Today    Flowsheet Row Most Recent Value  SDOH Interventions   Food Insecurity Interventions Intervention Not Indicated  Transportation Interventions Intervention Not Indicated  Utilities Interventions Intervention Not Indicated      Interventions Today    Flowsheet Row Most Recent Value  Chronic Disease   Chronic disease during today's visit Chronic Kidney Disease/End Stage Renal Disease (ESRD)  General Interventions   General Interventions Discussed/Reviewed General Interventions Discussed, Doctor Visits  Doctor Visits Discussed/Reviewed Doctor Visits  Discussed  Nutrition Interventions   Nutrition Discussed/Reviewed Nutrition Discussed, Nutrition Reviewed, Supplmental nutrition       TOC Interventions Today    Flowsheet Row Most Recent Value  TOC Interventions   TOC Interventions Discussed/Reviewed TOC Interventions Discussed, Contacted Home Health RN/OT/PT       Jodelle Gross, RN, BSN, CCM Care Management Coordinator [facility] Phone: [phone]/Fax: [phone]

## 2023-02-05 LAB — ZINC: Zinc: 67 ug/dL (ref 44–115)

## 2023-02-05 LAB — COPPER, SERUM: Copper: 112 ug/dL (ref 80–158)

## 2023-02-06 LAB — VITAMIN E
Vitamin E (Alpha Tocopherol): 8.5 mg/L — ABNORMAL LOW (ref 9.0–29.0)
Vitamin E(Gamma Tocopherol): 1.4 mg/L (ref 0.5–4.9)

## 2023-02-06 LAB — VITAMIN A: Vitamin A (Retinoic Acid): 24.9 ug/dL (ref 22.0–69.5)

## 2023-02-07 ENCOUNTER — Ambulatory Visit (INDEPENDENT_AMBULATORY_CARE_PROVIDER_SITE_OTHER): Payer: Medicare Other | Admitting: Family Medicine

## 2023-02-07 ENCOUNTER — Encounter: Payer: Self-pay | Admitting: Family Medicine

## 2023-02-07 VITALS — BP 106/58 | HR 101 | Ht 61.0 in | Wt 133.4 lb

## 2023-02-07 DIAGNOSIS — E440 Moderate protein-calorie malnutrition: Secondary | ICD-10-CM | POA: Diagnosis not present

## 2023-02-07 DIAGNOSIS — D649 Anemia, unspecified: Secondary | ICD-10-CM | POA: Diagnosis not present

## 2023-02-07 DIAGNOSIS — R5381 Other malaise: Secondary | ICD-10-CM | POA: Diagnosis not present

## 2023-02-07 DIAGNOSIS — N1831 Chronic kidney disease, stage 3a: Secondary | ICD-10-CM | POA: Diagnosis not present

## 2023-02-07 DIAGNOSIS — N179 Acute kidney failure, unspecified: Secondary | ICD-10-CM

## 2023-02-07 DIAGNOSIS — Z8719 Personal history of other diseases of the digestive system: Secondary | ICD-10-CM | POA: Diagnosis not present

## 2023-02-07 DIAGNOSIS — R4586 Emotional lability: Secondary | ICD-10-CM | POA: Insufficient documentation

## 2023-02-07 NOTE — Assessment & Plan Note (Signed)
-  drinking boost supplementation daily -continue remeron, will likely need to be titrated at next visit

## 2023-02-07 NOTE — Assessment & Plan Note (Signed)
-  seems to be experiencing depressed mood which patient confirms has been ongoing prior to her recent hospitalization. Reassurance provided. Likely secondary from physical deconditioning after recent hospitalization. Reassuringly patient still able to live independently and complete all ADLs and IADLs.  -PHQ-9 score of 20 with 3 for question 9 reviewed and extensively discussed. -we discussed how I think she would benefit from PT as this would also improve her mood, she is agreeable. PT referral placed -also recommended participating in home aerobics or resistance strengthening exercises at home by watching videos which she was in favor of  -follow up in 1 month

## 2023-02-07 NOTE — Assessment & Plan Note (Deleted)
-  drinking boost supplementation daily

## 2023-02-07 NOTE — Assessment & Plan Note (Signed)
-  EGD and colonoscopy unremarkable, follow up with GI as appropriate -continue PPI -pending CBC to monitor normocytic anemia

## 2023-02-07 NOTE — Patient Instructions (Addendum)
It was great seeing you today!  Today we discussed your recent hospitalization and your mood. It can be tiring coming out of the hospital and the recovery time can take awhile to completely feel back to normal. I am glad that you remain active around the house. As we discussed, I recommend watching videos on aerobics or resistance strengthening exercises. Doing even 15-20 minutes a day can help you stay active and improve your physical state as well as your mood.  I have also placed a referral to physical therapy, I believe even a few sessions will benefit you. If you do not hear from them within 2 weeks then please contact our office so we can assist with scheduling.  Please bring all your medications to your next visit.  We will also get blood work today, I will be in touch with the results.   Please follow up at your next scheduled appointment on 6/27 at 10:30 am, if anything arises between now and then, please don't hesitate to contact our office.   Thank you for allowing [place] to be a part of your medical care!  Thank you, Dr. Robyne Peers

## 2023-02-07 NOTE — Assessment & Plan Note (Signed)
-  presents for hospital follow, no new or concerning symptoms -pending BMP to assess renal function and electrolytes -pending Mg as recent one from hospitalization abnormal -med rec reviewed and updated appropriately -scheduled follow up with PCP 6/27 for TSH and PCP follow up

## 2023-02-07 NOTE — Progress Notes (Signed)
    SUBJECTIVE:   CHIEF COMPLAINT / HPI:   Patient presents for hospital follow up. She reports that she feels the same. Feeling weak all over her body. Tries to stay hydrated, reports constantly drinking water throughout the day. Denies thoughts of harming self or anyone else, admits that sometimes she wishes she were already dead but would never harm herself. Protective factors include her religion and family. Denies ever having a plan to harm herself. Lives alone and able to do all ADLs without difficulty. But feels like she gets tired more easily. When asked, she wants to stay active and is open to PT. Denies abdominal pain, melena, hematochezia, diarrhea, nausea and vomiting.   OBJECTIVE:   BP (!) 106/58   Pulse (!) 101   Ht 5\' 1"  (1.549 m)   Wt 133 lb 6 oz (60.5 kg)   BMI 25.20 kg/m   General: Patient well-appearing, in no acute distress. HEENT: PERRLA, non-tender thyroid CV: RRR, no murmurs or gallops auscultated Resp: CTAB, no wheezing, rales or rhonchi noted Abdomen: soft, nontender, nondistended, presence of bowel sounds Ext: no LE edema noted bilaterally, distal pulses strong and equal bilaterally  Psych: mildly depressed mood, appropriate affect, denies SI or HI   ASSESSMENT/PLAN:   Acute renal failure superimposed on stage 3a chronic kidney disease (HCC) -presents for hospital follow, no new or concerning symptoms -pending BMP to assess renal function and electrolytes -pending Mg as recent one from hospitalization abnormal -med rec reviewed and updated appropriately -scheduled follow up with PCP 6/27 for TSH and PCP follow up   Protein calorie malnutrition (HCC) -drinking boost supplementation daily -continue remeron, will likely need to be titrated at next visit   History of gastric polyp -EGD and colonoscopy unremarkable, follow up with GI as appropriate -continue PPI -pending CBC to monitor normocytic anemia   Mood changes -seems to be experiencing depressed  mood which patient confirms has been ongoing prior to her recent hospitalization. Reassurance provided. Likely secondary from physical deconditioning after recent hospitalization. Reassuringly patient still able to live independently and complete all ADLs and IADLs.  -PHQ-9 score of 20 with 3 for question 9 reviewed and extensively discussed. -we discussed how I think she would benefit from PT as this would also improve her mood, she is agreeable. PT referral placed -also recommended participating in home aerobics or resistance strengthening exercises at home by watching videos which she was in favor of  -follow up in 1 month     [name] Robyne Peers, DO [facility]

## 2023-02-08 LAB — CBC
Hematocrit: 26 % — ABNORMAL LOW (ref 34.0–46.6)
Hemoglobin: 8.7 g/dL — ABNORMAL LOW (ref 11.1–15.9)
MCH: 30.2 pg (ref 26.6–33.0)
MCHC: 33.5 g/dL (ref 31.5–35.7)
MCV: 90 fL (ref 79–97)
Platelets: 423 10*3/uL (ref 150–450)
RBC: 2.88 x10E6/uL — ABNORMAL LOW (ref 3.77–5.28)
RDW: 12.7 % (ref 11.7–15.4)
WBC: 6.9 10*3/uL (ref 3.4–10.8)

## 2023-02-08 LAB — BASIC METABOLIC PANEL
BUN/Creatinine Ratio: 9 — ABNORMAL LOW (ref 12–28)
BUN: 13 mg/dL (ref 8–27)
CO2: 16 mmol/L — ABNORMAL LOW (ref 20–29)
Calcium: 9.2 mg/dL (ref 8.7–10.3)
Chloride: 100 mmol/L (ref 96–106)
Creatinine, Ser: 1.41 mg/dL — ABNORMAL HIGH (ref 0.57–1.00)
Glucose: 77 mg/dL (ref 70–99)
Potassium: 3.6 mmol/L (ref 3.5–5.2)
Sodium: 133 mmol/L — ABNORMAL LOW (ref 134–144)
eGFR: 38 mL/min/{1.73_m2} — ABNORMAL LOW (ref >59)

## 2023-02-08 LAB — MAGNESIUM: Magnesium: 1.7 mg/dL (ref 1.6–2.3)

## 2023-02-10 LAB — VITAMIN B6: Vitamin B6: 1.7 ug/L — ABNORMAL LOW (ref 3.4–65.2)

## 2023-02-13 LAB — VITAMIN B1: Vitamin B1 (Thiamine): 46.7 nmol/L — ABNORMAL LOW (ref 66.5–200.0)

## 2023-02-19 ENCOUNTER — Telehealth: Payer: Self-pay | Admitting: Family Medicine

## 2023-02-21 ENCOUNTER — Ambulatory Visit
Admission: RE | Admit: 2023-02-21 | Discharge: 2023-02-21 | Disposition: A | Discharge status: Home / Self Care | Payer: Medicare Other | Source: Ambulatory Visit | Attending: Family Medicine | Admitting: Family Medicine

## 2023-02-21 ENCOUNTER — Encounter: Payer: Self-pay | Admitting: Dietician

## 2023-02-21 DIAGNOSIS — Z1231 Encounter for screening mammogram for malignant neoplasm of breast: Secondary | ICD-10-CM

## 2023-02-21 NOTE — Progress Notes (Signed)
Nutrition Brief Note   Vitamin labs sent out while patient was hospitalized as pt with chronic diarrhea and weakness. Labs returned as follows:  RD has been unable to reach patient to provide results and make recommendations. Results sent to PCP for supplementation.   Recommend:  -Thiamine 100mg  po daily x 30 days -pyridoxine 100mg  po daily x 30 days -cholecalciferol 2000 units po daily x 60 days or ergocalciferol 50,000 units po weekly x 6 weeks - vitamin E 400 units po daily x 30 days  Recommend recheck labs 30-60 days after supplementation is completed.   Betsey Holiday MS, RD, LDN Please refer to [facility] for RD and/or RD on-call/weekend/after hours pager

## 2023-02-22 NOTE — Progress Notes (Signed)
Reviewed. Will try again to reach patient.

## 2023-02-26 ENCOUNTER — Ambulatory Visit (INDEPENDENT_AMBULATORY_CARE_PROVIDER_SITE_OTHER): Payer: Medicare Other | Admitting: *Deleted

## 2023-02-26 DIAGNOSIS — Z0000 Encounter for general adult medical examination without abnormal findings: Secondary | ICD-10-CM | POA: Diagnosis not present
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
## 2023-02-26 NOTE — Patient Instructions (Signed)
[name] , Thank you for taking time to come for your Medicare Wellness Visit. I appreciate your ongoing commitment to your health goals. Please review the following plan we discussed and let me know if I can assist you in the future.   Screening recommendations/referrals: Colonoscopy: up to date Mammogram: up to date Bone Density: Education provided Recommended yearly ophthalmology/optometry visit for glaucoma screening and checkup Recommended yearly dental visit for hygiene and checkup  Vaccinations: Influenza vaccine: up to date Pneumococcal vaccine: up to date Tdap vaccine: up to date Shingles vaccine: Education provided    Advanced directives: on file     Preventive Care 65 Years and Older, Female Preventive care refers to lifestyle choices and visits with your health care provider that can promote health and wellness. What does preventive care include? A yearly physical exam. This is also called an annual well check. Dental exams once or twice a year. Routine eye exams. Ask your health care provider how often you should have your eyes checked. Personal lifestyle choices, including: Daily care of your teeth and gums. Regular physical activity. Eating a healthy diet. Avoiding tobacco and drug use. Limiting alcohol use. Practicing safe sex. Taking low-dose aspirin every day. Taking vitamin and mineral supplements as recommended by your health care provider. What happens during an annual well check? The services and screenings done by your health care provider during your annual well check will depend on your age, overall health, lifestyle risk factors, and family history of disease. Counseling  Your health care provider may ask you questions about your: Alcohol use. Tobacco use. Drug use. Emotional well-being. Home and relationship well-being. Sexual activity. Eating habits. History of falls. Memory and ability to understand (cognition). Work and work  Astronomer. Reproductive health. Screening  You may have the following tests or measurements: Height, weight, and BMI. Blood pressure. Lipid and cholesterol levels. These may be checked every 5 years, or more frequently if you are over 78 years old. Skin check. Lung cancer screening. You may have this screening every year starting at age 27 if you have a 30-pack-year history of smoking and currently smoke or have quit within the past 15 years. Fecal occult blood test (FOBT) of the stool. You may have this test every year starting at age 66. Flexible sigmoidoscopy or colonoscopy. You may have a sigmoidoscopy every 5 years or a colonoscopy every 10 years starting at age 68. Hepatitis C blood test. Hepatitis B blood test. Sexually transmitted disease (STD) testing. Diabetes screening. This is done by checking your blood sugar (glucose) after you have not eaten for a while (fasting). You may have this done every 1-3 years. Bone density scan. This is done to screen for osteoporosis. You may have this done starting at age 57. Mammogram. This may be done every 1-2 years. Talk to your health care provider about how often you should have regular mammograms. Talk with your health care provider about your test results, treatment options, and if necessary, the need for more tests. Vaccines  Your health care provider may recommend certain vaccines, such as: Influenza vaccine. This is recommended every year. Tetanus, diphtheria, and acellular pertussis (Tdap, Td) vaccine. You may need a Td booster every 10 years. Zoster vaccine. You may need this after age 11. Pneumococcal 13-valent conjugate (PCV13) vaccine. One dose is recommended after age 30. Pneumococcal polysaccharide (PPSV23) vaccine. One dose is recommended after age 64. Talk to your health care provider about which screenings and vaccines you need and how often  you need them. This information is not intended to replace advice given to you by  your health care provider. Make sure you discuss any questions you have with your health care provider. Document Released: 09/30/2015 Document Revised: 05/23/2016 Document Reviewed: 07/05/2015 Elsevier Interactive Patient Education  2017 [employer] Prevention in the Home Falls can cause injuries. They can happen to people of all ages. There are many things you can do to make your home safe and to help prevent falls. What can I do on the outside of my home? Regularly fix the edges of walkways and driveways and fix any cracks. Remove anything that might make you trip as you walk through a door, such as a raised step or threshold. Trim any bushes or trees on the path to your home. Use bright outdoor lighting. Clear any walking paths of anything that might make someone trip, such as rocks or tools. Regularly check to see if handrails are loose or broken. Make sure that both sides of any steps have handrails. Any raised decks and porches should have guardrails on the edges. Have any leaves, snow, or ice cleared regularly. Use sand or salt on walking paths during winter. Clean up any spills in your garage right away. This includes oil or grease spills. What can I do in the bathroom? Use night lights. Install grab bars by the toilet and in the tub and shower. Do not use towel bars as grab bars. Use non-skid mats or decals in the tub or shower. If you need to sit down in the shower, use a plastic, non-slip stool. Keep the floor dry. Clean up any water that spills on the floor as soon as it happens. Remove soap buildup in the tub or shower regularly. Attach bath mats securely with double-sided non-slip rug tape. Do not have throw rugs and other things on the floor that can make you trip. What can I do in the bedroom? Use night lights. Make sure that you have a light by your bed that is easy to reach. Do not use any sheets or blankets that are too big for your bed. They should not hang  down onto the floor. Have a firm chair that has side arms. You can use this for support while you get dressed. Do not have throw rugs and other things on the floor that can make you trip. What can I do in the kitchen? Clean up any spills right away. Avoid walking on wet floors. Keep items that you use a lot in easy-to-reach places. If you need to reach something above you, use a strong step stool that has a grab bar. Keep electrical cords out of the way. Do not use floor polish or wax that makes floors slippery. If you must use wax, use non-skid floor wax. Do not have throw rugs and other things on the floor that can make you trip. What can I do with my stairs? Do not leave any items on the stairs. Make sure that there are handrails on both sides of the stairs and use them. Fix handrails that are broken or loose. Make sure that handrails are as long as the stairways. Check any carpeting to make sure that it is firmly attached to the stairs. Fix any carpet that is loose or worn. Avoid having throw rugs at the top or bottom of the stairs. If you do have throw rugs, attach them to the floor with carpet tape. Make sure that you have a light  switch at the top of the stairs and the bottom of the stairs. If you do not have them, ask someone to add them for you. What else can I do to help prevent falls? Wear shoes that: Do not have high heels. Have rubber bottoms. Are comfortable and fit you well. Are closed at the toe. Do not wear sandals. If you use a stepladder: Make sure that it is fully opened. Do not climb a closed stepladder. Make sure that both sides of the stepladder are locked into place. Ask someone to hold it for you, if possible. Clearly mark and make sure that you can see: Any grab bars or handrails. First and last steps. Where the edge of each step is. Use tools that help you move around (mobility aids) if they are needed. These  include: Canes. Walkers. Scooters. Crutches. Turn on the lights when you go into a dark area. Replace any light bulbs as soon as they burn out. Set up your furniture so you have a clear path. Avoid moving your furniture around. If any of your floors are uneven, fix them. If there are any pets around you, be aware of where they are. Review your medicines with your doctor. Some medicines can make you feel dizzy. This can increase your chance of falling. Ask your doctor what other things that you can do to help prevent falls. This information is not intended to replace advice given to you by your health care provider. Make sure you discuss any questions you have with your health care provider. Document Released: 06/30/2009 Document Revised: 02/09/2016 Document Reviewed: 10/08/2014 Elsevier Interactive Patient Education  2017 [employer].

## 2023-02-27 ENCOUNTER — Encounter: Payer: Self-pay | Admitting: *Deleted

## 2023-03-04 NOTE — Telephone Encounter (Signed)
error 

## 2023-03-13 DIAGNOSIS — E038 Other specified hypothyroidism: Secondary | ICD-10-CM | POA: Insufficient documentation

## 2023-03-13 DIAGNOSIS — R7989 Other specified abnormal findings of blood chemistry: Secondary | ICD-10-CM | POA: Insufficient documentation

## 2023-03-13 NOTE — Progress Notes (Signed)
[name] is alone Sources of clinical information for visit is/are patient and past medical records. Nursing assessment for this office visit was reviewed with the patient for accuracy and revision.     Previous Report(s) Reviewed: Discharge Summary 01/28/23     03/14/2023   10:03 AM  Depression screen PHQ 2/9  Decreased Interest 2  Down, Depressed, Hopeless 0  PHQ - 2 Score 2  Altered sleeping 1  Tired, decreased energy 2  Change in appetite 2  Feeling bad or failure about yourself  2  Trouble concentrating 2  Moving slowly or fidgety/restless 0  Suicidal thoughts 0  PHQ-9 Score 11   Flowsheet Row Office Visit from 03/14/2023 in [facility] Clinical Support from 02/26/2023 in [facility] Office Visit from 02/07/2023 in [facility]  Thoughts that you would be better off dead, or of hurting yourself in some way Not at all Not at all Nearly every day  PHQ-9 Total Score 11 6 20           03/14/2023   10:02 AM 02/26/2023   11:17 AM 02/07/2023   10:26 AM 01/03/2023   10:27 AM 10/25/2022    9:40 AM  Fall Risk   Falls in the past year? 0 0 0 0 0  Number falls in past yr: 0 0 0 0 0  Injury with Fall? 0 0 0 0 0  Follow up  Falls evaluation completed;Education provided;Falls prevention discussed          03/14/2023   10:03 AM 02/26/2023   11:22 AM 02/07/2023   10:25 AM  PHQ9 SCORE ONLY  PHQ-9 Total Score 11 6 20     There are no preventive care reminders to display for this patient.  Health Maintenance Due  Topic Date Due   Zoster Vaccines- Shingrix (1 of 2) Never done      History/P.E. limitations: memory difficulties  There are no preventive care reminders to display for this patient. There are no preventive care reminders to display for this patient.  Health Maintenance Due  Topic Date Due   Zoster Vaccines- Shingrix (1 of 2) Never done     Chief Complaint  Patient presents with   [facility] f/u      --------------------------------------------------------------------------------------------------------------------------------------------- Visit Problem List with A/P  No problem-specific Assessment & Plan notes found for this encounter.    02/07/23 office visit with Dr Robyne Peers for hospital follow up.  Patient presents for hospital follow up. She reports that she feels the same. Feeling weak all over her body. Tries to stay hydrated, reports constantly drinking water throughout the day. Denies thoughts of harming self or anyone else, admits that sometimes she wishes she were already dead but would never harm herself. Protective factors include her religion and family. Denies ever having a plan to harm herself. Lives alone and able to do all ADLs without difficulty. But feels like she gets tired more easily. When asked, she wants to stay active and is open to PT. Denies abdominal pain, melena, hematochezia, diarrhea, nausea and vomiting.    01/28/23 - 02/01/23 Hospitial course  Recommendations at discharge:  Please obtain CBC and BMP in 1 week Please repeat TSH in 3 to 4 weeks-patient was having low TSH with normal thyroid profile and antibodies. Patient need titration of Remeron Follow-up with gastroenterology Follow-up with primary care provider Please follow-up the results for some nutritional deficiencies as they were pending at the time of discharge.  Discharge Diagnoses: Principal Problem:   Acute renal failure superimposed on stage 3a chronic kidney disease (HCC) Active Problems:   Loss of weight   Inadequate oral intake   Collagenous colitis   Protein calorie malnutrition (HCC)   Hypokalemia   Essential hypertension   History of gastric polyp   Thrombocytosis   Normocytic anemia   Unable to accept blood transfusions as patient is Jehovah's Witness   Hyponatremia   AKI (acute kidney injury) (HCC)   Malnutrition of moderate degree     Hospital Course: Taken from H&P.    [name] is a 77 y.o. 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  It is associated with generalized weakness.  Patient continues to have poor appetite and poor oral intake.  She has chronic diarrhea that is no worse than her baseline.    ED course and data review: Vitals within normal limits.  Labs notable for creatinine of 3.08 up from baseline of 1.17 with bicarb of 20 and potassium 3.3.  Sodium 131.  Hemoglobin 11.2 which is improved from baseline EKG. showing NSR at 96 with nonspecific ST-T wave changes CT abdomen and pelvis consistent with chronic colitis. Patient hydrated with a 1 L LR bolus.   5/15: Vital stable.  Labs with improvement of creatinine to 2.03, hypokalemia with potassium of 3.2.  Checking TSH to rule out other options for excessive weight loss. Per GI note recent EGD and biopsies of polypectomy sites were benign. Will get benefit from CT chest abdomen and pelvis with contrast once renal function improved. Going for repeat EGD and colonoscopy tomorrow.  Also started on Remeron-does need to be titrated by PCP.  TSH was low so hyperthyroidism can be the cause-checking thyroid panel.   5/16: Vital stable, renal functions continue to improve with creatinine at 1.58 today.  Persistent hypokalemia with potassium of 3.1, magnesium normal.  Thyroid panel with low TSH but normal total T4 and free thyroxine.  T3 uptake ratio within normal range of 33%.  Thyroid antibodies negative.  EGD and colonoscopy got postponed for tomorrow due to incomplete prep   5/17: Remained hemodynamically stable.  Had her EGD and colonoscopy today which were normal and  GI signed off.  Patient with some improvement in appetite and tolerating diet without any nausea and vomiting.  She can use Zofran as needed for nausea and Imodium as needed for diarrhea.  She need to follow-up with her gastroenterologist closely for further recommendations.   Patient need to have a repeat TSH checked in 3 to 4 weeks by primary care provider and they can refer to see endocrinologist if appropriate and needed.   Primary care provider can titrate the dose of Remeron.   Patient will continue the rest of her home medications except the spironolactone as mentioned above and follow-up with her providers closely for further recommendations.   Assessment and Plan: * Acute renal failure superimposed on stage 3a chronic kidney disease (HCC) Creatinine above 3, baseline 1.17, improving with IV fluid, currently at 2.03 -Check renal ultrasound -Hydrate and monitor   Inadequate oral intake Unintentional weight loss, acute on chronic Protein calorie malnutrition, unspecified IV hydration Nutritionist consult GI  to repeat EGD and colonoscopy tomorrow   Loss of weight Patient has unintentional weight loss.  Workup so far is negative.  Recent EGD at prior polypectomy site was negative. GI was consulted and patient will be going a repeat EGD and colonoscopy tomorrow. CT abdomen and pelvis without contrast was without any significant abnormality, did shows chronic colitis.  TSH low Will get benefit from CT chest, abdomen and pelvis with contrast once renal function are improved to rule out any underlying lesion -Started her on Remeron-does need to be titrated by PCP -Thyroid panel with low TSH only, T4 and T3 was normal.  Thyroid antibodies negative.   Protein calorie malnutrition (HCC) Estimated body mass index is 23.69 kg/m as calculated from the following:   Height as of this encounter: 5\' 1"  (1.549 m).   Weight as of this encounter: 56.9 kg.    -Dietitian consult   Collagenous  colitis Chronic and stable Continue loperamide   Hypokalemia Potassium at 3.2 today.  Normal magnesium and phosphorus -Replete potassium and monitor   Essential hypertension Continue metoprolol.  Will hold spironolactone   History of gastric polyp History of chronic gastritis IV Protonix GI consult   Thrombocytosis Appears chronic   Unable to accept blood transfusions as patient is Jehovah's Witness Jehovah's Witness   Normocytic anemia Appears stable, slight decrease in hemoglobin most likely dilutional effect. -Monitor hemoglobin   Hyponatremia Likely secondary to dehydration, resolved with IV fluid Continue to monitor   Hospital Labs Low Vitamins B1, B6, Vit E serum levels

## 2023-03-14 ENCOUNTER — Encounter: Payer: Self-pay | Admitting: Family Medicine

## 2023-03-14 ENCOUNTER — Ambulatory Visit (INDEPENDENT_AMBULATORY_CARE_PROVIDER_SITE_OTHER): Payer: Medicare Other | Admitting: Family Medicine

## 2023-03-14 VITALS — BP 125/67 | HR 85 | Ht 61.0 in | Wt 138.0 lb

## 2023-03-14 DIAGNOSIS — R634 Abnormal weight loss: Secondary | ICD-10-CM

## 2023-03-14 DIAGNOSIS — E639 Nutritional deficiency, unspecified: Secondary | ICD-10-CM

## 2023-03-14 DIAGNOSIS — D649 Anemia, unspecified: Secondary | ICD-10-CM | POA: Diagnosis not present

## 2023-03-14 DIAGNOSIS — E441 Mild protein-calorie malnutrition: Secondary | ICD-10-CM

## 2023-03-14 DIAGNOSIS — R7989 Other specified abnormal findings of blood chemistry: Secondary | ICD-10-CM

## 2023-03-14 NOTE — Patient Instructions (Addendum)
   Your weight is up 5 pounds since May 23rd.. This is a very good thing.You had low vitamin levels B1, B6, and Vitamin E.  Please start taking Centrum Silver for Women multivitamin.   We will recheck you vitamin levels in 3 months.   Once your vitamin levels are back to normal, we will recheck your anemia to see if it has improved.      We are checking your thyroid today because it was abnormal when you were sick in the hospital.  Usually, the thyroid gets back to normal once the illness is over.   We are checking your kidneys today.

## 2023-03-15 ENCOUNTER — Encounter: Payer: Self-pay | Admitting: Family Medicine

## 2023-03-15 DIAGNOSIS — E639 Nutritional deficiency, unspecified: Secondary | ICD-10-CM | POA: Insufficient documentation

## 2023-03-15 LAB — CBC WITH DIFFERENTIAL/PLATELET
Basophils Absolute: 0 10*3/uL (ref 0.0–0.2)
Basos: 1 %
EOS (ABSOLUTE): 0.2 10*3/uL (ref 0.0–0.4)
Eos: 4 %
Hematocrit: 24.9 % — ABNORMAL LOW (ref 34.0–46.6)
Hemoglobin: 8.2 g/dL — ABNORMAL LOW (ref 11.1–15.9)
Immature Grans (Abs): 0.1 10*3/uL (ref 0.0–0.1)
Immature Granulocytes: 1 %
Lymphocytes Absolute: 1.4 10*3/uL (ref 0.7–3.1)
Lymphs: 24 %
MCH: 30.1 pg (ref 26.6–33.0)
MCHC: 32.9 g/dL (ref 31.5–35.7)
MCV: 92 fL (ref 79–97)
Monocytes Absolute: 0.6 10*3/uL (ref 0.1–0.9)
Monocytes: 11 %
Neutrophils Absolute: 3.6 10*3/uL (ref 1.4–7.0)
Neutrophils: 59 %
Platelets: 480 10*3/uL — ABNORMAL HIGH (ref 150–450)
RBC: 2.72 x10E6/uL — CL (ref 3.77–5.28)
RDW: 13.7 % (ref 11.7–15.4)
WBC: 6 10*3/uL (ref 3.4–10.8)

## 2023-03-15 LAB — CMP14+EGFR
ALT: 14 IU/L (ref 0–32)
AST: 22 IU/L (ref 0–40)
Albumin: 3.1 g/dL — ABNORMAL LOW (ref 3.8–4.8)
Alkaline Phosphatase: 96 IU/L (ref 44–121)
BUN/Creatinine Ratio: 9 — ABNORMAL LOW (ref 12–28)
BUN: 11 mg/dL (ref 8–27)
Bilirubin Total: <0.2 mg/dL (ref 0.0–1.2)
CO2: 19 mmol/L — ABNORMAL LOW (ref 20–29)
Calcium: 8.7 mg/dL (ref 8.7–10.3)
Chloride: 106 mmol/L (ref 96–106)
Creatinine, Ser: 1.22 mg/dL — ABNORMAL HIGH (ref 0.57–1.00)
Globulin, Total: 3.1 g/dL (ref 1.5–4.5)
Glucose: 78 mg/dL (ref 70–99)
Potassium: 4.1 mmol/L (ref 3.5–5.2)
Sodium: 138 mmol/L (ref 134–144)
Total Protein: 6.2 g/dL (ref 6.0–8.5)
eGFR: 46 mL/min/{1.73_m2} — ABNORMAL LOW (ref >59)

## 2023-03-15 LAB — T4F: T4,Free (Direct): 1.1 ng/dL (ref 0.82–1.77)

## 2023-03-15 LAB — TSH RFX ON ABNORMAL TO FREE T4: TSH: 0.385 u[IU]/mL — ABNORMAL LOW (ref 0.450–4.500)

## 2023-03-15 LAB — RETICULOCYTES: Retic Ct Pct: 2.4 % (ref 0.6–2.6)

## 2023-03-15 NOTE — Assessment & Plan Note (Addendum)
Persistently low TSH since mid-May Normal FT4 level.   Euthyroid Sick Vs Subclinical hyperthyroidism Need to check FT3 next visit.

## 2023-03-15 NOTE — Assessment & Plan Note (Signed)
Established problem worsened.     Latest Ref Rng & Units 03/14/2023   11:59 AM 02/07/2023   12:22 PM 01/30/2023    4:14 AM  CBC  WBC 3.4 - 10.8 x10E3/uL 6.0  6.9  5.9   Hemoglobin 11.1 - 15.9 g/dL 8.2  8.7  8.4   Hematocrit 34.0 - 46.6 % 24.9  26.0  24.5   Platelets 150 - 450 x10E3/uL 480  423  404     Normal Ferritin 85 in hospital.  Vit B deficiencies and Vit E deficiency.  Retic today is 2.4 Total Bili normal. No melena/BRBPR  Plan is for patient to take Multivitamin for 3 months then recheck CBC

## 2023-03-15 NOTE — Assessment & Plan Note (Signed)
New problem Working explanation of Vit b1, B6, and Vit E deficiencies are related to poor oral intake. If unable to replete, will need to look for malabsorption.   Plan Multivitamin daily x 3 months then recheck levels.

## 2023-03-15 NOTE — Assessment & Plan Note (Signed)
Established problem that has improved with increase of weight over last month of 5 pounds.  She is taking Remerron and nutritional shakes.    Continue current medication regiment.

## 2023-03-15 NOTE — Assessment & Plan Note (Signed)
Established problem Improved oral intake and increase 5 pounds in last month.  Albumin lower at 3.1 Monitor plan

## 2023-03-19 ENCOUNTER — Telehealth: Payer: Self-pay | Admitting: Family Medicine

## 2023-03-19 DIAGNOSIS — R7989 Other specified abnormal findings of blood chemistry: Secondary | ICD-10-CM

## 2023-03-19 NOTE — Telephone Encounter (Signed)
We discussed low hgb and higher plt, decline in serum creatinine, and persistent low TSH with nl FT4 and its possible indication of subclinical hyperthyroidism.   Ms [name] was not on Biotin with draw of TSH/FT4. She is now taking Centrum silver which contains Biotin to treat her Vit B1, Vit B6 and Vit E low serum levels.    She has started taking her MVI daily Encouraged drinking 4 eight ouce glasses of liquid daily  Given Ms [name]'s recent weight loss, it seems prudent to ask endocrinology to evaluated Ms [name]'s thyroid function, and decided if an intervention is warranted.   Ms [name] was not on Biotin with draw of TSH/FT4. She is now on Centrum silver which contains Biotin

## 2023-03-20 ENCOUNTER — Encounter: Payer: Self-pay | Admitting: Internal Medicine

## 2023-03-20 ENCOUNTER — Ambulatory Visit: Payer: Medicare Other | Attending: Internal Medicine | Admitting: Internal Medicine

## 2023-03-20 ENCOUNTER — Telehealth: Payer: Self-pay | Admitting: Internal Medicine

## 2023-03-20 VITALS — BP 120/58 | HR 91 | Ht 61.0 in | Wt 133.0 lb

## 2023-03-20 DIAGNOSIS — M791 Myalgia, unspecified site: Secondary | ICD-10-CM

## 2023-03-20 DIAGNOSIS — I10 Essential (primary) hypertension: Secondary | ICD-10-CM | POA: Diagnosis not present

## 2023-03-20 DIAGNOSIS — I2584 Coronary atherosclerosis due to calcified coronary lesion: Secondary | ICD-10-CM | POA: Diagnosis not present

## 2023-03-20 DIAGNOSIS — I471 Supraventricular tachycardia, unspecified: Secondary | ICD-10-CM | POA: Diagnosis not present

## 2023-03-20 DIAGNOSIS — N1831 Chronic kidney disease, stage 3a: Secondary | ICD-10-CM

## 2023-03-20 DIAGNOSIS — E782 Mixed hyperlipidemia: Secondary | ICD-10-CM

## 2023-03-20 DIAGNOSIS — T466X5A Adverse effect of antihyperlipidemic and antiarteriosclerotic drugs, initial encounter: Secondary | ICD-10-CM

## 2023-03-20 DIAGNOSIS — I493 Ventricular premature depolarization: Secondary | ICD-10-CM

## 2023-03-20 DIAGNOSIS — I2510 Atherosclerotic heart disease of native coronary artery without angina pectoris: Secondary | ICD-10-CM | POA: Diagnosis not present

## 2023-03-20 DIAGNOSIS — I700 Atherosclerosis of aorta: Secondary | ICD-10-CM

## 2023-03-20 HISTORY — DX: Supraventricular tachycardia, unspecified: I47.10

## 2023-03-20 NOTE — Progress Notes (Signed)
Cardiology Office Note:  .    Date:  03/20/2023  ID:  [name], DOB 01-17-1946, MRN [id number] PCP: McDiarmid, Leighton Roach, MD  [name] Cardiologist:  Lesleigh Noe, MD (Inactive)     CC: Transition to new cardiologist  History of Present Illness: .    [name] is a 77 y.o. female HTN, HLD and CAC, and aortic atherosclerosis complicated by statin myalgia, and PVCs.  Patient notes that she is doing better.   Since last visit notes that she was keeping her granddaughter, but hasn't since she had May hospitalization for dehydration.  Now feels like herself There are no interval hospital/ED visit.    Rare resting chest pressures ~ once a year.  No exertional symptoms.  No SOB/DOE and no PND/Orthopnea.  No weight gain or leg swelling. Rare palpitations that improves with metoprolol. Has had poor appetite   Relevant histories: .  Social: Erie Noe recommended me to her.   2023: Sees lipid clinic did not have Repatha related rash, has financial issues with getting PCKS9i.  Alden Server Increase her metoprolol for palpitations. ROS: As per HPI.   Studies Reviewed: .   Cardiac Studies & Procedures     STRESS TESTS  MYOCARDIAL PERFUSION IMAGING 03/22/2020  Narrative  The left ventricular ejection fraction is hyperdynamic (>65%).  Nuclear stress EF: 76%.  T wave inversion was noted during stress in the II, III, aVF, V3, V4, V5 and V6 leads.  The study is normal.  This is a low risk study.  ST-T nonspecific changes again seen with lexiscan infusion, similar to prior. On this scan, there is no evidence of perfusion defect at rest or with stress. Low risk study.     MONITORS  LONG TERM MONITOR (3-14 DAYS) 05/14/2022  Narrative   Basic underlying rhythm is sinus with average heart rate 67 bpm.   8 beat SVT at 140 bpm   SVT occurred in the timeframe of symptomatic events.   Isolated PVCs and PACs were rare   No atrial fibrillation.   Patch Wear Time:   14 days and 0 hours (2023-07-27T11:33:59-0400 to 2023-08-10T11:34:11-398)  Patient had a min HR of 49 bpm, max HR of 139 bpm, and avg HR of 67 bpm. Predominant underlying rhythm was Sinus Rhythm. 5 Supraventricular Tachycardia runs occurred, the run with the fastest interval lasting 8 beats with a max rate of 139 bpm, the longest lasting 7 beats with an avg rate of 103 bpm. Supraventricular Tachycardia was detected within +/- 45 seconds of symptomatic patient event(s). Isolated SVEs were rare (<1.0%), SVE Couplets were rare (<1.0%), and SVE Triplets were rare (<1.0%). Isolated VEs were rare (<1.0%), VE Couplets were rare (<1.0%), and no VE Triplets were present. Ventricular Bigeminy and Trigeminy were present.   CT SCANS  CT CARDIAC SCORING (SELF PAY ONLY) 04/26/2022  Addendum 04/26/2022 11:35 AM ADDENDUM REPORT: 04/26/2022 11:33  EXAM: OVER-READ INTERPRETATION  CT CHEST  The following report is an over-read performed by radiologist Dr. Neita Garnet of [facility] on 04/26/2022. This over-read does not include interpretation of cardiac or coronary anatomy or pathology. The coronary calcium score interpretation by the cardiologist is attached.  COMPARISON:  Chest two views 02/16/2020  FINDINGS: Cardiovascular: No aneurysmal dilatation of the visualized proximal ascending aorta or distal descending aorta.  Mediastinum/Nodes: There are no enlarged lymph nodes within the visualized mediastinum.  Lungs/Pleura: There is no pleural effusion. The visualized lungs appear clear.  Upper abdomen: There are  two partially visualized parallel linear hyper densities measuring up to approximately 14 mm within the mid stomach. These are nonspecific and may represent pills. No similar high density structure is seen on 02/16/2020 prior chest radiographs or 05/17/2013 remote abdominal CT.  Musculoskeletal/Chest wall: Mild-to-moderate multilevel degenerative disc changes of the  visualized thoracic spine.  IMPRESSION: No significant extracardiac findings within the visualized chest.  There are two parallel, linear, small possible metallic densities within the mid aspect of the stomach that may represent dense pills 21 or less likely a prior procedure in this region. Recommend clinical correlation.   Electronically Signed By: Neita Garnet M.D. On: 04/26/2022 11:33  Narrative CLINICAL DATA:  Cardiovascular Disease Risk stratification  EXAM: Coronary Calcium Score  TECHNIQUE: A gated, non-contrast computed tomography scan of the heart was performed using 3 mm slice thickness. Axial images were analyzed on a dedicated workstation. Calcium scoring of the coronary arteries was performed using the Agatston method.  FINDINGS: Coronary arteries: Normal origins.  Coronary Calcium Score:  Left main: 0  Left anterior descending artery: 123  Left circumflex artery: 45  Right coronary artery: 127  Total: 296  Percentile: 84th  Pericardium: Normal.  Aorta: Normal caliber.  Aortic atherosclerosis.  Posterior mitral annular calcification.  Non-cardiac: See separate report from [facility].  IMPRESSION: 1. Coronary calcium score of 296. This was 84th percentile for age-, race-, and sex-matched controls. 2. Aortic atherosclerosis. 3. Posterior mitral annular calcification.  RECOMMENDATIONS: Coronary artery calcium (CAC) score is a strong predictor of incident coronary heart disease (CHD) and provides predictive information beyond traditional risk factors. CAC scoring is reasonable to use in the decision to withhold, postpone, or initiate statin therapy in intermediate-risk or selected borderline-risk asymptomatic adults (age 72-75 years and LDL-C >=70 to <190 mg/dL) who do not have diabetes or established atherosclerotic cardiovascular disease (ASCVD).* In intermediate-risk (10-year ASCVD risk >=7.5% to <20%) adults or selected  borderline-risk (10-year ASCVD risk >=5% to <7.5%) adults in whom a CAC score is measured for the purpose of making a treatment decision the following recommendations have been made:  If CAC=0, it is reasonable to withhold statin therapy and reassess in 5 to 10 years, as long as higher risk conditions are absent (diabetes mellitus, family history of premature CHD in first degree relatives (males <55 years; females <65 years), cigarette smoking, or LDL >=190 mg/dL).  If CAC is 1 to 99, it is reasonable to initiate statin therapy for patients >=62 years of age.  If CAC is >=100 or >=75th percentile, it is reasonable to initiate statin therapy at any age.  Cardiology referral should be considered for patients with CAC scores >=400 or >=75th percentile.  *2018 AHA/ACC/AACVPR/AAPA/ABC/ACPM/ADA/AGS/APhA/ASPC/NLA/PCNA Guideline on the Management of Blood Cholesterol: A Report of the American College of Cardiology/American Heart Association Task Force on Clinical Practice Guidelines. J Am Coll Cardiol. 2019;73(24):3168-3209.  Zoila Shutter, MD  Electronically Signed: By: Chrystie Nose M.D. On: 04/25/2022 20:42          Physical Exam:    VS:  BP (!) 120/58   Pulse 91   Ht 5\' 1"  (1.549 m)   Wt 133 lb (60.3 kg)   SpO2 99%   BMI 25.13 kg/m    Wt Readings from Last 3 Encounters:  03/20/23 133 lb (60.3 kg)  03/14/23 138 lb (62.6 kg)  02/07/23 133 lb 6 oz (60.5 kg)    Gen: No distress, Elderly female   Neck: No JVD,  Cardiac: No Rubs or Gallops, No  murmur, RRR +2 radial pulses Respiratory: Clear to auscultation bilaterally, normal effort, normal  respiratory rate GI: Soft, nontender, non-distended  MS: No  edema;  moves all extremities Integument: Skin feels warm Neuro:  At time of evaluation, alert and oriented to person/place/time/situation  Psych: Normal affect, patient feels well  ASSESSMENT AND PLAN: .     Paroxsymal SVT (rare) Isolated PVCs - controlled with  metoprolol 50 mg PO daily and 25 mg taken nightly (succinate dose, felt fatigue on 75 mg PO daily)  HTN CKD stage IIIb - continue BB  HLD CAC, MAC, and aortic atherosclerosis - complicated by statin myalgia - Continue repatha - Discussed strength building   One year with me or Beatrix Fetters, MD [facility] Cardiologist [facility]  [address] [place], [address] [place], [place] [address] [phone]  8:37 AM

## 2023-03-20 NOTE — Telephone Encounter (Signed)
Called pt advised our office does not prescribe Remeron.  Advised pt to f/u with PCP.  All questions answered no further concerns at this time.

## 2023-03-20 NOTE — Patient Instructions (Signed)
Medication Instructions:  Your physician recommends that you continue on your current medications as directed. Please refer to the Current Medication list given to you today.  *If you need a refill on your cardiac medications before your next appointment, please call your pharmacy*   Lab Work: NONE If you have labs (blood work) drawn today and your tests are completely normal, you will receive your results only by: MyChart Message (if you have MyChart) OR A paper copy in the mail If you have any lab test that is abnormal or we need to change your treatment, we will call you to review the results.   Testing/Procedures: NONE   Follow-Up: At [facility], you and your health needs are our priority.  As part of our continuing mission to provide you with exceptional heart care, we have created designated Provider Care Teams.  These Care Teams include your primary Cardiologist (physician) and Advanced Practice Providers (APPs -  Physician Assistants and Nurse Practitioners) who all work together to provide you with the care you need, when you need it.   Your next appointment:   1 year(s)  Provider:   Mahesh Chandrasekhar, MD     

## 2023-03-20 NOTE — Telephone Encounter (Signed)
Pt c/o medication issue:  1. Name of Medication: mirtazapine (REMERON SOL-TAB) 15 MG disintegrating tablet   2. How are you currently taking this medication (dosage and times per day)?   3. Are you having a reaction (difficulty breathing--STAT)?   4. What is your medication issue? Patient is requesting call back to see if she can change to taking this medication every other night. Please advise.

## 2023-04-16 ENCOUNTER — Other Ambulatory Visit: Payer: Self-pay | Admitting: Family Medicine

## 2023-04-16 DIAGNOSIS — F39 Unspecified mood [affective] disorder: Secondary | ICD-10-CM

## 2023-04-16 DIAGNOSIS — R634 Abnormal weight loss: Secondary | ICD-10-CM

## 2023-04-16 MED ORDER — MIRTAZAPINE 15 MG PO TBDP
15.0000 mg | ORAL_TABLET | Freq: Every day | ORAL | 1 refills | Status: DC
Start: 2023-04-16 — End: 2023-08-02

## 2023-04-29 ENCOUNTER — Telehealth: Payer: Self-pay | Admitting: Internal Medicine

## 2023-04-29 DIAGNOSIS — I700 Atherosclerosis of aorta: Secondary | ICD-10-CM

## 2023-04-29 DIAGNOSIS — I2510 Atherosclerotic heart disease of native coronary artery without angina pectoris: Secondary | ICD-10-CM

## 2023-04-29 DIAGNOSIS — E785 Hyperlipidemia, unspecified: Secondary | ICD-10-CM

## 2023-04-29 NOTE — Telephone Encounter (Signed)
Pt c/o medication issue:  1. Name of Medication:   Evolocumab with Infusor (REPATHA PUSHTRONEX SYSTEM) 420 MG/3.5ML SOCT    2. How are you currently taking this medication (dosage and times per day)? As written   3. Are you having a reaction (difficulty breathing--STAT)? No   4. What is your medication issue?  Pt states she spoke to her pharmacy and they are unable to refill her medication because they do not have it in stock. [facility] and she is unable to go there. Please advise what pt should do.

## 2023-04-30 MED ORDER — REPATHA SURECLICK 140 MG/ML ~~LOC~~ SOAJ
1.0000 mL | SUBCUTANEOUS | 5 refills | Status: DC
Start: 2023-04-30 — End: 2023-05-08

## 2023-04-30 NOTE — Telephone Encounter (Signed)
Called and spoke with patient. Advised that Repatha infuser was being discontinued and she will need to be switched to the pens. Patient scared of needles. Discussed over the phone that this medication is injected once every 2 weeks. Discussed storage, site selection, and administration. Patient will call back if she has nay issues.

## 2023-05-03 ENCOUNTER — Other Ambulatory Visit: Payer: Self-pay

## 2023-05-03 DIAGNOSIS — E876 Hypokalemia: Secondary | ICD-10-CM

## 2023-05-03 MED ORDER — POTASSIUM CHLORIDE CRYS ER 20 MEQ PO TBCR
EXTENDED_RELEASE_TABLET | ORAL | 3 refills | Status: DC
Start: 2023-05-03 — End: 2023-08-12

## 2023-05-08 ENCOUNTER — Telehealth: Payer: Self-pay | Admitting: Pharmacist

## 2023-05-08 DIAGNOSIS — I700 Atherosclerosis of aorta: Secondary | ICD-10-CM

## 2023-05-08 DIAGNOSIS — I2510 Atherosclerotic heart disease of native coronary artery without angina pectoris: Secondary | ICD-10-CM

## 2023-05-08 DIAGNOSIS — E785 Hyperlipidemia, unspecified: Secondary | ICD-10-CM

## 2023-05-08 MED ORDER — REPATHA SURECLICK 140 MG/ML ~~LOC~~ SOAJ
1.0000 mL | SUBCUTANEOUS | 11 refills | Status: DC
Start: 2023-05-08 — End: 2024-04-28

## 2023-05-08 NOTE — Telephone Encounter (Signed)
[facility] that [facility] is [facility] alternative med to Repatha since she is allergic. Pt has been on Repatha for 4 years. The formulation changed from the pushtronix to the sureclick pen since the manufacturer stopped making the pushtronix, but the medication is the same. Called pt to clarify. Reports bumps on her upper arm that are itchy. She injects Repatha into her leg, doesn't have bumps or itching there. States she's had the bumps for a while. Then states she can't give herself injections. It looks like she has been prescribed this formulation in the past. Advised that she already had myalgias on 4 statins and her throat closed on Nexlizet, and her insurance doesn't cover Leqvio well, do not have other options for her. Advised she can come to clinic and we can walk her through first injection, but do not administer this medication for patients long-term.

## 2023-05-13 NOTE — Telephone Encounter (Signed)
Pt called back, states she picked up Repatha, has a friend who will help with injection. Wants to stop by clinic with her friend so that they can learn how to give injection. She needs to see whey they are available to come in and will call me at that time to confirm we have availability.

## 2023-05-13 NOTE — Telephone Encounter (Signed)
Pt will come this Thursday at 11:30am with her friend and first Repatha dose. She is aware she doesn't have PharmD appt scheduled, and will ask the front desk to call back to PharmD to let them know when she's here.

## 2023-05-16 NOTE — Telephone Encounter (Signed)
Patient was in with her friend and received the 1st dose of Repatha. Friend had injected the dose under my supervision. Appropriate Administration steps were followed by the friend.

## 2023-06-20 ENCOUNTER — Ambulatory Visit: Payer: Medicare Other | Admitting: Internal Medicine

## 2023-07-26 ENCOUNTER — Encounter: Payer: Self-pay | Admitting: Gastroenterology

## 2023-08-01 ENCOUNTER — Encounter: Payer: Self-pay | Admitting: Family Medicine

## 2023-08-01 ENCOUNTER — Ambulatory Visit (INDEPENDENT_AMBULATORY_CARE_PROVIDER_SITE_OTHER): Payer: Medicare Other | Admitting: Family Medicine

## 2023-08-01 VITALS — BP 129/64 | HR 73 | Ht 61.0 in | Wt 143.2 lb

## 2023-08-01 DIAGNOSIS — F39 Unspecified mood [affective] disorder: Secondary | ICD-10-CM

## 2023-08-01 DIAGNOSIS — D649 Anemia, unspecified: Secondary | ICD-10-CM | POA: Diagnosis not present

## 2023-08-01 DIAGNOSIS — R0600 Dyspnea, unspecified: Secondary | ICD-10-CM | POA: Diagnosis not present

## 2023-08-01 DIAGNOSIS — M79606 Pain in leg, unspecified: Secondary | ICD-10-CM | POA: Diagnosis not present

## 2023-08-01 DIAGNOSIS — D75839 Thrombocytosis, unspecified: Secondary | ICD-10-CM

## 2023-08-01 DIAGNOSIS — I10 Essential (primary) hypertension: Secondary | ICD-10-CM | POA: Diagnosis not present

## 2023-08-01 DIAGNOSIS — E7800 Pure hypercholesterolemia, unspecified: Secondary | ICD-10-CM

## 2023-08-01 DIAGNOSIS — N1831 Chronic kidney disease, stage 3a: Secondary | ICD-10-CM | POA: Diagnosis not present

## 2023-08-01 DIAGNOSIS — H353130 Nonexudative age-related macular degeneration, bilateral, stage unspecified: Secondary | ICD-10-CM | POA: Insufficient documentation

## 2023-08-01 DIAGNOSIS — E889 Metabolic disorder, unspecified: Secondary | ICD-10-CM

## 2023-08-01 DIAGNOSIS — E782 Mixed hyperlipidemia: Secondary | ICD-10-CM

## 2023-08-01 DIAGNOSIS — M791 Myalgia, unspecified site: Secondary | ICD-10-CM

## 2023-08-01 DIAGNOSIS — E038 Other specified hypothyroidism: Secondary | ICD-10-CM

## 2023-08-01 DIAGNOSIS — H353131 Nonexudative age-related macular degeneration, bilateral, early dry stage: Secondary | ICD-10-CM | POA: Diagnosis not present

## 2023-08-01 DIAGNOSIS — R7989 Other specified abnormal findings of blood chemistry: Secondary | ICD-10-CM

## 2023-08-01 DIAGNOSIS — E639 Nutritional deficiency, unspecified: Secondary | ICD-10-CM | POA: Diagnosis not present

## 2023-08-01 DIAGNOSIS — R634 Abnormal weight loss: Secondary | ICD-10-CM | POA: Diagnosis not present

## 2023-08-01 NOTE — Progress Notes (Signed)
[name] is alone Sources of clinical information for visit is/are patient. Nursing assessment for this office visit was reviewed with the patient for accuracy and revision.     Previous Report(s) Reviewed: none     03/14/2023   10:03 AM  Depression screen PHQ 2/9  Decreased Interest 2  Down, Depressed, Hopeless 0  PHQ - 2 Score 2  Altered sleeping 1  Tired, decreased energy 2  Change in appetite 2  Feeling bad or failure about yourself  2  Trouble concentrating 2  Moving slowly or fidgety/restless 0  Suicidal thoughts 0  PHQ-9 Score 11   Flowsheet Row Office Visit from 03/14/2023 in [facility] Of [place]. [facility] Clinical Support from 02/26/2023 in [facility] [place]. [facility] Office Visit from 02/07/2023 in [facility] - [facility] Dept Of [place]. [facility]  Thoughts that you would be better off dead, or of hurting yourself in some way Not at all Not at all Nearly every day  PHQ-9 Total Score 11 6 20           03/14/2023   10:02 AM 02/26/2023   11:17 AM 02/07/2023   10:26 AM 01/03/2023   10:27 AM 10/25/2022    9:40 AM  Fall Risk   Falls in the past year? 0 0 0 0 0  Number falls in past yr: 0 0 0 0 0  Injury with Fall? 0 0 0 0 0  Follow up  Falls evaluation completed;Education provided;Falls prevention discussed          03/14/2023   10:03 AM 02/26/2023   11:22 AM 02/07/2023   10:25 AM  PHQ9 SCORE ONLY  PHQ-9 Total Score 11 6 20     There are no preventive care reminders to display for this patient.  Health Maintenance Due  Topic Date Due   Zoster Vaccines- Shingrix (1 of 2) 07/05/1995   INFLUENZA VACCINE  04/18/2023   COVID-19 Vaccine (7 - 2023-24 season) 05/19/2023      History/P.E. limitations: none  There are no preventive care reminders to display for this patient. There are no preventive care reminders to display for this patient.  Health Maintenance Due   Topic Date Due   Zoster Vaccines- Shingrix (1 of 2) 07/05/1995   INFLUENZA VACCINE  04/18/2023   COVID-19 Vaccine (7 - 2023-24 season) 05/19/2023     No chief complaint on file.    --------------------------------------------------------------------------------------------------------------------------------------------- Visit Problem List with A/P  No problem-specific Assessment & Plan notes found for this encounter.

## 2023-08-01 NOTE — Patient Instructions (Signed)
We are checking your Cholesterol, vitamins, kidneys, liver, potassium, and red blood cells.   Dr Jessee Newnam will let you know it they are abnormal.  If they are normal, he will send your a message through your MyChart

## 2023-08-02 ENCOUNTER — Encounter: Payer: Self-pay | Admitting: Family Medicine

## 2023-08-02 DIAGNOSIS — M79606 Pain in leg, unspecified: Secondary | ICD-10-CM | POA: Insufficient documentation

## 2023-08-02 NOTE — Assessment & Plan Note (Signed)
Wt Readings from Last 3 Encounters:  08/01/23 143 lb 4 oz (65 kg)  03/20/23 133 lb (60.3 kg)  03/14/23 138 lb (62.6 kg)   Weight much improved since hospitalization in last June.  Eating well, no increase in her usual diarrhea.  Plan is just monitoring for recurrence

## 2023-08-02 NOTE — Assessment & Plan Note (Signed)
New problem Onset: few weeks ago Location: lower lateral leg a little above heel to the outside of leg Quality: ache Severity: mild-mod Function: not impairing walking  Pattern: intermittent Course: stable  Radiation: no Relief: nothing tried Precipitant: no recalled injury Associated Symptoms:       Restricted ROM/stiffness/swelling:  no       Muscle ache/cramp/spasms: no       Color or temperature change: no increase warmth       Muscle strength change: no change       Change in sensation (dysesthesia/itch or numbness): none Trauma (Acute or Chronic): none Prior Diagnostic Testing or Treatments: none Relevant PMH/PSH: none  Assessment and Plan Lateral distal leg pain proximal to ankle - intermittent tenderness along achilles tendon vs tissue lateral to tendon.  - Working explanation is tendonopathy - topical Voltaren  - Shoes with wide soles to limit side to side ankle movement.

## 2023-08-02 NOTE — Assessment & Plan Note (Signed)
Established problem that has improved and has meet goal of normal range plts.  Appears to have responded to MVI daily.  Continue daily MVI.

## 2023-08-02 NOTE — Assessment & Plan Note (Signed)
Established problem Anemia was thought to have contributions from various vitamin deficiencies. She has been taking MVI daily since last office visit with Sarahy Creedon Lab Results  Component Value Date   WBC 5.0 08/01/2023   HGB 10.1 (L) 08/01/2023   HCT 30.9 (L) 08/01/2023   MCV 93 08/01/2023   PLT 345 08/01/2023   Hgb much improved with taking the MVI daily. She appears to be back around her baseline Hgb.  Monitor next office visit for recurrence of anemia

## 2023-08-02 NOTE — Assessment & Plan Note (Signed)
Established problem. Adequate blood pressure control.  No evidence of new end organ damage.  Tolerating medication without significant adverse effects.  Plan to continue current blood pressure medication regiment.   

## 2023-08-02 NOTE — Assessment & Plan Note (Signed)
Repeat TSH with FT3 and FT4 to follow up the persistent low TSH No new symptoms of hyperthyroidism now that weight loss has reversed.

## 2023-08-02 NOTE — Assessment & Plan Note (Signed)
Established problem Patient taking MVI daily Anemia improved.  Weight improved.  Checking deficiencies previously documents, B1, B6, and Vit E.

## 2023-08-02 NOTE — Assessment & Plan Note (Addendum)
Patient concerned about her cholesterol Statin intolerant Drug treatment would be primary prevention

## 2023-08-02 NOTE — Assessment & Plan Note (Signed)
Established problem. Stable.  No longer taking mirtazapine.

## 2023-08-02 NOTE — Assessment & Plan Note (Signed)
Established problem. Stable.  No signs of complications, medication side effects, or red flags. Checking Basic Metabolic Panel for serum creatinine changes and hypokalemia which has been a problem with patient in past for which she was started on Spirolactone.  Her spironolactone was stopped during June hospitalization.

## 2023-08-08 LAB — CMP14+EGFR
ALT: 18 [IU]/L (ref 0–32)
AST: 28 IU/L (ref 0–40)
Albumin: 3.6 g/dL — ABNORMAL LOW (ref 3.8–4.8)
Alkaline Phosphatase: 101 [IU]/L (ref 44–121)
BUN/Creatinine Ratio: 13 (ref 12–28)
BUN: 15 mg/dL (ref 8–27)
Bilirubin Total: 0.4 mg/dL (ref 0.0–1.2)
CO2: 16 mmol/L — ABNORMAL LOW (ref 20–29)
Calcium: 9 mg/dL (ref 8.7–10.3)
Chloride: 108 mmol/L — ABNORMAL HIGH (ref 96–106)
Creatinine, Ser: 1.13 mg/dL — ABNORMAL HIGH (ref 0.57–1.00)
Globulin, Total: 2.9 g/dL (ref 1.5–4.5)
Glucose: 76 mg/dL (ref 70–99)
Potassium: 3.1 mmol/L — ABNORMAL LOW (ref 3.5–5.2)
Sodium: 142 mmol/L (ref 134–144)
Total Protein: 6.5 g/dL (ref 6.0–8.5)
eGFR: 50 mL/min/{1.73_m2} — ABNORMAL LOW (ref >59)

## 2023-08-08 LAB — CBC WITH DIFFERENTIAL/PLATELET
Basophils Absolute: 0 10*3/uL (ref 0.0–0.2)
Basos: 1 %
EOS (ABSOLUTE): 0.2 10*3/uL (ref 0.0–0.4)
Eos: 4 %
Hematocrit: 30.9 % — ABNORMAL LOW (ref 34.0–46.6)
Hemoglobin: 10.1 g/dL — ABNORMAL LOW (ref 11.1–15.9)
Immature Grans (Abs): 0 10*3/uL (ref 0.0–0.1)
Immature Granulocytes: 0 %
Lymphocytes Absolute: 1.6 10*3/uL (ref 0.7–3.1)
Lymphs: 33 %
MCH: 30.5 pg (ref 26.6–33.0)
MCHC: 32.7 g/dL (ref 31.5–35.7)
MCV: 93 fL (ref 79–97)
Monocytes Absolute: 0.5 10*3/uL (ref 0.1–0.9)
Monocytes: 11 %
Neutrophils Absolute: 2.6 10*3/uL (ref 1.4–7.0)
Neutrophils: 51 %
Platelets: 345 10*3/uL (ref 150–450)
RBC: 3.31 x10E6/uL — ABNORMAL LOW (ref 3.77–5.28)
RDW: 13 % (ref 11.7–15.4)
WBC: 5 10*3/uL (ref 3.4–10.8)

## 2023-08-08 LAB — BRAIN NATRIURETIC PEPTIDE: BNP: 47.3 pg/mL (ref 0.0–100.0)

## 2023-08-08 LAB — LIPID PANEL
Chol/HDL Ratio: 2.9 ratio (ref 0.0–4.4)
Cholesterol, Total: 218 mg/dL — ABNORMAL HIGH (ref 100–199)
HDL: 75 mg/dL (ref >39)
LDL Chol Calc (NIH): 132 mg/dL — ABNORMAL HIGH (ref 0–99)
Triglycerides: 62 mg/dL (ref 0–149)
VLDL Cholesterol Cal: 11 mg/dL (ref 5–40)

## 2023-08-08 LAB — TSH+FREE T4
Free T4: 1.13 ng/dL (ref 0.82–1.77)
TSH: 0.404 u[IU]/mL — ABNORMAL LOW (ref 0.450–4.500)

## 2023-08-08 LAB — VITAMIN E
Vitamin E (Alpha Tocopherol): 15.4 mg/L (ref 9.0–29.0)
Vitamin E(Gamma Tocopherol): 2.4 mg/L (ref 0.5–4.9)

## 2023-08-08 LAB — VITAMIN B1: Thiamine: 123.2 nmol/L (ref 66.5–200.0)

## 2023-08-08 LAB — T3, FREE: T3, Free: 2.5 pg/mL (ref 2.0–4.4)

## 2023-08-08 LAB — VITAMIN B6: Vitamin B6: 47.7 ug/L (ref 3.4–65.2)

## 2023-08-12 ENCOUNTER — Telehealth: Payer: Self-pay | Admitting: Family Medicine

## 2023-08-12 ENCOUNTER — Telehealth: Payer: Self-pay | Admitting: Internal Medicine

## 2023-08-12 DIAGNOSIS — E876 Hypokalemia: Secondary | ICD-10-CM

## 2023-08-12 NOTE — Telephone Encounter (Signed)
I discussed lab results with [name]. Only significant recommendation is for her to increase her potassium supple from 60 mEq to 80 mEq daily.

## 2023-08-12 NOTE — Telephone Encounter (Signed)
Pt c/o medication issue:  1. Name of Medication: Repatha  2. How are you currently taking this medication (dosage and times per day)?   3. Are you having a reaction (difficulty breathing--STAT)?   4. What is your medication issue? Patient says she is in the donut hole, and she can not afford the Repatha at this time. She wants to find out from Dr Izora Ribas if she does not take the Repatha until after the first of the year, will she be alright?

## 2023-08-13 NOTE — Telephone Encounter (Signed)
[facility] and gave [facility] the [facility] the [employer]. They received a paid claim.  Called the patient to inform her that the [facility] grant was renewed and she can pick up her medication with no charge.

## 2023-08-13 NOTE — Telephone Encounter (Signed)
[employer] grant renewed  Card No. [id number]  BIN [id number]  PCN [id number]  PC Group [id number]

## 2023-10-10 ENCOUNTER — Telehealth: Payer: Self-pay

## 2023-10-10 DIAGNOSIS — E876 Hypokalemia: Secondary | ICD-10-CM

## 2023-10-10 MED ORDER — POTASSIUM CHLORIDE CRYS ER 20 MEQ PO TBCR
40.0000 meq | EXTENDED_RELEASE_TABLET | Freq: Two times a day (BID) | ORAL | 3 refills | Status: AC
Start: 2023-10-10 — End: 2024-10-04

## 2023-10-10 NOTE — Telephone Encounter (Signed)
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

## 2023-10-10 NOTE — Telephone Encounter (Signed)
Patient calls nurse line requesting a refill on Potassium prescription.   She reports she has been taking (2) in the AM and (2) in the PM. However, when she went to refill the prescription they told her it was too soon to fill.   It appears the prescription has two sets of directions.   Please send in new prescription reflecting (2) in AM and (2) in PM.  Or if patient is taking (2) in AM and (1) in PM.   Please advise.

## 2023-10-24 ENCOUNTER — Ambulatory Visit (INDEPENDENT_AMBULATORY_CARE_PROVIDER_SITE_OTHER): Payer: Medicare Other | Admitting: Family Medicine

## 2023-10-24 ENCOUNTER — Encounter: Payer: Self-pay | Admitting: Family Medicine

## 2023-10-24 VITALS — BP 120/70 | HR 70 | Ht 61.0 in | Wt 143.1 lb

## 2023-10-24 DIAGNOSIS — M791 Myalgia, unspecified site: Secondary | ICD-10-CM | POA: Diagnosis not present

## 2023-10-24 DIAGNOSIS — M25511 Pain in right shoulder: Secondary | ICD-10-CM

## 2023-10-24 NOTE — Patient Instructions (Addendum)
 Please go to [facility] at [address] to have an X-ray of your collar bone joint.  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   You can also apply Solonpas lidocaine  patch (Over-the-Counter) if pain not controlled with Voltaren Gel.

## 2023-10-24 NOTE — Progress Notes (Signed)
 [name] is alone Sources of clinical information for visit is/are patient. Nursing assessment for this office visit was reviewed with the patient for accuracy and revision.     Previous Report(s) Reviewed: EGD report 02/01/23. Cervical spine MRI report in 2009, Coronary CT calcium  score report, Nuc Myoview  report.      08/01/2023   10:05 AM  Depression screen PHQ 2/9  Decreased Interest 2  Down, Depressed, Hopeless 0  PHQ - 2 Score 2  Altered sleeping 2  Tired, decreased energy 2  Change in appetite 2  Feeling bad or failure about yourself  0  Trouble concentrating 2  Moving slowly or fidgety/restless 0  Suicidal thoughts 0  PHQ-9 Score 10  Difficult doing work/chores Not difficult at all   [employer] Office Visit from 08/01/2023 in [facility] - [facility] [place]. [facility] Office Visit from 03/14/2023 in [facility] - [facility] Dept Of [place]. [facility] Clinical Support from 02/26/2023 in [facility] - A Dept Of [place]. [facility]  Thoughts that you would be better off dead, or of hurting yourself in some way Not at all Not at all Not at all  PHQ-9 Total Score 10 11 6           10/24/2023    1:58 PM 08/01/2023   10:05 AM 03/14/2023   10:02 AM 02/26/2023   11:17 AM 02/07/2023   10:26 AM  Fall Risk   Falls in the past year? 0 0 0 0 0  Number falls in past yr: 0 0 0 0 0  Injury with Fall? 0 0 0 0 0  Follow up    Falls evaluation completed;Education provided;Falls prevention discussed        08/01/2023   10:05 AM 03/14/2023   10:03 AM 02/26/2023   11:22 AM  PHQ9 SCORE ONLY  PHQ-9 Total Score 10 11 6     There are no preventive care reminders to display for this patient.  Health Maintenance Due  Topic Date Due   Zoster Vaccines- Shingrix (1 of 2) 07/05/1995   COVID-19 Vaccine (7 - 2024-25 season) 05/19/2023      History/P.E. limitations: mild memory impairment  There are no preventive  care reminders to display for this patient. There are no preventive care reminders to display for this patient.  Health Maintenance Due  Topic Date Due   Zoster Vaccines- Shingrix (1 of 2) 07/05/1995   COVID-19 Vaccine (7 - 2024-25 season) 05/19/2023     Chief Complaint  Patient presents with   Neck Pain     --------------------------------------------------------------------------------------------------------------------------------------------- Visit Problem List with A/P  No problem-specific Assessment & Plan notes found for this encounter.

## 2023-10-25 ENCOUNTER — Encounter: Payer: Self-pay | Admitting: Family Medicine

## 2023-10-25 DIAGNOSIS — M25511 Pain in right shoulder: Secondary | ICD-10-CM | POA: Insufficient documentation

## 2023-10-25 NOTE — Assessment & Plan Note (Addendum)
 New complaint - right lower neck to right upper chest pain - aroudn right [place] joint. Onset: couple months ago Location: right of sternal notch Quality: sharp Severity: moderate Function: does not interfere with function other than cuasing anxiety Duration: minutes Pattern: intermittent Course: stable Radiation: lower right anterior neck and upper right sternum Relief: none Precipitant: tends to occur more often soon after eating        Trauma (Acute or Chronic): none recalled Prior Diagnostic Testing or Treatments:  Cervical Spine MRI 2009 1. Degenerative cervical spondylosis with degenerative disc disease and degenerative facet disease. There are broad based bulging degenerated discs along with disc protrusions and osteophytic and uncinate spurring contributing to spinal and foraminal stenosis as specifically described above.   2. No acute bony findings and normal MR appearance of the cervical spinal cord.   Relevant PMH/PSH:  MYOCARDIAL PERFUSION IMAGING 03/22/2020  Narrative  The left ventricular ejection fraction is hyperdynamic (>65%).  Nuclear stress EF: 76%.  T wave inversion was noted during stress in the II, III, aVF, V3, V4, V5 and V6 leads.  The study is normal.  This is a low risk study.  ST-T nonspecific changes again seen with lexiscan  infusion, similar to prior. On this scan, there is no evidence of perfusion defect at rest or with stress. Low risk study.   Cardiac calcium  score 03/19/2022 IMPRESSION: 1. Coronary calcium  score of 296. This was 84th percentile for age-, race-, and sex-matched controls.  Physical Exam HEENT: prominent bony, right sternoclavicular joint. Non-TTP.  No tenderness nor masses palpation neck and sternum/thorax adjacent to R. [place] joint.  No tracheal deviation, no thyroid  enlargement.    A/ Working explanation is the only physical abnormality, the prominent, bony right Sternoclavicular joint which appears to be the center of the general  area that Ms Bettcher indicates as painful. 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 P/ Right clavicular Xray with serendipity view of R. [place] joint.   Topical diclofenac gel (OTC) lidocaine  patches pen RTC 3 weeks to reasses

## 2023-11-07 ENCOUNTER — Ambulatory Visit: Payer: Medicare Other

## 2023-12-03 ENCOUNTER — Encounter: Payer: Self-pay | Admitting: Gastroenterology

## 2023-12-03 ENCOUNTER — Ambulatory Visit: Payer: Medicare Other | Admitting: Gastroenterology

## 2023-12-03 VITALS — BP 110/70 | HR 71 | Ht 61.0 in | Wt 143.0 lb

## 2023-12-03 DIAGNOSIS — K52831 Collagenous colitis: Secondary | ICD-10-CM

## 2023-12-03 DIAGNOSIS — K219 Gastro-esophageal reflux disease without esophagitis: Secondary | ICD-10-CM

## 2023-12-03 DIAGNOSIS — M25511 Pain in right shoulder: Secondary | ICD-10-CM

## 2023-12-03 MED ORDER — ESOMEPRAZOLE MAGNESIUM 20 MG PO CPDR: 20.0000 mg | DELAYED_RELEASE_CAPSULE | Freq: Every day | ORAL | 1 refills | Status: DC

## 2023-12-03 NOTE — Patient Instructions (Addendum)
 Please purchase the following medications over the counter and take as directed:  Voltaren (diclofenac) gel - apply as needed to chest wall.  We have sent the following medications to your pharmacy for you to pick up at your convenience: Nexium 20 mg: Take daily as needed (try for 2 weeks)  Please follow up as needed.  If you would like you can get the chest Xray that Dr. McDiarmid has orders.  Thank you for entrusting me with your care and for choosing [facility], Dr. Ileene Patrick    If your blood pressure at your visit was 140/90 or greater, please contact your primary care physician to follow up on this. ______________________________________________________  If you are age 79 or older, your body mass index should be between 23-30. Your Body mass index is 27.02 kg/m. If this is out of the aforementioned range listed, please consider follow up with your Primary Care Provider.  If you are age 79 or younger, your body mass index should be between 19-25. Your Body mass index is 27.02 kg/m. If this is out of the aformentioned range listed, please consider follow up with your Primary Care Provider.  ________________________________________________________  The [facility] providers would like to encourage you to use [facility] to communicate with providers for non-urgent requests or questions.  Due to long hold times on the telephone, sending your provider a message by [facility] may be a faster and more efficient way to get a response.  Please allow 48 business hours for a response.  Please remember that this is for non-urgent requests.  _______________________________________________________  Due to recent changes in healthcare laws, you may see the results of your imaging and laboratory studies on MyChart before your provider has had a chance to review them.  We understand that in some cases there may be results that are confusing or concerning to you. Not all laboratory results  come back in the same time frame and the provider may be waiting for multiple results in order to interpret others.  Please give [place] 48 hours in order for your provider to thoroughly review all the results before contacting the office for clarification of your results.
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
## 2023-12-04 ENCOUNTER — Telehealth: Payer: Self-pay | Admitting: Internal Medicine

## 2023-12-04 DIAGNOSIS — R002 Palpitations: Secondary | ICD-10-CM

## 2023-12-04 DIAGNOSIS — I493 Ventricular premature depolarization: Secondary | ICD-10-CM

## 2023-12-04 NOTE — Telephone Encounter (Signed)
 Patient c/o Palpitations:  STAT if patient reporting lightheadedness, shortness of breath, or chest pain  How long have you had palpitations/irregular HR/ Afib? Are you having the symptoms now?   Yes  Are you currently experiencing lightheadedness, SOB or CP?   No  Do you have a history of afib (atrial fibrillation) or irregular heart rhythm?   Yes  Have you checked your BP or HR? (document readings if available):  No  Are you experiencing any other symptoms?   Nausea  Patient stated she is concerned about her PVC's and wants a call back to advise on next steps.

## 2023-12-04 NOTE — Telephone Encounter (Addendum)
 Called patient back about message. Patient complaining of palpitations and irregular heart rate that keeps her up at night. Patient is currently taking metoprolol succinate 50 mg in the AM and 25 mg in the PM. Patient's current vital signs,  HR 68 BP 126/66. Patient stated she did have some nausea on Monday that lasted about 20 minutes, but she has not felt any more nausea since. Patient has been under a lot of stress with her husband in the hospital at [facility] and she lives in [place]. Patient has not been able to get much rest in. Encouraged patient to reduce her caffeine in-take, try to get more sleep, and find a way to relieve stress like walking, meditating, praying, etc. Will send message to Dr. Izora Ribas for further advisement.

## 2023-12-06 ENCOUNTER — Ambulatory Visit: Attending: Internal Medicine

## 2023-12-06 DIAGNOSIS — I493 Ventricular premature depolarization: Secondary | ICD-10-CM

## 2023-12-06 DIAGNOSIS — R002 Palpitations: Secondary | ICD-10-CM

## 2023-12-06 NOTE — Telephone Encounter (Signed)
 Called pt advised of MD response: She has a history of P-SVT.   Historically she has felt more fatigue when she we have increased BB dose. Given that she has a stress trigger, a portion of this could be related to her stress. I can offer her heart monitor; if + for higher burden we could increase dose and then consider AAD   Advised pt that heart monitor will come in the mail in 3-5 business days.  Pt reports is supposed to have a CXR at some point.  Advised to wear monitor after.  Pt reports notes increased SOB.  Is out of breath just walking down the hall of apartment building to mailbox.  Has to stop during cleaning to catch breath.  Reports small amount of swelling to lower legs.  Scheduled an OV for 12/26/23 advised if symptoms worsen to call back in.

## 2023-12-06 NOTE — Telephone Encounter (Signed)
 Pt wanting to talk to nurse

## 2023-12-06 NOTE — Progress Notes (Unsigned)
 Enrolled patient for a 14 day Zio XT  monitor to be mailed to patients home

## 2023-12-12 DIAGNOSIS — H1045 Other chronic allergic conjunctivitis: Secondary | ICD-10-CM | POA: Diagnosis not present

## 2023-12-12 DIAGNOSIS — H04123 Dry eye syndrome of bilateral lacrimal glands: Secondary | ICD-10-CM | POA: Diagnosis not present

## 2023-12-12 DIAGNOSIS — Z961 Presence of intraocular lens: Secondary | ICD-10-CM | POA: Diagnosis not present

## 2023-12-12 DIAGNOSIS — H353131 Nonexudative age-related macular degeneration, bilateral, early dry stage: Secondary | ICD-10-CM | POA: Diagnosis not present

## 2023-12-26 ENCOUNTER — Ambulatory Visit: Admitting: Internal Medicine

## 2023-12-28 ENCOUNTER — Other Ambulatory Visit: Payer: Self-pay | Admitting: Gastroenterology

## 2024-01-07 DIAGNOSIS — R002 Palpitations: Secondary | ICD-10-CM | POA: Diagnosis not present

## 2024-01-07 DIAGNOSIS — I493 Ventricular premature depolarization: Secondary | ICD-10-CM | POA: Diagnosis not present

## 2024-01-08 ENCOUNTER — Encounter: Payer: Self-pay | Admitting: Internal Medicine

## 2024-01-08 DIAGNOSIS — I493 Ventricular premature depolarization: Secondary | ICD-10-CM | POA: Diagnosis not present

## 2024-01-08 DIAGNOSIS — R002 Palpitations: Secondary | ICD-10-CM | POA: Diagnosis not present

## 2024-01-09 ENCOUNTER — Telehealth: Payer: Self-pay | Admitting: Internal Medicine

## 2024-01-09 ENCOUNTER — Encounter: Payer: Self-pay | Admitting: Family Medicine

## 2024-01-09 NOTE — Telephone Encounter (Signed)
 Pt c/o medication issue:  1. Name of Medication:   Evolocumab  (REPATHA  SURECLICK) 140 MG/ML SOAJ   2. How are you currently taking this medication (dosage and times per day)?   3. Are you having a reaction (difficulty breathing--STAT)?   4. What is your medication issue?   Caller [employer]) stated patient had a defective injector and wants a call back to confirm the replacement can be sent to patient.  Ref# [id number].

## 2024-01-10 ENCOUNTER — Other Ambulatory Visit (HOSPITAL_COMMUNITY): Payer: Self-pay

## 2024-01-10 ENCOUNTER — Telehealth: Payer: Self-pay | Admitting: Internal Medicine

## 2024-01-10 MED ORDER — METOPROLOL SUCCINATE ER 50 MG PO TB24: 50.0000 mg | ORAL_TABLET | Freq: Two times a day (BID) | ORAL | 3 refills | Status: AC

## 2024-01-10 MED ORDER — METOPROLOL SUCCINATE ER 50 MG PO TB24
50.0000 mg | ORAL_TABLET | Freq: Two times a day (BID) | ORAL | 3 refills | Status: DC
  Filled 2024-01-10: qty 180, 90d supply, fill #0

## 2024-01-10 NOTE — Telephone Encounter (Signed)
-----   Message from Jann Melody sent at 01/08/2024 10:38 AM EDT ----- Results: Frequent PVCs, potentially with symptoms Plan: Increase metoprolol  to BID (50 mg)  Jann Melody, MD

## 2024-01-10 NOTE — Telephone Encounter (Signed)
 Patient was returning call for results. Please advise

## 2024-01-10 NOTE — Telephone Encounter (Signed)
 Verbal given for replacement

## 2024-01-10 NOTE — Telephone Encounter (Signed)
 The patient has been notified of the result and verbalized understanding.  All questions (if any) were answered. Christine Cozier, RN 01/10/2024 12:59 PM   Called pt pharmacy gave verbal order for metoprolol  succinate 50 mg PO BID.

## 2024-02-24 ENCOUNTER — Other Ambulatory Visit: Payer: Self-pay | Admitting: Family Medicine

## 2024-02-24 DIAGNOSIS — Z1231 Encounter for screening mammogram for malignant neoplasm of breast: Secondary | ICD-10-CM

## 2024-03-14 ENCOUNTER — Ambulatory Visit
Admission: EM | Admit: 2024-03-14 | Discharge: 2024-03-14 | Disposition: A | Discharge status: Home / Self Care | Attending: Physician Assistant | Admitting: Physician Assistant

## 2024-03-14 ENCOUNTER — Ambulatory Visit (INDEPENDENT_AMBULATORY_CARE_PROVIDER_SITE_OTHER)

## 2024-03-14 DIAGNOSIS — R051 Acute cough: Secondary | ICD-10-CM

## 2024-03-14 DIAGNOSIS — J069 Acute upper respiratory infection, unspecified: Secondary | ICD-10-CM

## 2024-03-14 DIAGNOSIS — J029 Acute pharyngitis, unspecified: Secondary | ICD-10-CM | POA: Diagnosis not present

## 2024-03-14 LAB — POC SARS CORONAVIRUS 2 AG -  ED: SARS Coronavirus 2 Ag: NEGATIVE

## 2024-03-14 MED ORDER — FLUTICASONE PROPIONATE 50 MCG/ACT NA SUSP: 1.0000 | Freq: Every day | NASAL | 0 refills | Status: AC

## 2024-03-14 MED ORDER — BENZONATATE 100 MG PO CAPS
100.0000 mg | ORAL_CAPSULE | Freq: Three times a day (TID) | ORAL | 0 refills | Status: AC
Start: 2024-03-14 — End: ?

## 2024-03-14 NOTE — Discharge Instructions (Signed)
 You are negative for COVID.  Your chest x-ray was normal with no evidence of pneumonia.  I suspect you have a virus that you got from your daughter.  This will probably take 5 to 7 days to go away.  Use over-the-counter medications such as Mucinex, Tylenol , nasal saline and sinus rinses for symptom relief.  I have called in Tessalon  to help with your cough.  You can take this up to 3 times a day.  Use fluticasone nasal spray to help with your congestion.  I also recommend gargling with warm salt water.  If you are not feeling better within a week please return here or see your primary care.  If at any point anything worsens and you have high fever, worsening cough, chest pain, shortness of breath, nausea/vomiting you need to be seen immediately.
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
## 2024-03-14 NOTE — ED Triage Notes (Signed)
 Patient to Urgent Care with complaints of a sore throat/ headaches. Temp this morning 98.2.   Symptoms started yesterday. Reports close contact w/ her daughter with the same symptoms.   Meds: zinc / cough drops.

## 2024-03-30 ENCOUNTER — Ambulatory Visit: Admitting: Internal Medicine

## 2024-04-03 ENCOUNTER — Telehealth: Payer: Self-pay | Admitting: Pharmacist

## 2024-04-03 DIAGNOSIS — I700 Atherosclerosis of aorta: Secondary | ICD-10-CM

## 2024-04-03 DIAGNOSIS — I2510 Atherosclerotic heart disease of native coronary artery without angina pectoris: Secondary | ICD-10-CM

## 2024-04-03 DIAGNOSIS — E785 Hyperlipidemia, unspecified: Secondary | ICD-10-CM

## 2024-04-03 MED ORDER — REPATHA SURECLICK 140 MG/ML ~~LOC~~ SOAJ: 140.0000 mg | SUBCUTANEOUS | 0 refills | Status: DC

## 2024-04-03 NOTE — Telephone Encounter (Signed)
 Patient called to report she used her last Repatha  injection. Had reported a damaged pen in April and requested replacement which was authorized. She reports she never received the replacement pen and now no longer has enough medication to make it to next refill. Will give patient one sample which she will pick up the week of 7/21. Med in fridge.

## 2024-04-09 ENCOUNTER — Other Ambulatory Visit (HOSPITAL_COMMUNITY): Payer: Self-pay

## 2024-04-13 ENCOUNTER — Ambulatory Visit: Payer: Medicare Other

## 2024-04-13 VITALS — Ht 61.0 in | Wt 142.8 lb

## 2024-04-13 DIAGNOSIS — Z0000 Encounter for general adult medical examination without abnormal findings: Secondary | ICD-10-CM
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
## 2024-04-13 NOTE — Patient Instructions (Signed)
 [name] , Thank you for taking time out of your busy schedule to complete your Annual Wellness Visit with me. I enjoyed our conversation and look forward to speaking with you again next year. I, as well as your care team,  appreciate your ongoing commitment to your health goals. Please review the following plan we discussed and let me know if I can assist you in the future. Your Game plan/ To Do List    Referrals: If you haven't heard from the office you've been referred to, please reach out to them at the phone provided.   Follow up Visits: Next Medicare AWV with our clinical staff: 04/15/2025 at 9:50 a.m. phone visit with Nurse   Have you seen your provider in the last 6 months (3 months if uncontrolled diabetes)? Yes Next Office Visit with your provider: Office wll call to schedule  Clinician Recommendations:  Aim for 30 minutes of exercise or brisk walking, 6-8 glasses of water, and 5 servings of fruits and vegetables each day.       This is a list of the screening recommended for you and due dates:  Health Maintenance  Topic Date Due   COVID-19 Vaccine (7 - 2024-25 season) 05/19/2023   Flu Shot  04/17/2024   Zoster (Shingles) Vaccine (2 of 2) 04/25/2024   Medicare Annual Wellness Visit  04/13/2025   DTaP/Tdap/Td vaccine (3 - Td or Tdap) 09/08/2030   Pneumococcal Vaccine for age over 35  Completed   DEXA scan (bone density measurement)  Completed   Hepatitis C Screening  Completed   Hepatitis B Vaccine  Aged Out   HPV Vaccine  Aged Out   Meningitis B Vaccine  Aged Out    Advanced directives: (In Chart) A copy of your advanced directives are scanned into your chart should your provider ever need it. Advance Care Planning is important because it:  [x]  Makes sure you receive the medical care that is consistent with your values, goals, and preferences  [x]  It provides guidance to your family and loved ones and reduces their decisional burden about whether or not they are making the  right decisions based on your wishes.  Follow the link provided in your after visit summary or read over the paperwork we have mailed to you to help you started getting your Advance Directives in place. If you need assistance in completing these, please reach out to us  so that we can help you!  See attachments for Preventive Care and Fall Prevention Tips.

## 2024-04-17 ENCOUNTER — Ambulatory Visit

## 2024-04-20 ENCOUNTER — Telehealth: Payer: Self-pay

## 2024-04-20 NOTE — Telephone Encounter (Signed)
 I spoke with Ms [name] about her dizziness.  Acute onset today. Both occurred while turning in bed and with standing up.  Some of the events accompanied by nausea.  Some events feel like she will pass out. No falls  Patient has history of BPPV.  Will scheudle for evaluation tomorrow to clarify if these divergent symptoms.

## 2024-04-20 NOTE — Telephone Encounter (Signed)
 Spoke with patient made ATC appt for tomorrow at 10:10 for dizziness. Nelson Land, CMA

## 2024-04-20 NOTE — Telephone Encounter (Signed)
-----   Message from [facility] McDiarmid sent at 04/20/2024  1:56 PM EDT ----- Regarding: Needs appointment scheduled Please schedule Ms [name] on acute work-in schedule for tomorrow to evaluate her dizziness.

## 2024-04-20 NOTE — Telephone Encounter (Signed)
 Patient calls nurse line requesting a refill on vertigo medication.   She reports symptoms started Last Sunday with dizziness with position changes and the feeling of feeling drunk.   She denies any vomiting, however reports slight nausea.   She is requesting a refill on the medication she had years ago.   Advised will forward to PCP for advisement.

## 2024-04-21 ENCOUNTER — Ambulatory Visit (INDEPENDENT_AMBULATORY_CARE_PROVIDER_SITE_OTHER): Admitting: Family Medicine

## 2024-04-21 VITALS — BP 132/64 | HR 96 | Ht 61.0 in | Wt 144.8 lb

## 2024-04-21 DIAGNOSIS — R42 Dizziness and giddiness: Secondary | ICD-10-CM | POA: Diagnosis not present

## 2024-04-21 NOTE — Patient Instructions (Signed)
 It was wonderful to see you today.  Please bring ALL of your medications with you to every visit.   Today we talked about:  Dizziness - I believe this is most likely because of your vertigo. This is caused by the crystals in your ear that help with balance being affected. This could have been because of your recent viral respiratory infection. Since it is getting better, we will continue to monitor and not do any medication. Medications like meclizine can sometimes make people your age feel more dizzy so we will avoid those for now. As you feel better, we can hold off on labs and see if you want to follow up on your hemoglobin or other labs at your follow up with Dr. Keneth on 8/28.   Please let us  know if your symptoms worsen.   Thank you for choosing [facility].   Please call [phone] with any questions about today's appointment.  Areta Saliva, MD  Family Medicine

## 2024-04-21 NOTE — Progress Notes (Signed)
    SUBJECTIVE:   CHIEF COMPLAINT / HPI:   Dizziness   Last Wednesday started having episodes of dizziness. Happens whenever she moves her head. Like if she is sitting and moves her head or rolls over in bed. Does not get dizziness after going to the bathroom. Only had one episode of feeling palpitations after drinking some coffee. No persistent palpitations. No darkening of her vision with episodes. Dizziness lasts a few seconds sometimes with accompanying nausea. She says it is starting to improve since last Wednesday. She took some over the counter meclizine a couple days ago and said it did not help much.  Nausea has improved.  Patient denies palpitations or dizziness at this time in the office.  PERTINENT  PMH / PSH: Pvcs, BPPH,   OBJECTIVE:   BP 132/64   Pulse 96   Ht 5' 1 (1.549 m)   Wt 144 lb 12.8 oz (65.7 kg)   SpO2 100%   BMI 27.36 kg/m   General: well appearing, in no acute distress CV: RRR, radial pulses equal and palpable, cap refill less than 2 seconds, no BLE edema  Resp: Normal work of breathing on room air, CTAB Abd: Soft, non tender, non distended  Neuro: Alert & Oriented x 4, PERRLA, EOMI, CN II through XII grossly intact, sensation intact throughout, upper extremity strength 5 out of 5, lower extremity strength 5 out of 5 Rapid alternating movements normal, no truncal ataxia  ASSESSMENT/PLAN:   Assessment & Plan Dizziness Patient's history is mostly consistent with BPPH.  This could have been incited due to recent URI.  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  Patient does have history of anemia and remote history of vitamin deficiencies.  However she declines laboratory testing today as her symptoms of been improving. - Offered patient vestibular rehab referral however patient declined at this time due to financial concerns. - Discussed meclizine with patient  and recommended that patient avoid due to concern for falls and worsening dizziness as it is on the beers list.  Patient agrees and will not take it. - Follow-up scheduled with Dr. McDiarmid (PCP) on August 28     Areta Saliva, MD [facility]

## 2024-04-21 NOTE — Assessment & Plan Note (Signed)
 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  This could have been incited due to recent URI.  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  Patient does have history of anemia and remote history of vitamin deficiencies.  However she declines laboratory testing today as her symptoms of been improving. - Offered patient vestibular rehab referral however patient declined at this time due to financial concerns. - Discussed meclizine with patient and recommended that patient avoid due to concern for falls and worsening dizziness as it is on the beers list.  Patient agrees and will not take it. - Follow-up scheduled with Dr. McDiarmid (PCP) on August 28

## 2024-04-27 ENCOUNTER — Telehealth: Payer: Self-pay | Admitting: Internal Medicine

## 2024-04-27 DIAGNOSIS — I700 Atherosclerosis of aorta: Secondary | ICD-10-CM

## 2024-04-27 DIAGNOSIS — E782 Mixed hyperlipidemia: Secondary | ICD-10-CM

## 2024-04-27 NOTE — Telephone Encounter (Signed)
*  STAT* If patient is at the pharmacy, call can be transferred to refill team.   1. Which medications need to be refilled? (please list name of each medication and dose if known)   Evolocumab  (REPATHA  SURECLICK) 140 MG/ML SOAJ     2. Would you like to learn more about the convenience, safety, & potential cost savings by using the [facility]? No      3. Are you open to using the [facility] (Type [facility]. ). No   4. Which pharmacy/location (including street and city if local pharmacy) is medication to be sent to? [id number] [address], [place] [phone]     5. Do they need a 30 day or 90 day supply? 30 day

## 2024-04-28 ENCOUNTER — Telehealth: Payer: Self-pay | Admitting: Internal Medicine

## 2024-04-28 DIAGNOSIS — I700 Atherosclerosis of aorta: Secondary | ICD-10-CM

## 2024-04-28 DIAGNOSIS — E782 Mixed hyperlipidemia: Secondary | ICD-10-CM

## 2024-04-28 MED ORDER — REPATHA SURECLICK 140 MG/ML ~~LOC~~ SOAJ: 1.0000 mL | SUBCUTANEOUS | 1 refills | Status: DC

## 2024-04-28 NOTE — Telephone Encounter (Signed)
 Caller [facility]) stated the will need to confirm patient's replacement for Repatha  can be sent.  Case# [id number]

## 2024-04-29 MED ORDER — REPATHA SURECLICK 140 MG/ML ~~LOC~~ SOAJ: 1.0000 mL | SUBCUTANEOUS | 0 refills | Status: DC

## 2024-04-29 NOTE — Telephone Encounter (Signed)
 Rx with case number attached sent to [facility]

## 2024-05-01 ENCOUNTER — Ambulatory Visit
Admission: RE | Admit: 2024-05-01 | Discharge: 2024-05-01 | Disposition: A | Discharge status: Home / Self Care | Source: Ambulatory Visit | Attending: Family Medicine | Admitting: Family Medicine

## 2024-05-01 DIAGNOSIS — Z1231 Encounter for screening mammogram for malignant neoplasm of breast: Secondary | ICD-10-CM

## 2024-05-14 ENCOUNTER — Ambulatory Visit: Admitting: Family Medicine

## 2024-06-16 NOTE — Progress Notes (Deleted)
 Cardiology Office Note:  .    Date:  06/16/2024  ID:  [name], DOB 04/10/1946, MRN [id number] PCP: McDiarmid, Krystal BIRCH, MD  [facility] Providers Cardiologist:  Victory LELON Claudene DOUGLAS, MD (Inactive) { Click to update primary MD,subspecialty MD or APP then REFRESH:1}    CC: *** Consulted for the evaluation of *** at the behest of ***   History of Present Illness: .    [name] is a 78 y.o. female HTN, HLD and CAC, and aortic atherosclerosis complicated by statin myalgia, and PVCs.   Patient notes that she is doing better.   Since last visit notes that she was keeping her granddaughter, but hasn't since she had May hospitalization for dehydration.  Now feels like herself There are no interval hospital/ED visit.     Rare resting chest pressures ~ once a year.  No exertional symptoms.  No SOB/DOE and no PND/Orthopnea.  No weight gain or leg swelling. Rare palpitations that improves with metoprolol . Has had poor appetite     Relevant histories: .  Social: [name] recommended me to her.   2023: Sees lipid clinic did not have Repatha  related rash, has financial issues with getting PCKS9i.  [name] Increase her metoprolol  for palpitations.  Discussed the use of AI scribe software for clinical note transcription with the patient, who gave verbal consent to proceed.   Relevant histories: .  Social *** ROS: As per HPI.   Studies Reviewed: .     Cardiac Studies & Procedures   ______________________________________________________________________________________________   STRESS TESTS  MYOCARDIAL PERFUSION IMAGING 03/22/2020  Interpretation Summary  The left ventricular ejection fraction is hyperdynamic (>65%).  Nuclear stress EF: 76%.  T wave inversion was noted during stress in the II, III, aVF, V3, V4, V5 and V6 leads.  The study is normal.  This is a low risk study.  ST-T nonspecific changes again seen with lexiscan  infusion, similar to prior. On this  scan, there is no evidence of perfusion defect at rest or with stress. Low risk study.      MONITORS  LONG TERM MONITOR (3-14 DAYS) 01/07/2024  Narrative   Patient had a minimum heart rate of 49 bpm, maximum heart rate of 164 bpm, and average heart rate of 74 bpm. Three short runs of SVT, longest 12 beats. Isolated PACs were rare (<1.0%). Isolated PVCs were frequent (6.6%). Triggered and diary events associated with sinus rhythm or with PVCs.  Frequent, potentially symptomatic, PVCs.   CT SCANS  CT CARDIAC SCORING (SELF PAY ONLY) 04/25/2022  Addendum 04/26/2022 11:35 AM ADDENDUM REPORT: 04/26/2022 11:33  EXAM: OVER-READ INTERPRETATION  CT CHEST  The following report is an over-read performed by radiologist Dr. Tanda Spencer of [facility] on 04/26/2022. This over-read does not include interpretation of cardiac or coronary anatomy or pathology. The coronary calcium  score interpretation by the cardiologist is attached.  COMPARISON:  Chest two views 02/16/2020  FINDINGS: Cardiovascular: No aneurysmal dilatation of the visualized proximal ascending aorta or distal descending aorta.  Mediastinum/Nodes: There are no enlarged lymph nodes within the visualized mediastinum.  Lungs/Pleura: There is no pleural effusion. The visualized lungs appear clear.  Upper abdomen: There are two partially visualized parallel linear hyper densities measuring up to approximately 14 mm within the mid stomach. These are nonspecific and may represent pills. No similar high density structure is seen on 02/16/2020 prior chest radiographs or 05/17/2013 remote abdominal CT.  Musculoskeletal/Chest wall: Mild-to-moderate multilevel degenerative disc changes of the visualized  thoracic spine.  IMPRESSION: No significant extracardiac findings within the visualized chest.  There are two parallel, linear, small possible metallic densities within the mid aspect of the stomach that may  represent dense pills 21 or less likely a prior procedure in this region. Recommend clinical correlation.   Electronically Signed By: [name] M.D. On: 04/26/2022 11:33  Narrative CLINICAL DATA:  Cardiovascular Disease Risk stratification  EXAM: Coronary Calcium  Score  TECHNIQUE: A gated, non-contrast computed tomography scan of the heart was performed using 3 mm slice thickness. Axial images were analyzed on a dedicated workstation. Calcium  scoring of the coronary arteries was performed using the Agatston method.  FINDINGS: Coronary arteries: Normal origins.  Coronary Calcium  Score:  Left main: 0  Left anterior descending artery: 123  Left circumflex artery: 45  Right coronary artery: 127  Total: 296  Percentile: 84th  Pericardium: Normal.  Aorta: Normal caliber.  Aortic atherosclerosis.  Posterior mitral annular calcification.  Non-cardiac: See separate report from [facility].  IMPRESSION: 1. Coronary calcium  score of 296. This was 84th percentile for age-, race-, and sex-matched controls. 2. Aortic atherosclerosis. 3. Posterior mitral annular calcification.  RECOMMENDATIONS: Coronary artery calcium  (CAC) score is a strong predictor of incident coronary heart disease (CHD) and provides predictive information beyond traditional risk factors. CAC scoring is reasonable to use in the decision to withhold, postpone, or initiate statin therapy in intermediate-risk or selected borderline-risk asymptomatic adults (age 31-75 years and LDL-C >=70 to <190 mg/dL) who do not have diabetes or established atherosclerotic cardiovascular disease (ASCVD).* In intermediate-risk (10-year ASCVD risk >=7.5% to <20%) adults or selected borderline-risk (10-year ASCVD risk >=5% to <7.5%) adults in whom a CAC score is measured for the purpose of making a treatment decision the following recommendations have been made:  If CAC=0, it is reasonable to withhold  statin therapy and reassess in 5 to 10 years, as long as higher risk conditions are absent (diabetes mellitus, family history of premature CHD in first degree relatives (males <55 years; females <65 years), cigarette smoking, or LDL >=190 mg/dL).  If CAC is 1 to 99, it is reasonable to initiate statin therapy for patients >=101 years of age.  If CAC is >=100 or >=75th percentile, it is reasonable to initiate statin therapy at any age.  Cardiology referral should be considered for patients with CAC scores >=400 or >=75th percentile.  *2018 AHA/ACC/AACVPR/AAPA/ABC/ACPM/ADA/AGS/APhA/ASPC/NLA/PCNA Guideline on the Management of Blood Cholesterol: A Report of the American College of Cardiology/American Heart Association Task Force on Clinical Practice Guidelines. J Am Coll Cardiol. 2019;73(24):3168-3209.  Madison Maxcy, MD  Electronically Signed: By: [name] M.D. On: 04/25/2022 20:42     ______________________________________________________________________________________________        Risk Assessment/Calculations:    {Does this patient have ATRIAL FIBRILLATION?:[phone]}       Physical Exam:    VS:  There were no vitals taken for this visit.   Wt Readings from Last 3 Encounters:  04/21/24 144 lb 12.8 oz (65.7 kg)  04/13/24 142 lb 12.8 oz (64.8 kg)  12/03/23 143 lb (64.9 kg)    Gen: *** distress, *** obese/well nourished/malnourished   Neck: No JVD, *** carotid bruit Ears: *** Frank Sign Cardiac: No Rubs or Gallops, *** Murmur, ***cardia, *** radial pulses Respiratory: Clear to auscultation bilaterally, *** effort, ***  respiratory rate GI: Soft, nontender, non-distended *** MS: No *** edema; *** moves all extremities Integument: Skin feels *** Neuro:  At time of evaluation, alert and oriented to person/place/time/situation *** Psych:  Normal affect, patient feels ***   ASSESSMENT AND PLAN: .    *** An EKG was ordered for *** and shows  ***  Madison Leavens, MD FASE [facility] Cardiologist [facility]  [address] [place], [address] [place], [place] [address] [phone]  8:52 AM

## 2024-06-17 ENCOUNTER — Ambulatory Visit: Attending: Internal Medicine | Admitting: Internal Medicine

## 2024-06-18 ENCOUNTER — Encounter: Payer: Self-pay | Admitting: Internal Medicine

## 2024-06-22 ENCOUNTER — Telehealth: Payer: Self-pay | Admitting: Internal Medicine

## 2024-06-22 NOTE — Telephone Encounter (Signed)
 Spoke with pt, she reports for the last 2 weeks she has been having jaw pain that goes up to her ear. It usually occurs when she is at rest or at night. She also gets SOB with the jaw pain. She has had one episode of nausea. There is nothing that makes the pain worse or better. She did not know about her appointment on 06/17/24. Follow up scheduled

## 2024-06-22 NOTE — Telephone Encounter (Signed)
   Pt c/o Shortness Of Breath: STAT if SOB developed within the last 24 hours or pt is noticeably SOB on the phone  1. Are you currently SOB (can you hear that pt is SOB on the phone)? no  2. How long have you been experiencing SOB? Couple of days   3. Are you SOB when sitting or when up moving around? When sitting   4. Are you currently experiencing any other symptoms? Patient is also experience jaw pains, it comes and goes but its been on going for about week.

## 2024-06-23 ENCOUNTER — Ambulatory Visit: Attending: Physician Assistant | Admitting: Physician Assistant

## 2024-06-23 VITALS — BP 126/70 | HR 55 | Ht 61.0 in | Wt 144.0 lb

## 2024-06-23 DIAGNOSIS — I10 Essential (primary) hypertension: Secondary | ICD-10-CM

## 2024-06-23 DIAGNOSIS — E785 Hyperlipidemia, unspecified: Secondary | ICD-10-CM | POA: Diagnosis not present

## 2024-06-23 DIAGNOSIS — R6884 Jaw pain: Secondary | ICD-10-CM

## 2024-06-23 NOTE — Patient Instructions (Signed)
 Medication Instructions:   Your physician recommends that you continue on your current medications as directed. Please refer to the Current Medication list given to you today.   *If you need a refill on your cardiac medications before your next appointment, please call your pharmacy*     Lab Work:  NONE ORDERED  TODAY     If you have labs (blood work) drawn today and your tests are completely normal, you will receive your results only by: MyChart Message (if you have MyChart) OR A paper copy in the mail If you have any lab test that is abnormal or we need to change your treatment, we will call you to review the results.    Testing/Procedures:  CORONARY CTA : TO RESEARCH ABOUT     Follow-Up: At [facility], you and your health needs are our priority.  As part of our continuing mission to provide you with exceptional heart care, our providers are all part of one team.  This team includes your primary Cardiologist (physician) and Advanced Practice Providers or APPs (Physician Assistants and Nurse Practitioners) who all work together to provide you with the care you need, when you need it.  Your next appointment:    4-6 week(s)  Provider:  Scot Ford PA-C      We recommend signing up for the patient portal called MyChart.  Sign up information is provided on this After Visit Summary.  MyChart is used to connect with patients for Virtual Visits (Telemedicine).  Patients are able to view lab/test results, encounter notes, upcoming appointments, etc.  Non-urgent messages can be sent to your provider as well.   To learn more about what you can do with MyChart, go to [email or web address].   Other Instructions

## 2024-06-23 NOTE — Progress Notes (Unsigned)
 Cardiology Office Note   Date:  06/25/2024  ID:  [name], DOB [birth year], MRN [id number] PCP: McDiarmid, Krystal BIRCH, MD  [facility] Providers Cardiologist:  Stanly DELENA Leavens, MD     History of Present Illness [name] is a 78 y.o. female with past medical history of hypertension, hyperlipidemia, PVCs, coronary calcium  and aortic atherosclerosis.  She has a history of myalgia with statins.  Myoview  obtained in July 2021 showed EF 76%, diffuse T wave inversion during stress, no perfusion abnormality.  Overall considered low risk study.  Heart monitor obtained in August 2023 showed minimal heart rate 49, maximal heart rate 139, average heart rate 67, 5 episode of SVT longest lasting 7 beats.  PVC burden less than 1%.  Coronary calcium  scoring test in August 2023 showed calcium  score of 296 which placed the patient at 84th percentile for age and sex matched control.  Patient was last seen by Dr. Arnetha in July 2024, her PVCs were controlled on metoprolol  succinate 50 mg a.m. and 25 mg p.m.  Patient presents today for evaluation of jaw pain has been going on for the past 2 weeks.  The jaw pain does not have clear correlation with exertion.  It does not radiate anywhere else.  She usually noticed the jaw pain when she lay down at night.  There is no pain when she biting into food.  Palpation does not worsen the jaw pain.  Given her history of elevated calcium  scoring test, I recommend a coronary CTA however she needs premedication for contrast allergy and is worried about side effect associated with steroid.  She says she has taken steroid in the past which caused some facial swelling, however she does not remember how long she took the steroid therapy for.  She does not wish to do Lexiscan  Myoview  as she had a bad experience with the previous Lexiscan  injection that caused some chest pain.  She wished to think about the coronary CTA for now.  I plan to bring the patient back in 6  weeks for reassessment.  ROS:   Patient complained of intermittent jaw pain.  She denies any chest pain, shortness of breath, lower extremity edema, orthopnea or PND.  Studies Reviewed EKG Interpretation Date/Time:  Tuesday June 23 2024 13:43:24 EDT Ventricular Rate:  76 PR Interval:  174 QRS Duration:  76 QT Interval:  396 QTC Calculation: 445 R Axis:   3  Text Interpretation: Normal sinus rhythm Cannot rule out Anterior infarct (cited on or before 16-Feb-2020) When compared with ECG of 28-Jan-2023 15:24, Borderline criteria for Inferior infarct are no longer Present ST no longer depressed in Anterior leads T wave inversion no longer evident in Anterior leads Confirmed by Janene Boer [phone]) on 06/23/2024 1:44:26 PM    Cardiac Studies & Procedures   ______________________________________________________________________________________________   STRESS TESTS  MYOCARDIAL PERFUSION IMAGING 03/22/2020  Interpretation Summary  The left ventricular ejection fraction is hyperdynamic (>65%).  Nuclear stress EF: 76%.  T wave inversion was noted during stress in the II, III, aVF, V3, V4, V5 and V6 leads.  The study is normal.  This is a low risk study.  ST-T nonspecific changes again seen with lexiscan  infusion, similar to prior. On this scan, there is no evidence of perfusion defect at rest or with stress. Low risk study.      MONITORS  LONG TERM MONITOR (3-14 DAYS) 01/07/2024  Narrative   Patient had a minimum heart rate of 49 bpm, maximum heart  rate of 164 bpm, and average heart rate of 74 bpm. Three short runs of SVT, longest 12 beats. Isolated PACs were rare (<1.0%). Isolated PVCs were frequent (6.6%). Triggered and diary events associated with sinus rhythm or with PVCs.  Frequent, potentially symptomatic, PVCs.   CT SCANS  CT CARDIAC SCORING (SELF PAY ONLY) 04/25/2022  Addendum 04/26/2022 11:35 AM ADDENDUM REPORT: 04/26/2022 11:33  EXAM: OVER-READ INTERPRETATION   CT CHEST  The following report is an over-read performed by radiologist Dr. Tanda Lyons of [facility] on 04/26/2022. This over-read does not include interpretation of cardiac or coronary anatomy or pathology. The coronary calcium  score interpretation by the cardiologist is attached.  COMPARISON:  Chest two views 02/16/2020  FINDINGS: Cardiovascular: No aneurysmal dilatation of the visualized proximal ascending aorta or distal descending aorta.  Mediastinum/Nodes: There are no enlarged lymph nodes within the visualized mediastinum.  Lungs/Pleura: There is no pleural effusion. The visualized lungs appear clear.  Upper abdomen: There are two partially visualized parallel linear hyper densities measuring up to approximately 14 mm within the mid stomach. These are nonspecific and may represent pills. No similar high density structure is seen on 02/16/2020 prior chest radiographs or 05/17/2013 remote abdominal CT.  Musculoskeletal/Chest wall: Mild-to-moderate multilevel degenerative disc changes of the visualized thoracic spine.  IMPRESSION: No significant extracardiac findings within the visualized chest.  There are two parallel, linear, small possible metallic densities within the mid aspect of the stomach that may represent dense pills 21 or less likely a prior procedure in this region. Recommend clinical correlation.   Electronically Signed By: Tanda Lyons M.D. On: 04/26/2022 11:33  Narrative CLINICAL DATA:  Cardiovascular Disease Risk stratification  EXAM: Coronary Calcium  Score  TECHNIQUE: A gated, non-contrast computed tomography scan of the heart was performed using 3 mm slice thickness. Axial images were analyzed on a dedicated workstation. Calcium  scoring of the coronary arteries was performed using the Agatston method.  FINDINGS: Coronary arteries: Normal origins.  Coronary Calcium  Score:  Left main: 0  Left anterior descending artery:  123  Left circumflex artery: 45  Right coronary artery: 127  Total: 296  Percentile: 84th  Pericardium: Normal.  Aorta: Normal caliber.  Aortic atherosclerosis.  Posterior mitral annular calcification.  Non-cardiac: See separate report from [facility].  IMPRESSION: 1. Coronary calcium  score of 296. This was 84th percentile for age-, race-, and sex-matched controls. 2. Aortic atherosclerosis. 3. Posterior mitral annular calcification.  RECOMMENDATIONS: Coronary artery calcium  (CAC) score is a strong predictor of incident coronary heart disease (CHD) and provides predictive information beyond traditional risk factors. CAC scoring is reasonable to use in the decision to withhold, postpone, or initiate statin therapy in intermediate-risk or selected borderline-risk asymptomatic adults (age 15-75 years and LDL-C >=70 to <190 mg/dL) who do not have diabetes or established atherosclerotic cardiovascular disease (ASCVD).* In intermediate-risk (10-year ASCVD risk >=7.5% to <20%) adults or selected borderline-risk (10-year ASCVD risk >=5% to <7.5%) adults in whom a CAC score is measured for the purpose of making a treatment decision the following recommendations have been made:  If CAC=0, it is reasonable to withhold statin therapy and reassess in 5 to 10 years, as long as higher risk conditions are absent (diabetes mellitus, family history of premature CHD in first degree relatives (males <55 years; females <65 years), cigarette smoking, or LDL >=190 mg/dL).  If CAC is 1 to 99, it is reasonable to initiate statin therapy for patients >=67 years of age.  If CAC is >=100 or >=75th  percentile, it is reasonable to initiate statin therapy at any age.  Cardiology referral should be considered for patients with CAC scores >=400 or >=75th percentile.  *2018 AHA/ACC/AACVPR/AAPA/ABC/ACPM/ADA/AGS/APhA/ASPC/NLA/PCNA Guideline on the Management of Blood Cholesterol: A Report  of the American College of Cardiology/American Heart Association Task Force on Clinical Practice Guidelines. J Am Coll Cardiol. 2019;73(24):3168-3209.  Vinie Maxcy, MD  Electronically Signed: By: Vinie JAYSON Maxcy M.D. On: 04/25/2022 20:42     ______________________________________________________________________________________________      Risk Assessment/Calculations          Physical Exam VS:  BP 126/70 (BP Location: Left Arm, Patient Position: Sitting, Cuff Size: Normal)   Pulse (!) 55   Ht 5' 1 (1.549 m)   Wt 144 lb (65.3 kg)   SpO2 96%   BMI 27.21 kg/m        Wt Readings from Last 3 Encounters:  06/23/24 144 lb (65.3 kg)  04/21/24 144 lb 12.8 oz (65.7 kg)  04/13/24 142 lb 12.8 oz (64.8 kg)    GEN: Well nourished, well developed in no acute distress NECK: No JVD; No carotid bruits CARDIAC: RRR, no murmurs, rubs, gallops RESPIRATORY:  Clear to auscultation without rales, wheezing or rhonchi  ABDOMEN: Soft, non-tender, non-distended EXTREMITIES:  No edema; No deformity   ASSESSMENT AND PLAN  Jaw pain: Symptom has been intermittent for 2 weeks.  It does not have clear correlation with exertion.  She previously had bad side effect associated with Lexiscan  during stress test, therefore does not wish to do another stress test.  I did recommend a coronary CTA, however she is worried about premedication with steroid  which may cause facial swelling.  I reassured her that short-term use of steroid should be safe.  She wished to think about it for the time being.  I asked her to continue to observe this symptom and if the symptom worsens, I would highly recommend coronary CTA.  Hypertension: Blood pressure stable  Hyperlipidemia: On Repatha        Dispo: Patient can return in 6 weeks for reassessment.  Signed, Scot Ford, PA

## 2024-06-29 ENCOUNTER — Telehealth: Payer: Self-pay | Admitting: Internal Medicine

## 2024-06-29 NOTE — Telephone Encounter (Signed)
 Pt called in and stated [name] wanted her to have a Coronary CTA.  She has some question about this and would like a call back from the nurse  Best number is [phone]

## 2024-06-29 NOTE — Telephone Encounter (Signed)
 Called pt advised of MD response: This is a reasonable assessment: Patietn has hx of elevated calcium  score with new jaw pain. Making sure this is not an anginal equivalent is important. Though her contrast allergy is present, she had a bad experience with Lexiscan - I would hate to repeat that again. This seems to be the best way to conservatively evaluate if her symptoms are cardiac in nature   Pt reports she is more concerned about steroids causing facial swelling.   Reports was previously prescribed a steroid and after about 2 weeks noticed facial swelling.  Advised pt dose of prednisone (steroid) is not big and is only given over 13 hours so shouldn't have facial swelling.  Advised to call back with further questions or concerns.

## 2024-06-29 NOTE — Telephone Encounter (Signed)
 Spoke with Pt. Pt wanted to make sure Dr Santo was on board with testing recommendation. Advised pt I would send other for review. Pt stated understanding

## 2024-07-10 ENCOUNTER — Ambulatory Visit: Admitting: Internal Medicine

## 2024-07-21 ENCOUNTER — Telehealth: Payer: Self-pay | Admitting: Internal Medicine

## 2024-07-21 ENCOUNTER — Telehealth (HOSPITAL_COMMUNITY): Payer: Self-pay | Admitting: Pharmacy Technician

## 2024-07-21 NOTE — Telephone Encounter (Signed)
 Pt c/o medication issue:  1. Name of Medication:   Evolocumab  (REPATHA  SURECLICK) 140 MG/ML SOAJ    2. How are you currently taking this medication (dosage and times per day)?   Inject 140 mg into the skin every 14 (fourteen) days.    3. Are you having a reaction (difficulty breathing--STAT)? No   4. What is your medication issue? Pt tried to pick up rx but was told there was $100 due. She is not sure if the grant has ran out with refills. Please advise.

## 2024-07-21 NOTE — Telephone Encounter (Signed)
   Patient Advocate Encounter   The patient was approved for a [employer] grant that will help cover the cost of repatha  Total amount awarded, 2500.  Effective: 07/13/24 - 07/12/25   APW:[id number] ERW:[id number] Hmnle:[id number] PI:[id number] [employer] ID: [id number]   Pharmacy provided with approval and processing information. Patient informed via mychart

## 2024-07-21 NOTE — Telephone Encounter (Signed)
 Grant approved. Patient and pharmacy aware

## 2024-07-28 ENCOUNTER — Encounter: Payer: Self-pay | Admitting: Physician Assistant

## 2024-07-28 ENCOUNTER — Ambulatory Visit: Attending: Physician Assistant | Admitting: Physician Assistant

## 2024-07-28 ENCOUNTER — Other Ambulatory Visit: Payer: Self-pay | Admitting: Pharmacist

## 2024-07-28 ENCOUNTER — Other Ambulatory Visit (HOSPITAL_COMMUNITY): Payer: Self-pay

## 2024-07-28 VITALS — BP 132/70 | HR 69 | Ht 62.0 in | Wt 142.0 lb

## 2024-07-28 DIAGNOSIS — R682 Dry mouth, unspecified: Secondary | ICD-10-CM | POA: Diagnosis not present

## 2024-07-28 DIAGNOSIS — R6884 Jaw pain: Secondary | ICD-10-CM | POA: Diagnosis not present

## 2024-07-28 DIAGNOSIS — H04123 Dry eye syndrome of bilateral lacrimal glands: Secondary | ICD-10-CM

## 2024-07-28 DIAGNOSIS — E785 Hyperlipidemia, unspecified: Secondary | ICD-10-CM

## 2024-07-28 DIAGNOSIS — R0600 Dyspnea, unspecified: Secondary | ICD-10-CM

## 2024-07-28 DIAGNOSIS — Z79899 Other long term (current) drug therapy: Secondary | ICD-10-CM

## 2024-07-28 MED ORDER — REPATHA SURECLICK 140 MG/ML ~~LOC~~ SOAJ
140.0000 mg | SUBCUTANEOUS | 3 refills | Status: DC
  Filled 2024-07-28: qty 6, 84d supply, fill #0
  Filled 2024-10-23: qty 6, 84d supply, fill #1

## 2024-07-28 NOTE — Progress Notes (Unsigned)
 Cardiology Office Note   Date:  07/29/2024  ID:  [name], DOB Jan 29, 1946, MRN [id number] PCP: McDiarmid, Krystal BIRCH, MD  [facility] Providers Cardiologist:  Stanly DELENA Leavens, MD     History of Present Illness [name] is a 78 y.o. female with past medical history of hypertension, hyperlipidemia, PVCs, coronary calcium  and aortic atherosclerosis. She has a history of myalgia with statins. Myoview  obtained in July 2021 showed EF 76%, diffuse T wave inversion during stress, no perfusion abnormality. Overall considered low risk study. Heart monitor obtained in August 2023 showed minimal heart rate 49, maximal heart rate 139, average heart rate 67, 5 episode of SVT longest lasting 7 beats. PVC burden less than 1%. Coronary calcium  scoring test in August 2023 showed calcium  score of 296 which placed the patient at 84th percentile for age and sex matched control. Patient was last seen by Dr. Arnetha in July 2024, her PVCs were controlled on metoprolol  succinate 50 mg a.m. and 25 mg p.m.   I last saw the patient on 06/23/2024 for evaluation of jaw pain.  Jaw pain did not have clear correlation with exertion.  She usually noticed the jaw pain when she lay down at night.  There was no pain when she biting into food.  Palpation does not worsen the jaw pain either.  Given her history of elevated coronary calcium  score, I recommend a coronary CTA however she required premedication for contrast allergies and is worried about the side effect associated with steroid.  She has bad experience with Lexiscan  Myoview  therefore does not wish to do Lexiscan  either.  Patient presents today for follow-up.  Her jaw pain has significantly improved and now rarely happens.  She denies any chest pain.  She has chronic dyspnea on exertion that is been going on for the past year.  I will order echocardiogram.  She complains of dry eye and dry mouth, her daughter has lupus, I will order a ANA, SSA/SSB to  rule out Sjogren's disorder.  She is due for fasting lipid panel.  If she does have neuropathy involving her toes, however hemoglobin A1c last year was normal.  I will obtain CBC and CMP.  If blood work and echocardiogram is normal, I recommend follow-up with Dr. Arnetha in 6 to 8 months.  ROS:   Patient's jaw pain has significantly improved.  She denies any chest pain.  She has chronic dyspnea on exertion.  Studies Reviewed      Cardiac Studies & Procedures   ______________________________________________________________________________________________   STRESS TESTS  MYOCARDIAL PERFUSION IMAGING 03/22/2020  Interpretation Summary  The left ventricular ejection fraction is hyperdynamic (>65%).  Nuclear stress EF: 76%.  T wave inversion was noted during stress in the II, III, aVF, V3, V4, V5 and V6 leads.  The study is normal.  This is a low risk study.  ST-T nonspecific changes again seen with lexiscan  infusion, similar to prior. On this scan, there is no evidence of perfusion defect at rest or with stress. Low risk study.      MONITORS  LONG TERM MONITOR (3-14 DAYS) 01/07/2024  Narrative   Patient had a minimum heart rate of 49 bpm, maximum heart rate of 164 bpm, and average heart rate of 74 bpm. Three short runs of SVT, longest 12 beats. Isolated PACs were rare (<1.0%). Isolated PVCs were frequent (6.6%). Triggered and diary events associated with sinus rhythm or with PVCs.  Frequent, potentially symptomatic, PVCs.   CT SCANS  CT  CARDIAC SCORING (SELF PAY ONLY) 04/25/2022  Addendum 04/26/2022 11:35 AM ADDENDUM REPORT: 04/26/2022 11:33  EXAM: OVER-READ INTERPRETATION  CT CHEST  The following report is an over-read performed by radiologist Dr. Tanda Lyons of [facility] on 04/26/2022. This over-read does not include interpretation of cardiac or coronary anatomy or pathology. The coronary calcium  score interpretation by the cardiologist is  attached.  COMPARISON:  Chest two views 02/16/2020  FINDINGS: Cardiovascular: No aneurysmal dilatation of the visualized proximal ascending aorta or distal descending aorta.  Mediastinum/Nodes: There are no enlarged lymph nodes within the visualized mediastinum.  Lungs/Pleura: There is no pleural effusion. The visualized lungs appear clear.  Upper abdomen: There are two partially visualized parallel linear hyper densities measuring up to approximately 14 mm within the mid stomach. These are nonspecific and may represent pills. No similar high density structure is seen on 02/16/2020 prior chest radiographs or 05/17/2013 remote abdominal CT.  Musculoskeletal/Chest wall: Mild-to-moderate multilevel degenerative disc changes of the visualized thoracic spine.  IMPRESSION: No significant extracardiac findings within the visualized chest.  There are two parallel, linear, small possible metallic densities within the mid aspect of the stomach that may represent dense pills 21 or less likely a prior procedure in this region. Recommend clinical correlation.   Electronically Signed By: Tanda Lyons M.D. On: 04/26/2022 11:33  Narrative CLINICAL DATA:  Cardiovascular Disease Risk stratification  EXAM: Coronary Calcium  Score  TECHNIQUE: A gated, non-contrast computed tomography scan of the heart was performed using 3 mm slice thickness. Axial images were analyzed on a dedicated workstation. Calcium  scoring of the coronary arteries was performed using the Agatston method.  FINDINGS: Coronary arteries: Normal origins.  Coronary Calcium  Score:  Left main: 0  Left anterior descending artery: 123  Left circumflex artery: 45  Right coronary artery: 127  Total: 296  Percentile: 84th  Pericardium: Normal.  Aorta: Normal caliber.  Aortic atherosclerosis.  Posterior mitral annular calcification.  Non-cardiac: See separate report from [facility].  IMPRESSION: 1. Coronary calcium  score of 296. This was 84th percentile for age-, race-, and sex-matched controls. 2. Aortic atherosclerosis. 3. Posterior mitral annular calcification.  RECOMMENDATIONS: Coronary artery calcium  (CAC) score is a strong predictor of incident coronary heart disease (CHD) and provides predictive information beyond traditional risk factors. CAC scoring is reasonable to use in the decision to withhold, postpone, or initiate statin therapy in intermediate-risk or selected borderline-risk asymptomatic adults (age 1-75 years and LDL-C >=70 to <190 mg/dL) who do not have diabetes or established atherosclerotic cardiovascular disease (ASCVD).* In intermediate-risk (10-year ASCVD risk >=7.5% to <20%) adults or selected borderline-risk (10-year ASCVD risk >=5% to <7.5%) adults in whom a CAC score is measured for the purpose of making a treatment decision the following recommendations have been made:  If CAC=0, it is reasonable to withhold statin therapy and reassess in 5 to 10 years, as long as higher risk conditions are absent (diabetes mellitus, family history of premature CHD in first degree relatives (males <55 years; females <65 years), cigarette smoking, or LDL >=190 mg/dL).  If CAC is 1 to 99, it is reasonable to initiate statin therapy for patients >=18 years of age.  If CAC is >=100 or >=75th percentile, it is reasonable to initiate statin therapy at any age.  Cardiology referral should be considered for patients with CAC scores >=400 or >=75th percentile.  *2018 AHA/ACC/AACVPR/AAPA/ABC/ACPM/ADA/AGS/APhA/ASPC/NLA/PCNA Guideline on the Management of Blood Cholesterol: A Report of the [employer] of [employer]/American Heart Association Task Force on Clinical Practice  Guidelines. J Am Coll Cardiol. 2019;73(24):3168-3209.  Vinie Maxcy, MD  Electronically Signed: By: Vinie JAYSON Maxcy M.D. On: 04/25/2022 20:42      ______________________________________________________________________________________________      Risk Assessment/Calculations           Physical Exam VS:  BP 132/70   Pulse 69   Ht 5' 2 (1.575 m)   Wt 142 lb (64.4 kg)   SpO2 96%   BMI 25.97 kg/m        Wt Readings from Last 3 Encounters:  07/28/24 142 lb (64.4 kg)  06/23/24 144 lb (65.3 kg)  04/21/24 144 lb 12.8 oz (65.7 kg)    GEN: Well nourished, well developed in no acute distress NECK: No JVD; No carotid bruits CARDIAC: RRR, no murmurs, rubs, gallops RESPIRATORY:  Clear to auscultation without rales, wheezing or rhonchi  ABDOMEN: Soft, non-tender, non-distended EXTREMITIES:  No edema; No deformity   ASSESSMENT AND PLAN  Jaw pain: I previously recommend a coronary CTA, however patient is hesitant to use steroid premedication for contrast allergy.  She had very bad experience with previous stress test.  We opted to continue monitoring.  Since the last visit, her jaw pain has improved.  He does not have clear correlation with exertion.  Therefore we decided to continue observation  Dyspnea on exertion: Will obtain echocardiogram  Dry mouth and dry eyes: Obtain ANA and SSA/SSB to rule out rheumatological disorder.  Hyperlipidemia: On Repatha .       Dispo: Follow-up with Dr. Arnetha in 6 to 61-month.  Signed, Scot Ford, PA

## 2024-07-28 NOTE — Patient Instructions (Addendum)
 Medication Instructions:  Your physician recommends that you continue on your current medications as directed. Please refer to the Current Medication list given to you today.  *If you need a refill on your cardiac medications before your next appointment, please call your pharmacy*  Lab Work: TO BE DONE IN THE NEXT COUPLE OF WEEKS: CMET, CBC, SSA, SSB, LIPID PANEL, ANA If you have labs (blood work) drawn today and your tests are completely normal, you will receive your results only by: MyChart Message (if you have MyChart) OR A paper copy in the mail If you have any lab test that is abnormal or we need to change your treatment, we will call you to review the results.  Testing/Procedures: Your physician has requested that you have an echocardiogram. Echocardiography is a painless test that uses sound waves to create images of your heart. It provides your doctor with information about the size and shape of your heart and how well your heart's chambers and valves are working. This procedure takes approximately one hour. There are no restrictions for this procedure. Please do NOT wear cologne, perfume, aftershave, or lotions (deodorant is allowed). Please arrive 15 minutes prior to your appointment time.  Please note: We ask at that you not bring children with you during ultrasound (echo/ vascular) testing. Due to room size and safety concerns, children are not allowed in the ultrasound rooms during exams. Our front office staff cannot provide observation of children in our lobby area while testing is being conducted. An adult accompanying a patient to their appointment will only be allowed in the ultrasound room at the discretion of the ultrasound technician under special circumstances. We apologize for any inconvenience.   Follow-Up: At [facility], you and your health needs are our priority.  As part of our continuing mission to provide you with exceptional heart care, our providers are  all part of one team.  This team includes your primary Cardiologist (physician) and Advanced Practice Providers or APPs (Physician Assistants and Nurse Practitioners) who all work together to provide you with the care you need, when you need it.  Your next appointment:   6-8 month(s)  Provider:   Stanly DELENA Leavens, MD   We recommend signing up for the patient portal called MyChart.  Sign up information is provided on this After Visit Summary.  MyChart is used to connect with patients for Virtual Visits (Telemedicine).  Patients are able to view lab/test results, encounter notes, upcoming appointments, etc.  Non-urgent messages can be sent to your provider as well.   To learn more about what you can do with MyChart, go to [email or web address].

## 2024-08-04 LAB — CBC

## 2024-08-05 ENCOUNTER — Ambulatory Visit: Payer: Self-pay | Admitting: Physician Assistant

## 2024-08-05 LAB — CBC
Hematocrit: 31.4 % — AB (ref 34.0–46.6)
Hemoglobin: 10.3 g/dL — AB (ref 11.1–15.9)
MCH: 32.2 pg (ref 26.6–33.0)
MCHC: 32.8 g/dL (ref 31.5–35.7)
MCV: 98 fL — AB (ref 79–97)
Platelets: 344 x10E3/uL (ref 150–450)
RBC: 3.2 x10E6/uL — AB (ref 3.77–5.28)
RDW: 12.5 % (ref 11.7–15.4)
WBC: 4.9 x10E3/uL (ref 3.4–10.8)

## 2024-08-05 LAB — COMPREHENSIVE METABOLIC PANEL WITH GFR
ALT: 14 IU/L (ref 0–32)
AST: 20 IU/L (ref 0–40)
Albumin: 3.7 g/dL — ABNORMAL LOW (ref 3.8–4.8)
Alkaline Phosphatase: 82 IU/L (ref 49–135)
BUN/Creatinine Ratio: 10 — ABNORMAL LOW (ref 12–28)
BUN: 12 mg/dL (ref 8–27)
Bilirubin Total: 0.4 mg/dL (ref 0.0–1.2)
CO2: 19 mmol/L — ABNORMAL LOW (ref 20–29)
Calcium: 9.4 mg/dL (ref 8.7–10.3)
Chloride: 112 mmol/L — ABNORMAL HIGH (ref 96–106)
Creatinine, Ser: 1.19 mg/dL — ABNORMAL HIGH (ref 0.57–1.00)
Globulin, Total: 2.7 g/dL (ref 1.5–4.5)
Glucose: 91 mg/dL (ref 70–99)
Potassium: 3.7 mmol/L (ref 3.5–5.2)
Sodium: 144 mmol/L (ref 134–144)
Total Protein: 6.4 g/dL (ref 6.0–8.5)
eGFR: 47 mL/min/1.73 — ABNORMAL LOW (ref >59)

## 2024-08-05 LAB — ANA

## 2024-08-05 LAB — LIPID PANEL
Chol/HDL Ratio: 3 ratio (ref 0.0–4.4)
Cholesterol, Total: 194 mg/dL (ref 100–199)
HDL: 64 mg/dL (ref >39)
LDL Chol Calc (NIH): 117 mg/dL — ABNORMAL HIGH (ref 0–99)
Triglycerides: 70 mg/dL (ref 0–149)
VLDL Cholesterol Cal: 13 mg/dL (ref 5–40)

## 2024-08-05 LAB — SJOGRENS SYNDROME-A EXTRACTABLE NUCLEAR ANTIBODY: ENA SSA (RO) Ab: <0.2 (ref 0.0–0.9)

## 2024-08-05 LAB — SJOGRENS SYNDROME-B EXTRACTABLE NUCLEAR ANTIBODY: ENA SSB (LA) Ab: <0.2 (ref 0.0–0.9)

## 2024-08-05 NOTE — Progress Notes (Signed)
 Stable renal function and electrolyte. Normal liver enzyme. Stable red blood cell count. Rheumatologic work up including sjogrens came back negative.

## 2024-08-11 ENCOUNTER — Telehealth: Payer: Self-pay | Admitting: Internal Medicine

## 2024-08-11 NOTE — Telephone Encounter (Signed)
 Results given and Pt reminded of Echo. Pt stated understanding.

## 2024-08-11 NOTE — Telephone Encounter (Signed)
Patient would like a call regarding her lab results

## 2024-09-02 ENCOUNTER — Ambulatory Visit (HOSPITAL_COMMUNITY): Admission: RE | Admit: 2024-09-02 | Discharge: 2024-09-02 | Attending: Cardiology | Admitting: Cardiology

## 2024-09-02 DIAGNOSIS — R0600 Dyspnea, unspecified: Secondary | ICD-10-CM

## 2024-09-02 LAB — ECHOCARDIOGRAM COMPLETE

## 2024-10-20 ENCOUNTER — Telehealth: Payer: Self-pay | Admitting: Internal Medicine

## 2024-10-20 NOTE — Telephone Encounter (Signed)
 Pt c/o medication issue:  1. Name of Medication: Evolocumab  (REPATHA  SURECLICK) 140 MG/ML SOAJ   2. How are you currently taking this medication (dosage and times per day)?    3. Are you having a reaction (difficulty breathing--STAT)? no  4. What is your medication issue? Patient calling in about receiving a grant for this medication. Please advise

## 2024-10-21 ENCOUNTER — Other Ambulatory Visit (HOSPITAL_COMMUNITY): Payer: Self-pay

## 2024-10-21 ENCOUNTER — Telehealth: Payer: Self-pay | Admitting: Pharmacy Technician

## 2024-10-21 NOTE — Telephone Encounter (Signed)
 Effective: 07/13/24 - 07/12/25   APW:[id number] ERW:[id number] Hmnle:[id number] PI:[id number]   Repatha  [name]

## 2024-10-23 ENCOUNTER — Other Ambulatory Visit (HOSPITAL_COMMUNITY): Payer: Self-pay

## 2024-10-23 MED ORDER — REPATHA SURECLICK 140 MG/ML ~~LOC~~ SOAJ
140.0000 mg | SUBCUTANEOUS | 3 refills | Status: AC
  Filled 2024-10-23: qty 6, 84d supply, fill #0

## 2024-10-23 NOTE — Addendum Note (Signed)
 Addended by: Rudy Luhmann K on: 10/23/2024 05:08 PM   Modules accepted: Orders

## 2024-10-23 NOTE — Telephone Encounter (Signed)
 Spoke to patient she is ok to get prescription from [place] - will get it delivered.

## 2024-10-23 NOTE — Telephone Encounter (Signed)
 Pt is calling checking status of this message. She said [facility] still does not have the information. Please advise [id number] [place], [place] - [id number] Phone: [phone]  Fax: [phone]

## 2025-04-15 ENCOUNTER — Encounter
